# Patient Record
Sex: Female | Born: 1940 | Race: White | Hispanic: No | Marital: Married | Smoking: Never smoker
Health system: Southern US, Community
[De-identification: ages and names within clinical notes are randomized; demographics above are authoritative.]

## PROBLEM LIST (undated history)

## (undated) DIAGNOSIS — I4901 Ventricular fibrillation: Secondary | ICD-10-CM

## (undated) DIAGNOSIS — I2510 Atherosclerotic heart disease of native coronary artery without angina pectoris: Secondary | ICD-10-CM

## (undated) DIAGNOSIS — J40 Bronchitis, not specified as acute or chronic: Secondary | ICD-10-CM

## (undated) DIAGNOSIS — R7303 Prediabetes: Secondary | ICD-10-CM

## (undated) DIAGNOSIS — M8580 Other specified disorders of bone density and structure, unspecified site: Secondary | ICD-10-CM

## (undated) DIAGNOSIS — E079 Disorder of thyroid, unspecified: Secondary | ICD-10-CM

## (undated) DIAGNOSIS — E039 Hypothyroidism, unspecified: Secondary | ICD-10-CM

## (undated) DIAGNOSIS — I214 Non-ST elevation (NSTEMI) myocardial infarction: Secondary | ICD-10-CM

## (undated) DIAGNOSIS — I10 Essential (primary) hypertension: Secondary | ICD-10-CM

## (undated) DIAGNOSIS — F419 Anxiety disorder, unspecified: Secondary | ICD-10-CM

## (undated) HISTORY — PX: CHOLECYSTECTOMY: SHX55

## (undated) HISTORY — DX: Disorder of thyroid, unspecified: E07.9

## (undated) HISTORY — PX: TONSILLECTOMY: SUR1361

## (undated) HISTORY — PX: CORONARY ANGIOPLASTY WITH STENT PLACEMENT: SHX49

## (undated) HISTORY — PX: APPENDECTOMY: SHX54

## (undated) HISTORY — DX: Other specified disorders of bone density and structure, unspecified site: M85.80

## (undated) HISTORY — DX: Essential (primary) hypertension: I10

---

## 2000-12-15 ENCOUNTER — Emergency Department (HOSPITAL_COMMUNITY): Admission: EM | Admit: 2000-12-15 | Discharge: 2000-12-15 | Payer: Self-pay | Admitting: Emergency Medicine

## 2000-12-20 ENCOUNTER — Encounter: Payer: Self-pay | Admitting: General Surgery

## 2000-12-20 ENCOUNTER — Observation Stay (HOSPITAL_COMMUNITY): Admission: RE | Admit: 2000-12-20 | Discharge: 2000-12-21 | Payer: Self-pay | Admitting: General Surgery

## 2000-12-20 ENCOUNTER — Encounter (INDEPENDENT_AMBULATORY_CARE_PROVIDER_SITE_OTHER): Payer: Self-pay | Admitting: Specialist

## 2001-12-21 ENCOUNTER — Encounter: Admission: RE | Admit: 2001-12-21 | Discharge: 2001-12-21 | Payer: Self-pay | Admitting: *Deleted

## 2001-12-21 ENCOUNTER — Encounter: Payer: Self-pay | Admitting: *Deleted

## 2002-05-05 ENCOUNTER — Other Ambulatory Visit: Admission: RE | Admit: 2002-05-05 | Discharge: 2002-05-05 | Payer: Self-pay | Admitting: Obstetrics and Gynecology

## 2002-07-17 ENCOUNTER — Encounter: Admission: RE | Admit: 2002-07-17 | Discharge: 2002-07-17 | Payer: Self-pay | Admitting: Internal Medicine

## 2002-07-17 ENCOUNTER — Encounter: Payer: Self-pay | Admitting: Internal Medicine

## 2003-09-18 ENCOUNTER — Emergency Department (HOSPITAL_COMMUNITY): Admission: EM | Admit: 2003-09-18 | Discharge: 2003-09-18 | Payer: Self-pay | Admitting: Emergency Medicine

## 2009-03-13 ENCOUNTER — Emergency Department (HOSPITAL_BASED_OUTPATIENT_CLINIC_OR_DEPARTMENT_OTHER): Admission: EM | Admit: 2009-03-13 | Discharge: 2009-03-13 | Payer: Self-pay | Admitting: Emergency Medicine

## 2009-03-13 ENCOUNTER — Ambulatory Visit: Payer: Self-pay | Admitting: Diagnostic Radiology

## 2009-09-19 ENCOUNTER — Ambulatory Visit: Payer: Self-pay | Admitting: Diagnostic Radiology

## 2009-09-19 ENCOUNTER — Emergency Department (HOSPITAL_BASED_OUTPATIENT_CLINIC_OR_DEPARTMENT_OTHER): Admission: EM | Admit: 2009-09-19 | Discharge: 2009-09-19 | Payer: Self-pay | Admitting: Emergency Medicine

## 2010-06-20 NOTE — Op Note (Signed)
[facility]  Patient:    Cathy Reilly, Cathy Reilly Visit Number: [id number] MRN: [id number]          Service Type: SUR Location: [id number] Attending Physician:  Carson Myrtle Dictated by:   Sheppard Plumber Earlene Plater, M.D. Proc. Date: 12/20/00 Admit Date:  12/20/2000                             Operative Report  PREOPERATIVE DIAGNOSIS:  Cholecystolithiasis.  POSTOPERATIVE DIAGNOSIS:  Cholecystolithiasis.  PROCEDURE:  Laparoscopic cholecystectomy and operative cholangiogram.  SURGEON:  Timothy E. Earlene Plater, M.D.  ANESTHESIA:  General.  INDICATIONS:  Cathy Reilly is a 59.  Has had some right upper quadrant pain, food related.  Had a severe attack last Wednesday in the emergency room that subsided and she is now ready to proceed with a laparoscopic cholecystectomy which has been carefully explained.  DESCRIPTION OF PROCEDURE:  The patient was seen in the holding area, evaluated by anesthesia.  Laboratory data today shows a slight elevation of the bilirubin.  The patient was taken to the operating room, placed supine.  General endotracheal anesthesia administered.  The abdomen was scrubbed, prepped and draped in the usual fashion.  Marcaine 25% with epinephrine is used throughout prior to each incision.  An infraumbilical incision was made horizontally. The fascia was identified, opened vertically.  The peritoneum was entered without complications.  The Hasson catheter was placed and tied in place with a #1 Vicryl.  The abdomen was insufflated.  Peritoneoscopy showed no changes. There were adhesions just inferior to the umbilicus.  A 10 mm trocar was placed in the mid epigastrium.  Two 5 mm trocars were placed in the right upper quadrant.  The gallbladder was slightly tense.  It was grasped at the dome, placed on tension.  The adhesions of the omentum were taken down bluntly.  The gallbladder was then viewed in its entirety.  The infundibulum of the gallbladder  was then grasped and careful dissection of the fatty tissue around the base of the gallbladder revealed a normal-appearing cystic duct entering the gallbladder.  Behind that was the anterior and posterior branch of the cystic artery.  The camera was placed in the mid epigastric position and the omental adhesions to the lower midline appeared rather innocent. After careful dissection of the base of the gallbladder, the junction of the gallbladder and cystic duct was clipped.  Small incision made in the cystic duct and then the _____ catheter was inserted into the remnant of the cystic duct and clamped in place.  A real time cholangiogram was made by injecting half-strength Hypaque.  The biliary system filled rapidly and completely.  I detected no foreign body, evidence of stone or abnormalities.  Likewise it flowed distally and into the duodenum without apparent abnormality.  With this, the tip and catheter were removed.  The stump of the cystic duct was triply clipped and then the duct was divided through.  Both the anterior and posterior branches of the cystic artery were individually dissected, triply clipped and divided.  The gallbladder was then dissected from the gallbladder bed without incident or complications.  There was no bile leak and no bleeding.  Careful inspection and irrigation were carried out.  The gallbladder was then removed through the infraumbilical incision.  It was passed off the field and submitted for pathology.  Irrigant was clear.  The bed of the gallbladder inspected.  No abnormalities noted.  The adhesions to the lower midline were bluntly taken down and freed from the anterior abdominal wall.  There was no bleeding.  With this, the procedure was complete.  All irrigant, CO2, instruments and trocars were removed.  The abdomen was cleansed and the larger incisions closed with interrupted 3-0 Monocryl and all incisions closed with Steri-Strips.  She tolerated it  well. Was awakened and taken to the recovery room in good condition. Dictated by:   Sheppard Plumber Earlene Plater, M.D. Attending Physician:  Carson Myrtle DD:  12/20/00 TD:  12/20/00 Job: [phone]

## 2014-01-11 ENCOUNTER — Ambulatory Visit (INDEPENDENT_AMBULATORY_CARE_PROVIDER_SITE_OTHER): Payer: Commercial Managed Care - HMO | Admitting: Internal Medicine

## 2014-01-11 ENCOUNTER — Encounter: Payer: Self-pay | Admitting: Internal Medicine

## 2014-01-11 VITALS — BP 154/66 | HR 69 | Ht 61.0 in | Wt 154.2 lb

## 2014-01-11 DIAGNOSIS — R0989 Other specified symptoms and signs involving the circulatory and respiratory systems: Secondary | ICD-10-CM

## 2014-01-11 NOTE — Patient Instructions (Signed)
Your physician recommends that you continue on your current medications as directed. Please refer to the Current Medication list given to you today. Your physician has requested that you have a carotid duplex. This test is an ultrasound of the carotid arteries in your neck. It looks at blood flow through these arteries that supply the brain with blood. Allow one hour for this exam. There are no restrictions or special instructions.

## 2014-01-11 NOTE — Progress Notes (Signed)
HPI Patinet is a 73 yo who was referred for evaluation of dyslipidemia THe patient was seen recently in clinic  Chol panel showed LDL171 Trig 158  HDL 41  Total chol 243 Patient has a history of HTN (treated),hypothyroidism She denies CP  No SOB  No palpitations  Active  Patient reports trying crestor, lipitro smvistatin and welchol in past  PRob with aches Cathy Reilly  Not sure what rxn was  It was expensive Allergies  Allergen Reactions  . Codeine   . Crestor [Rosuvastatin] Other (See Comments)    myaglia  . Lipitor [Atorvastatin] Other (See Comments)    myaglia  . Septra [Sulfamethoxazole-Trimethoprim]   . Simvastatin Other (See Comments)    myaglia  . Welchol [Colesevelam Hcl] Other (See Comments)    myaglia    Current Outpatient Prescriptions  Medication Sig Dispense Refill  . amLODipine (NORVASC) 5 MG tablet Take 5 mg by mouth daily.    Marland Kitchen aspirin 81 MG tablet Take 81 mg by mouth daily.    . calcium citrate-vitamin D (CITRACAL+D) 315-200 MG-UNIT per tablet Take 1 tablet by mouth 2 (two) times daily.    . cholecalciferol (VITAMIN D) 1000 UNITS tablet Take 1,000 Units by mouth daily.    . Coenzyme Q10 (CO Q-10) 100 MG CAPS Take by mouth.    . levothyroxine (SYNTHROID, LEVOTHROID) 75 MCG tablet Take 75 mcg by mouth daily before breakfast.    . lisinopril-hydrochlorothiazide (PRINZIDE,ZESTORETIC) 20-12.5 MG per tablet Take 1 tablet by mouth daily.    . Omega 3 1000 MG CAPS Take by mouth.     No current facility-administered medications for this visit.    Past Medical History  Diagnosis Date  . Hypertension   . Thyroid disease   . Osteopenia     hip    Past Surgical History  Procedure Laterality Date  . Cholecystectomy    . Appendectomy    . Tonsillectomy      Family History  Problem Relation Age of Onset  . Hypertension Mother   . Hypertension Father   . Hypertension Sister   . Cancer Sister   . Hypertension Brother   . Cancer Brother   . CVA Brother   .  Transient ischemic attack Sister     History   Social History  . Marital Status: Married    Spouse Name: N/A    Number of Children: N/A  . Years of Education: N/A   Occupational History  . Not on file.   Social History Main Topics  . Smoking status: Never Smoker   . Smokeless tobacco: Not on file  . Alcohol Use: Not on file  . Drug Use: Not on file  . Sexual Activity: Not on file   Other Topics Concern  . Not on file   Social History Narrative    Review of Systems:  All systems reviewed.  They are negative to the above problem except as previously stated.  Vital Signs: BP 154/66 mmHg  Pulse 69  Ht 5\' 1"  (1.549 m)  Wt 154 lb 3.2 oz (69.945 kg)  BMI 29.15 kg/m2  SpO2 98%  Physical Exam  HEENT:  Normocephalic, atraumatic. EOMI, PERRLA.  Neck: JVP is normal.  No bruits.  Lungs: clear to auscultation. No rales no wheezes.  Heart: Regular rate and rhythm. Normal S1, S2. No S3.   No significant murmurs. PMI not displaced.  Abdomen:  Supple, nontender. Normal bowel sounds. No masses. No hepatomegaly.  Extremities:   Good distal  pulses throughout. No lower extremity edema.  Musculoskeletal :moving all extremities.  Neuro:   alert and oriented x3.  CN II-XII grossly intact.  EKG  06/09/13:  SR  64 bpm  Assessment and Plan:  1.  Dyslipidemia  I have reviewed previous xrays  She had a CXR in past that showed calcification of the aortic knob  THis is sugg of atherosclerosis I would recomm carotid USN to confirm If indeed plaquing will need evaluate Rx  2.  HTN  BP today is higher than usual she says  It was good in medicine clnic  Follow  F/U to be determinedcc

## 2014-01-17 ENCOUNTER — Ambulatory Visit (HOSPITAL_COMMUNITY): Payer: Commercial Managed Care - HMO | Attending: Cardiology | Admitting: *Deleted

## 2014-01-17 DIAGNOSIS — E785 Hyperlipidemia, unspecified: Secondary | ICD-10-CM | POA: Insufficient documentation

## 2014-01-17 DIAGNOSIS — I6523 Occlusion and stenosis of bilateral carotid arteries: Secondary | ICD-10-CM | POA: Insufficient documentation

## 2014-01-17 DIAGNOSIS — R0989 Other specified symptoms and signs involving the circulatory and respiratory systems: Secondary | ICD-10-CM | POA: Diagnosis not present

## 2014-01-17 NOTE — Progress Notes (Signed)
Carotid duplex complete 

## 2014-03-01 DIAGNOSIS — J0100 Acute maxillary sinusitis, unspecified: Secondary | ICD-10-CM | POA: Diagnosis not present

## 2014-03-23 ENCOUNTER — Encounter (HOSPITAL_COMMUNITY): Payer: Self-pay | Admitting: Emergency Medicine

## 2014-03-23 ENCOUNTER — Emergency Department (HOSPITAL_COMMUNITY)
Admission: EM | Admit: 2014-03-23 | Discharge: 2014-03-23 | Disposition: A | Discharge status: Home / Self Care | Payer: Commercial Managed Care - HMO | Attending: Emergency Medicine | Admitting: Emergency Medicine

## 2014-03-23 ENCOUNTER — Emergency Department (HOSPITAL_COMMUNITY): Payer: Commercial Managed Care - HMO

## 2014-03-23 DIAGNOSIS — I10 Essential (primary) hypertension: Secondary | ICD-10-CM | POA: Diagnosis not present

## 2014-03-23 DIAGNOSIS — S52572A Other intraarticular fracture of lower end of left radius, initial encounter for closed fracture: Secondary | ICD-10-CM | POA: Diagnosis not present

## 2014-03-23 DIAGNOSIS — M8580 Other specified disorders of bone density and structure, unspecified site: Secondary | ICD-10-CM | POA: Diagnosis not present

## 2014-03-23 DIAGNOSIS — Y9389 Activity, other specified: Secondary | ICD-10-CM | POA: Insufficient documentation

## 2014-03-23 DIAGNOSIS — Y9289 Other specified places as the place of occurrence of the external cause: Secondary | ICD-10-CM | POA: Diagnosis not present

## 2014-03-23 DIAGNOSIS — S6992XA Unspecified injury of left wrist, hand and finger(s), initial encounter: Secondary | ICD-10-CM | POA: Diagnosis present

## 2014-03-23 DIAGNOSIS — E079 Disorder of thyroid, unspecified: Secondary | ICD-10-CM | POA: Diagnosis not present

## 2014-03-23 DIAGNOSIS — E039 Hypothyroidism, unspecified: Secondary | ICD-10-CM | POA: Diagnosis not present

## 2014-03-23 DIAGNOSIS — S52592A Other fractures of lower end of left radius, initial encounter for closed fracture: Secondary | ICD-10-CM | POA: Diagnosis not present

## 2014-03-23 DIAGNOSIS — Z792 Long term (current) use of antibiotics: Secondary | ICD-10-CM | POA: Diagnosis not present

## 2014-03-23 DIAGNOSIS — S52502A Unspecified fracture of the lower end of left radius, initial encounter for closed fracture: Secondary | ICD-10-CM

## 2014-03-23 DIAGNOSIS — Z79899 Other long term (current) drug therapy: Secondary | ICD-10-CM | POA: Insufficient documentation

## 2014-03-23 DIAGNOSIS — Z7982 Long term (current) use of aspirin: Secondary | ICD-10-CM | POA: Diagnosis not present

## 2014-03-23 DIAGNOSIS — Y998 Other external cause status: Secondary | ICD-10-CM | POA: Diagnosis not present

## 2014-03-23 DIAGNOSIS — W01198A Fall on same level from slipping, tripping and stumbling with subsequent striking against other object, initial encounter: Secondary | ICD-10-CM | POA: Diagnosis not present

## 2014-03-23 DIAGNOSIS — S52571A Other intraarticular fracture of lower end of right radius, initial encounter for closed fracture: Secondary | ICD-10-CM | POA: Diagnosis not present

## 2014-03-23 MED ORDER — IBUPROFEN 800 MG PO TABS
800.0000 mg | ORAL_TABLET | Freq: Once | ORAL | Status: AC
  Administered 2014-03-23: 800 mg via ORAL
  Filled 2014-03-23: qty 1

## 2014-03-23 MED ORDER — HYDROCODONE-ACETAMINOPHEN 5-325 MG PO TABS: 2.0000 | ORAL_TABLET | ORAL | Status: DC | PRN

## 2014-03-23 NOTE — ED Provider Notes (Signed)
CSN: [id number]     Arrival date & time 03/23/14  1601 History   First MD Initiated Contact with Patient 03/23/14 1737     Chief Complaint  Patient presents with  . Fall  . Arm Pain   HPI  Patient is a 74 year old female with past medical history of hypertension, thyroid disease, and osteopenia in her shortness who presents to the emergency room for evaluation of left wrist pain. Patient states that she was playing with her grandson when her dog knocked her over. She caught herself on an outstretched arm. Patient is right-hand dominant. She noticed some swelling after falling and some constant aching pain. Her pain is a 7 out of 10 with movement. She states that it does not hurt that bad when she does not move it. Patient denies tingling or numbness or weakness. Patient has not tried any relieving factors since the incident. This happened approximately an hour before coming in.   Past Medical History  Diagnosis Date  . Hypertension   . Thyroid disease   . Osteopenia     hip   Past Surgical History  Procedure Laterality Date  . Cholecystectomy    . Appendectomy    . Tonsillectomy     Family History  Problem Relation Age of Onset  . Hypertension Mother   . Hypertension Father   . Hypertension Sister   . Cancer Sister   . Hypertension Brother   . Cancer Brother   . CVA Brother   . Transient ischemic attack Sister    History  Substance Use Topics  . Smoking status: Never Smoker   . Smokeless tobacco: Not on file  . Alcohol Use: Not on file   OB History    No data available     Review of Systems  Musculoskeletal: Positive for joint swelling and arthralgias.  Skin: Negative for color change.  Neurological: Negative for numbness.  All other systems reviewed and are negative.     Allergies  Codeine; Crestor; Lipitor; Septra; Simvastatin; and Welchol  Home Medications   Prior to Admission medications   Medication Sig Start Date End Date Taking? Authorizing Provider   amLODipine (NORVASC) 5 MG tablet Take 5 mg by mouth daily.   Yes Historical Provider, MD  aspirin 81 MG tablet Take 81 mg by mouth daily.   Yes Historical Provider, MD  calcium citrate-vitamin D (CITRACAL+D) 315-200 MG-UNIT per tablet Take 1 tablet by mouth 2 (two) times daily.   Yes Historical Provider, MD  cholecalciferol (VITAMIN D) 1000 UNITS tablet Take 1,000 Units by mouth daily.   Yes Historical Provider, MD  Coenzyme Q10 (CO Q-10) 100 MG CAPS Take by mouth.   Yes Historical Provider, MD  levothyroxine (SYNTHROID, LEVOTHROID) 75 MCG tablet Take 75 mcg by mouth daily before breakfast.   Yes Historical Provider, MD  lisinopril-hydrochlorothiazide (PRINZIDE,ZESTORETIC) 20-12.5 MG per tablet Take 1 tablet by mouth daily.   Yes Historical Provider, MD  Omega 3 1000 MG CAPS Take by mouth.   Yes Historical Provider, MD  azithromycin (ZITHROMAX) 250 MG tablet Take 250 mg by mouth daily.    Historical Provider, MD  HYDROcodone-acetaminophen (NORCO/VICODIN) 5-325 MG per tablet Take 2 tablets by mouth every 4 (four) hours as needed for moderate pain or severe pain. 03/23/14   Beyonce Sawatzky A Forcucci, PA-C   BP 150/83 mmHg  Pulse 70  Temp(Src) 98.3 F (36.8 C) (Oral)  Resp 16  SpO2 98% Physical Exam  Constitutional: She is oriented to person, place, and  time. She appears well-developed and well-nourished. No distress.  HENT:  Head: Normocephalic and atraumatic.  Mouth/Throat: Oropharynx is clear and moist. No oropharyngeal exudate.  Eyes: Conjunctivae are normal. No scleral icterus.  Neck: Normal range of motion. Neck supple.  Cardiovascular: Normal rate, regular rhythm, normal heart sounds and intact distal pulses.  Exam reveals no gallop and no friction rub.   No murmur heard. Pulses:      Radial pulses are 2+ on the left side.  Pulmonary/Chest: Effort normal and breath sounds normal. No respiratory distress. She has no wheezes. She has no rales. She exhibits no tenderness.  Musculoskeletal:        Left wrist: She exhibits decreased range of motion, bony tenderness, crepitus and deformity. She exhibits no tenderness, no swelling, no effusion and no laceration.       Left hand: She exhibits normal range of motion and normal capillary refill. Normal sensation noted. Normal strength noted.  There is swelling over the left distal radius. Left distal radius is most tender to palpation. There is no palpable crepitus. Patient has mildly limited range of motion of the left wrist. She has full range of motion of the left hand.  Neurological: She is alert and oriented to person, place, and time. She has normal strength. No cranial nerve deficit or sensory deficit. Coordination normal.  Skin: Skin is warm and dry. She is not diaphoretic.  Psychiatric: She has a normal mood and affect. Her behavior is normal. Judgment and thought content normal.  Nursing note and vitals reviewed.   ED Course  Procedures (including critical care time) Labs Review Labs Reviewed - No data to display  Imaging Review Dg Wrist Complete Left  03/23/2014   CLINICAL DATA:  Fall  EXAM: LEFT WRIST - COMPLETE 3+ VIEW  COMPARISON:  None.  FINDINGS: There is a slightly depressed, comminuted intra-articular fracture involving the distal radius. No dislocation.  IMPRESSION: 1. Comminuted intra-articular fracture involves the distal radius.   Electronically Signed   By: Kerby Moors M.D.   On: 03/23/2014 17:10     EKG Interpretation None      MDM   Final diagnoses:  Distal radial fracture, left, closed, initial encounter   Patient 74 year old female who presents emergency room for evaluation of left wrist pain after fall today. Physical exam reveals neurovascularly intact left wrist with a deformity. Plain film x-ray reveals a comminuted intra-articular fracture of the distal radius. I spoke with Dr. Blanche East from hand surgery who will see the patient in the office on Monday. He is recommended a sugar tong splint. I will  give the patient Vicodin as needed for pain control. If she does not want to take the ibuprofen or Tylenol except well. Patient to return for symptoms of compartment syndrome which we discussed. Patient states understanding and agreement at this time. Patient seen by and discussed with Dr. Cathleen Fears who agrees the above workup and plan.    Cherylann Parr, PA-C 03/23/14 Amada Acres, MD 03/24/14 [phone]

## 2014-03-23 NOTE — ED Provider Notes (Signed)
Complains of left wrist pain after she was knocked over by her dog 3 PM today. She states she struck her head on the ground however denies headache denies neck pain denies loss of consciousness no other complaint. On exam alert no distress Glasgow Coma Score 15 HEENT exam normocephalic atraumatic ears normal neck supple no tenderness left upper extremity tender at wrist. No snuffbox tenderness. Radial pulse 2+. Good capillary refill to all fingers. X-ray viewed by me  Orlie Dakin, MD 03/23/14 [phone]

## 2014-03-23 NOTE — Discharge Instructions (Signed)
Cast or Splint Care °Casts and splints support injured limbs and keep bones from moving while they heal. It is important to care for your cast or splint at home.   °HOME CARE INSTRUCTIONS °· Keep the cast or splint uncovered during the drying period. It can take 24 to 48 hours to dry if it is made of plaster. A fiberglass cast will dry in less than 1 hour. °· Do not rest the cast on anything harder than a pillow for the first 24 hours. °· Do not put weight on your injured limb or apply pressure to the cast until your health care provider gives you permission. °· Keep the cast or splint dry. Wet casts or splints can lose their shape and may not support the limb as well. A wet cast that has lost its shape can also create harmful pressure on your skin when it dries. Also, wet skin can become infected. °· Cover the cast or splint with a plastic bag when bathing or when out in the rain or snow. If the cast is on the trunk of the body, take sponge baths until the cast is removed. °· If your cast does become wet, dry it with a towel or a blow dryer on the cool setting only. °· Keep your cast or splint clean. Soiled casts may be wiped with a moistened cloth. °· Do not place any hard or soft foreign objects under your cast or splint, such as cotton, toilet paper, lotion, or powder. °· Do not try to scratch the skin under the cast with any object. The object could get stuck inside the cast. Also, scratching could lead to an infection. If itching is a problem, use a blow dryer on a cool setting to relieve discomfort. °· Do not trim or cut your cast or remove padding from inside of it. °· Exercise all joints next to the injury that are not immobilized by the cast or splint. For example, if you have a long leg cast, exercise the hip joint and toes. If you have an arm cast or splint, exercise the shoulder, elbow, thumb, and fingers. °· Elevate your injured arm or leg on 1 or 2 pillows for the first 1 to 3 days to decrease  swelling and pain. It is best if you can comfortably elevate your cast so it is higher than your heart. °SEEK MEDICAL CARE IF:  °· Your cast or splint cracks. °· Your cast or splint is too tight or too loose. °· You have unbearable itching inside the cast. °· Your cast becomes wet or develops a soft spot or area. °· You have a bad smell coming from inside your cast. °· You get an object stuck under your cast. °· Your skin around the cast becomes red or raw. °· You have new pain or worsening pain after the cast has been applied. °SEEK IMMEDIATE MEDICAL CARE IF:  °· You have fluid leaking through the cast. °· You are unable to move your fingers or toes. °· You have discolored (blue or white), cool, painful, or very swollen fingers or toes beyond the cast. °· You have tingling or numbness around the injured area. °· You have severe pain or pressure under the cast. °· You have any difficulty with your breathing or have shortness of breath. °· You have chest pain. °Document Released: 01/17/2000 Document Revised: 11/09/2012 Document Reviewed: 07/28/2012 °Exit[employer]® Patient Information ©2015 [employer]. This information is not intended to replace advice given to you by your health care   provider. Make sure you discuss any questions you have with your health care provider. ° °Wrist Fracture °A wrist fracture is a break or crack in one of the bones of your wrist. Your wrist is made up of eight small bones at the palm of your hand (carpal bones) and two long bones that make up your forearm (radius and ulna).  °CAUSES  °· A direct blow to the wrist. °· Falling on an outstretched hand. °· Trauma, such as a car accident or a fall. °RISK FACTORS °Risk factors for wrist fracture include:  °· Participating in contact and high-risk sports, such as skiing, biking, and ice skating. °· Taking steroid medicines. °· Smoking. °· Being female. °· Being Caucasian. °· Drinking more than three alcoholic beverages per day. °· Having low or  lowered bone density (osteoporosis or osteopenia). °· Age. Older adults have decreased bone density. °· Women who have had menopause. °· History of previous fractures. °SIGNS AND SYMPTOMS °Symptoms of wrist fractures include tenderness, bruising, and inflammation. Additionally, the wrist may hang in an odd position or appear deformed.  °DIAGNOSIS °Diagnosis may include: °· Physical exam. °· X-ray. °TREATMENT °Treatment depends on many factors, including the nature and location of the fracture, your age, and your activity level. Treatment for wrist fracture can be nonsurgical or surgical.  °Nonsurgical Treatment °A plaster cast or splint may be applied to your wrist if the bone is in a good position. If the fracture is not in good position, it may be necessary for your health care provider to realign it before applying a splint or cast. Usually, a cast or splint will be worn for several weeks.  °Surgical Treatment °Sometimes the position of the bone is so far out of place that surgery is required to apply a device to hold it together as it heals. Depending on the fracture, there are a number of options for holding the bone in place while it heals, such as a cast and metal pins.   °HOME CARE INSTRUCTIONS °· Keep your injured wrist elevated and move your fingers as much as possible. °· Do not put pressure on any part of your cast or splint. It may break.   °· Use a plastic bag to protect your cast or splint from water while bathing or showering. Do not lower your cast or splint into water. °· Take medicines only as directed by your health care provider. °· Keep your cast or splint clean and dry. If it becomes wet, damaged, or suddenly feels too tight, contact your health care provider right away. °· Do not use any tobacco products including cigarettes, chewing tobacco, or electronic cigarettes. Tobacco can delay bone healing. If you need help quitting, ask your health care provider. °· Keep all follow-up visits as  directed by your health care provider. This is important. °· Ask your health care provider if you should take supplements of calcium and vitamins C and D to promote bone healing. °SEEK MEDICAL CARE IF:  °· Your cast or splint is damaged, breaks, or gets wet. °· You have a fever. °· You have chills. °· You have continued severe pain or more swelling than you did before the cast was put on. °SEEK IMMEDIATE MEDICAL CARE IF:  °· Your hand or fingernails on the injured arm turn blue or gray, or feel cold or numb. °· You have decreased feeling in the fingers of your injured arm. °MAKE SURE YOU: °· Understand these instructions. °· Will watch your condition. °· Will get help   right away if you are not doing well or get worse. °Document Released: 10/29/2004 Document Revised: 06/05/2013 Document Reviewed: 02/06/2011 °ExitCare® Patient Information ©2015 [employer]. This information is not intended to replace advice given to you by your health care provider. Make sure you discuss any questions you have with your health care provider. ° °

## 2014-03-23 NOTE — ED Notes (Signed)
Pt reports she fell an hour ago, when her dog tripped her. Landed on grass. Complains of L wrist pain, broke same wrist when she was 74 y/o. Pt hit head on ground, no LOC. Not on blood thinners.

## 2014-03-24 NOTE — Progress Notes (Signed)
CSW met with pt at bedside. Husband was present. Pt states that she did not fall. Pt says that her dog ran behind her and knocked her off her feet. Pt confirms that she presents to [facility] due to L wrist pain.  Pt informed CSW that she lives in home in [place] with her husband. Pt states that she is not interested in possibly going to a facility. Pt states that she feels a little better than when she first came in to the ED.  Husband/Jimmy Ohalloran [phone]  Willette Brace [phone] ED CSW 03/24/2014 12:19 AM

## 2014-03-29 DIAGNOSIS — S52571A Other intraarticular fracture of lower end of right radius, initial encounter for closed fracture: Secondary | ICD-10-CM | POA: Diagnosis not present

## 2014-04-05 DIAGNOSIS — S52571A Other intraarticular fracture of lower end of right radius, initial encounter for closed fracture: Secondary | ICD-10-CM | POA: Diagnosis not present

## 2014-04-14 DIAGNOSIS — Z0100 Encounter for examination of eyes and vision without abnormal findings: Secondary | ICD-10-CM | POA: Diagnosis not present

## 2014-05-03 DIAGNOSIS — S52571A Other intraarticular fracture of lower end of right radius, initial encounter for closed fracture: Secondary | ICD-10-CM | POA: Diagnosis not present

## 2014-05-25 DIAGNOSIS — E039 Hypothyroidism, unspecified: Secondary | ICD-10-CM | POA: Diagnosis not present

## 2014-05-28 DIAGNOSIS — S52571D Other intraarticular fracture of lower end of right radius, subsequent encounter for closed fracture with routine healing: Secondary | ICD-10-CM | POA: Diagnosis not present

## 2014-05-28 DIAGNOSIS — S52571A Other intraarticular fracture of lower end of right radius, initial encounter for closed fracture: Secondary | ICD-10-CM | POA: Diagnosis not present

## 2014-08-22 DIAGNOSIS — M8580 Other specified disorders of bone density and structure, unspecified site: Secondary | ICD-10-CM | POA: Diagnosis not present

## 2014-08-22 DIAGNOSIS — Z0000 Encounter for general adult medical examination without abnormal findings: Secondary | ICD-10-CM | POA: Diagnosis not present

## 2014-08-22 DIAGNOSIS — E039 Hypothyroidism, unspecified: Secondary | ICD-10-CM | POA: Diagnosis not present

## 2014-08-22 DIAGNOSIS — Z1211 Encounter for screening for malignant neoplasm of colon: Secondary | ICD-10-CM | POA: Diagnosis not present

## 2014-08-22 DIAGNOSIS — I10 Essential (primary) hypertension: Secondary | ICD-10-CM | POA: Diagnosis not present

## 2014-08-22 DIAGNOSIS — E782 Mixed hyperlipidemia: Secondary | ICD-10-CM | POA: Diagnosis not present

## 2014-08-22 DIAGNOSIS — Z1389 Encounter for screening for other disorder: Secondary | ICD-10-CM | POA: Diagnosis not present

## 2014-12-04 DIAGNOSIS — J40 Bronchitis, not specified as acute or chronic: Secondary | ICD-10-CM

## 2014-12-04 DIAGNOSIS — I214 Non-ST elevation (NSTEMI) myocardial infarction: Secondary | ICD-10-CM

## 2014-12-04 HISTORY — DX: Bronchitis, not specified as acute or chronic: J40

## 2014-12-04 HISTORY — DX: Non-ST elevation (NSTEMI) myocardial infarction: I21.4

## 2014-12-20 ENCOUNTER — Encounter (HOSPITAL_COMMUNITY): Payer: Self-pay | Admitting: Emergency Medicine

## 2014-12-20 ENCOUNTER — Emergency Department (HOSPITAL_COMMUNITY): Payer: Commercial Managed Care - HMO

## 2014-12-20 ENCOUNTER — Encounter (HOSPITAL_COMMUNITY)
Admission: EM | Disposition: A | Discharge status: Home / Self Care | Payer: Commercial Managed Care - HMO | Source: Home / Self Care | Attending: Cardiology

## 2014-12-20 ENCOUNTER — Inpatient Hospital Stay (HOSPITAL_COMMUNITY): Payer: Commercial Managed Care - HMO

## 2014-12-20 ENCOUNTER — Inpatient Hospital Stay (HOSPITAL_COMMUNITY)
Admission: EM | Admit: 2014-12-20 | Discharge: 2014-12-24 | DRG: 246 | Disposition: A | Discharge status: Home / Self Care | Payer: Commercial Managed Care - HMO | Attending: Cardiology | Admitting: Cardiology

## 2014-12-20 DIAGNOSIS — I472 Ventricular tachycardia, unspecified: Secondary | ICD-10-CM | POA: Insufficient documentation

## 2014-12-20 DIAGNOSIS — I252 Old myocardial infarction: Secondary | ICD-10-CM | POA: Diagnosis not present

## 2014-12-20 DIAGNOSIS — I213 ST elevation (STEMI) myocardial infarction of unspecified site: Secondary | ICD-10-CM

## 2014-12-20 DIAGNOSIS — I214 Non-ST elevation (NSTEMI) myocardial infarction: Principal | ICD-10-CM | POA: Diagnosis present

## 2014-12-20 DIAGNOSIS — E039 Hypothyroidism, unspecified: Secondary | ICD-10-CM | POA: Diagnosis present

## 2014-12-20 DIAGNOSIS — E785 Hyperlipidemia, unspecified: Secondary | ICD-10-CM | POA: Diagnosis not present

## 2014-12-20 DIAGNOSIS — R0602 Shortness of breath: Secondary | ICD-10-CM | POA: Diagnosis not present

## 2014-12-20 DIAGNOSIS — Z955 Presence of coronary angioplasty implant and graft: Secondary | ICD-10-CM | POA: Diagnosis not present

## 2014-12-20 DIAGNOSIS — R7303 Prediabetes: Secondary | ICD-10-CM | POA: Diagnosis present

## 2014-12-20 DIAGNOSIS — I2542 Coronary artery dissection: Secondary | ICD-10-CM | POA: Diagnosis not present

## 2014-12-20 DIAGNOSIS — Z7982 Long term (current) use of aspirin: Secondary | ICD-10-CM | POA: Diagnosis not present

## 2014-12-20 DIAGNOSIS — E876 Hypokalemia: Secondary | ICD-10-CM | POA: Diagnosis not present

## 2014-12-20 DIAGNOSIS — I10 Essential (primary) hypertension: Secondary | ICD-10-CM | POA: Diagnosis not present

## 2014-12-20 DIAGNOSIS — I4901 Ventricular fibrillation: Secondary | ICD-10-CM | POA: Diagnosis not present

## 2014-12-20 DIAGNOSIS — I2510 Atherosclerotic heart disease of native coronary artery without angina pectoris: Secondary | ICD-10-CM | POA: Diagnosis present

## 2014-12-20 DIAGNOSIS — R079 Chest pain, unspecified: Secondary | ICD-10-CM | POA: Diagnosis present

## 2014-12-20 HISTORY — PX: CARDIAC CATHETERIZATION: SHX172

## 2014-12-20 LAB — CBC WITH DIFFERENTIAL/PLATELET
Basophils Absolute: 0 10*3/uL (ref 0.0–0.1)
Basophils Relative: 0 %
EOS ABS: 0.3 10*3/uL (ref 0.0–0.7)
Eosinophils Relative: 2 %
HEMATOCRIT: 42.9 % (ref 36.0–46.0)
HEMOGLOBIN: 14.5 g/dL (ref 12.0–15.0)
LYMPHS ABS: 2.4 10*3/uL (ref 0.7–4.0)
Lymphocytes Relative: 18 %
MCH: 30 pg (ref 26.0–34.0)
MCHC: 33.8 g/dL (ref 30.0–36.0)
MCV: 88.8 fL (ref 78.0–100.0)
Monocytes Absolute: 0.9 10*3/uL (ref 0.1–1.0)
Monocytes Relative: 7 %
NEUTROS ABS: 9.2 10*3/uL — AB (ref 1.7–7.7)
NEUTROS PCT: 73 %
Platelets: 369 10*3/uL (ref 150–400)
RBC: 4.83 MIL/uL (ref 3.87–5.11)
RDW: 12.8 % (ref 11.5–15.5)
WBC: 12.8 10*3/uL — AB (ref 4.0–10.5)

## 2014-12-20 LAB — BASIC METABOLIC PANEL
Anion gap: 8 (ref 5–15)
BUN: 12 mg/dL (ref 6–20)
CHLORIDE: 100 mmol/L — AB (ref 101–111)
CO2: 28 mmol/L (ref 22–32)
Calcium: 9.9 mg/dL (ref 8.9–10.3)
Creatinine, Ser: 0.79 mg/dL (ref 0.44–1.00)
GFR calc non Af Amer: >60 mL/min (ref >60)
Glucose, Bld: 138 mg/dL — ABNORMAL HIGH (ref 65–99)
POTASSIUM: 3.9 mmol/L (ref 3.5–5.1)
SODIUM: 136 mmol/L (ref 135–145)

## 2014-12-20 LAB — PROTIME-INR
INR: 1.1 (ref 0.00–1.49)
PROTHROMBIN TIME: 14.4 s (ref 11.6–15.2)

## 2014-12-20 LAB — I-STAT TROPONIN, ED: TROPONIN I, POC: 3.4 ng/mL — AB (ref 0.00–0.08)

## 2014-12-20 LAB — BRAIN NATRIURETIC PEPTIDE: B NATRIURETIC PEPTIDE 5: 412.5 pg/mL — AB (ref 0.0–100.0)

## 2014-12-20 LAB — POCT ACTIVATED CLOTTING TIME
Activated Clotting Time: 595 seconds
Activated Clotting Time: 128 seconds

## 2014-12-20 LAB — MRSA PCR SCREENING
MRSA by PCR: NEGATIVE
MRSA by PCR: NEGATIVE

## 2014-12-20 LAB — TROPONIN I
TROPONIN I: 55.11 ng/mL — AB (ref <0.031)
Troponin I: 5.31 ng/mL (ref <0.031)
Troponin I: 56.64 ng/mL (ref <0.031)

## 2014-12-20 SURGERY — LEFT HEART CATH AND CORONARY ANGIOGRAPHY

## 2014-12-20 MED ORDER — HEPARIN SODIUM (PORCINE) 1000 UNIT/ML IJ SOLN
INTRAMUSCULAR | Status: AC
  Filled 2014-12-20: qty 1

## 2014-12-20 MED ORDER — BIVALIRUDIN 250 MG IV SOLR
INTRAVENOUS | Status: AC
  Filled 2014-12-20: qty 250

## 2014-12-20 MED ORDER — HEPARIN (PORCINE) IN NACL 100-0.45 UNIT/ML-% IJ SOLN: 750.0000 [IU]/h | INTRAMUSCULAR | Status: DC

## 2014-12-20 MED ORDER — FENTANYL CITRATE (PF) 100 MCG/2ML IJ SOLN
INTRAMUSCULAR | Status: DC | PRN
  Administered 2014-12-20: 50 ug via INTRAVENOUS
  Administered 2014-12-20: 25 ug via INTRAVENOUS

## 2014-12-20 MED ORDER — FENTANYL CITRATE (PF) 100 MCG/2ML IJ SOLN
INTRAMUSCULAR | Status: AC
  Filled 2014-12-20: qty 2

## 2014-12-20 MED ORDER — NITROGLYCERIN IN D5W 200-5 MCG/ML-% IV SOLN
INTRAVENOUS | Status: AC
  Administered 2014-12-20: 50000 ug
  Filled 2014-12-20: qty 250

## 2014-12-20 MED ORDER — ACETAMINOPHEN 325 MG PO TABS: 650.0000 mg | ORAL_TABLET | ORAL | Status: DC | PRN

## 2014-12-20 MED ORDER — SODIUM CHLORIDE 0.9 % IV SOLN: 250.0000 mL | INTRAVENOUS | Status: DC | PRN

## 2014-12-20 MED ORDER — ASPIRIN 81 MG PO CHEW: 324.0000 mg | CHEWABLE_TABLET | ORAL | Status: DC

## 2014-12-20 MED ORDER — ONDANSETRON HCL 4 MG/2ML IJ SOLN: 4.0000 mg | Freq: Four times a day (QID) | INTRAMUSCULAR | Status: DC | PRN

## 2014-12-20 MED ORDER — SODIUM CHLORIDE 0.9 % IV SOLN
1.7500 mg/kg/h | INTRAVENOUS | Status: AC
  Administered 2014-12-20: 1.75 mg/kg/h via INTRAVENOUS
  Filled 2014-12-20: qty 250

## 2014-12-20 MED ORDER — SODIUM CHLORIDE 0.9 % IJ SOLN: 3.0000 mL | Freq: Two times a day (BID) | INTRAMUSCULAR | Status: DC

## 2014-12-20 MED ORDER — NITROGLYCERIN IN D5W 200-5 MCG/ML-% IV SOLN
0.0000 ug/min | INTRAVENOUS | Status: DC
  Administered 2014-12-20: 10 ug/min via INTRAVENOUS

## 2014-12-20 MED ORDER — ASPIRIN 81 MG PO CHEW
81.0000 mg | CHEWABLE_TABLET | ORAL | Status: AC
  Administered 2014-12-20: 81 mg via ORAL
  Filled 2014-12-20: qty 1

## 2014-12-20 MED ORDER — ASPIRIN EC 81 MG PO TBEC
81.0000 mg | DELAYED_RELEASE_TABLET | Freq: Every day | ORAL | Status: DC
  Administered 2014-12-21 – 2014-12-24 (×4): 81 mg via ORAL
  Filled 2014-12-20 (×4): qty 1

## 2014-12-20 MED ORDER — TICAGRELOR 90 MG PO TABS
90.0000 mg | ORAL_TABLET | Freq: Two times a day (BID) | ORAL | Status: DC
  Administered 2014-12-20 – 2014-12-24 (×8): 90 mg via ORAL
  Filled 2014-12-20 (×8): qty 1

## 2014-12-20 MED ORDER — TICAGRELOR 90 MG PO TABS
ORAL_TABLET | ORAL | Status: DC | PRN
  Administered 2014-12-20: 180 mg via ORAL

## 2014-12-20 MED ORDER — WHITE PETROLATUM GEL
Status: AC
  Administered 2014-12-20: 0.2
  Filled 2014-12-20: qty 1

## 2014-12-20 MED ORDER — NITROGLYCERIN 0.4 MG SL SUBL
SUBLINGUAL_TABLET | SUBLINGUAL | Status: AC
  Filled 2014-12-20: qty 1

## 2014-12-20 MED ORDER — LIDOCAINE HCL (PF) 1 % IJ SOLN
INTRAMUSCULAR | Status: DC | PRN
  Administered 2014-12-20: 11:00:00 via INTRA_ARTERIAL

## 2014-12-20 MED ORDER — SODIUM CHLORIDE 0.9 % WEIGHT BASED INFUSION
1.0000 mL/kg/h | INTRAVENOUS | Status: DC
  Administered 2014-12-20: 2.146 mL/kg/h via INTRAVENOUS

## 2014-12-20 MED ORDER — MIDAZOLAM HCL 2 MG/2ML IJ SOLN
INTRAMUSCULAR | Status: AC
  Filled 2014-12-20: qty 2

## 2014-12-20 MED ORDER — AMLODIPINE BESYLATE 5 MG PO TABS
5.0000 mg | ORAL_TABLET | Freq: Every day | ORAL | Status: DC
  Administered 2014-12-20 – 2014-12-24 (×5): 5 mg via ORAL
  Filled 2014-12-20 (×5): qty 1

## 2014-12-20 MED ORDER — MIDAZOLAM HCL 2 MG/2ML IJ SOLN
INTRAMUSCULAR | Status: DC | PRN
  Administered 2014-12-20: 2 mg via INTRAVENOUS
  Administered 2014-12-20: 1 mg via INTRAVENOUS

## 2014-12-20 MED ORDER — HEPARIN (PORCINE) IN NACL 100-0.45 UNIT/ML-% IJ SOLN
750.0000 [IU]/h | INTRAMUSCULAR | Status: DC
  Administered 2014-12-20: 750 [IU]/h via INTRAVENOUS
  Filled 2014-12-20: qty 250

## 2014-12-20 MED ORDER — HEPARIN (PORCINE) IN NACL 100-0.45 UNIT/ML-% IJ SOLN
750.0000 [IU]/h | INTRAMUSCULAR | Status: DC
  Administered 2014-12-21: 750 [IU]/h via INTRAVENOUS
  Filled 2014-12-20: qty 250

## 2014-12-20 MED ORDER — SODIUM CHLORIDE 0.9 % IJ SOLN
3.0000 mL | Freq: Two times a day (BID) | INTRAMUSCULAR | Status: DC
  Administered 2014-12-20 – 2014-12-24 (×7): 3 mL via INTRAVENOUS

## 2014-12-20 MED ORDER — NITROGLYCERIN 0.4 MG SL SUBL
0.4000 mg | SUBLINGUAL_TABLET | SUBLINGUAL | Status: DC | PRN
  Administered 2014-12-20: 0.4 mg via SUBLINGUAL

## 2014-12-20 MED ORDER — NITROGLYCERIN 1 MG/10 ML FOR IR/CATH LAB
INTRA_ARTERIAL | Status: AC
  Filled 2014-12-20: qty 10

## 2014-12-20 MED ORDER — ATROPINE SULFATE 0.1 MG/ML IJ SOLN
INTRAMUSCULAR | Status: AC
  Filled 2014-12-20: qty 10

## 2014-12-20 MED ORDER — HEPARIN BOLUS VIA INFUSION
3500.0000 [IU] | Freq: Once | INTRAVENOUS | Status: AC
  Administered 2014-12-20: 3500 [IU] via INTRAVENOUS
  Filled 2014-12-20: qty 3500

## 2014-12-20 MED ORDER — ASPIRIN EC 81 MG PO TBEC: 81.0000 mg | DELAYED_RELEASE_TABLET | Freq: Every day | ORAL | Status: DC

## 2014-12-20 MED ORDER — PRAVASTATIN SODIUM 40 MG PO TABS
80.0000 mg | ORAL_TABLET | Freq: Every day | ORAL | Status: DC
  Administered 2014-12-20 – 2014-12-24 (×5): 80 mg via ORAL
  Filled 2014-12-20 (×5): qty 2

## 2014-12-20 MED ORDER — PRAVASTATIN SODIUM 40 MG PO TABS: 40.0000 mg | ORAL_TABLET | Freq: Every day | ORAL | Status: DC

## 2014-12-20 MED ORDER — ATORVASTATIN CALCIUM 80 MG PO TABS: 80.0000 mg | ORAL_TABLET | Freq: Every day | ORAL | Status: DC

## 2014-12-20 MED ORDER — SODIUM CHLORIDE 0.9 % IJ SOLN: 3.0000 mL | INTRAMUSCULAR | Status: DC | PRN

## 2014-12-20 MED ORDER — ASPIRIN 300 MG RE SUPP: 300.0000 mg | RECTAL | Status: DC

## 2014-12-20 MED ORDER — ACETAMINOPHEN 325 MG PO TABS
650.0000 mg | ORAL_TABLET | ORAL | Status: DC | PRN
  Administered 2014-12-20: 650 mg via ORAL
  Filled 2014-12-20: qty 2

## 2014-12-20 MED ORDER — HEPARIN (PORCINE) IN NACL 2-0.9 UNIT/ML-% IJ SOLN
INTRAMUSCULAR | Status: AC
  Filled 2014-12-20: qty 1000

## 2014-12-20 MED ORDER — BIVALIRUDIN BOLUS VIA INFUSION - CUPID
INTRAVENOUS | Status: DC | PRN
  Administered 2014-12-20: 52.425 mg via INTRAVENOUS

## 2014-12-20 MED ORDER — SODIUM CHLORIDE 0.9 % IV SOLN
INTRAVENOUS | Status: DC
  Administered 2014-12-20 (×2): via INTRAVENOUS

## 2014-12-20 MED ORDER — ASPIRIN EC 81 MG PO TBEC
81.0000 mg | DELAYED_RELEASE_TABLET | Freq: Every day | ORAL | Status: DC
  Filled 2014-12-20: qty 1

## 2014-12-20 MED ORDER — NITROGLYCERIN IN D5W 200-5 MCG/ML-% IV SOLN
INTRAVENOUS | Status: DC | PRN
  Administered 2014-12-20: 10 ug/min via INTRAVENOUS

## 2014-12-20 MED ORDER — LIDOCAINE HCL (PF) 1 % IJ SOLN
INTRAMUSCULAR | Status: AC
  Filled 2014-12-20: qty 30

## 2014-12-20 MED ORDER — LISINOPRIL 5 MG PO TABS
5.0000 mg | ORAL_TABLET | Freq: Every day | ORAL | Status: DC
  Administered 2014-12-20 – 2014-12-24 (×5): 5 mg via ORAL
  Filled 2014-12-20 (×5): qty 1

## 2014-12-20 MED ORDER — SODIUM CHLORIDE 0.9 % WEIGHT BASED INFUSION
3.0000 mL/kg/h | INTRAVENOUS | Status: DC
  Administered 2014-12-20: 3 mL/kg/h via INTRAVENOUS

## 2014-12-20 MED ORDER — NITROGLYCERIN 1 MG/10 ML FOR IR/CATH LAB
INTRA_ARTERIAL | Status: DC | PRN
  Administered 2014-12-20: 13:00:00

## 2014-12-20 MED ORDER — LEVOTHYROXINE SODIUM 88 MCG PO TABS
88.0000 ug | ORAL_TABLET | Freq: Every day | ORAL | Status: DC
  Administered 2014-12-20 – 2014-12-24 (×5): 88 ug via ORAL
  Filled 2014-12-20 (×5): qty 1

## 2014-12-20 MED ORDER — FENTANYL CITRATE (PF) 100 MCG/2ML IJ SOLN
25.0000 ug | INTRAMUSCULAR | Status: DC | PRN
  Administered 2014-12-20: 25 ug via INTRAVENOUS
  Filled 2014-12-20: qty 2

## 2014-12-20 MED ORDER — VERAPAMIL HCL 2.5 MG/ML IV SOLN
INTRAVENOUS | Status: AC
  Filled 2014-12-20: qty 2

## 2014-12-20 MED ORDER — SODIUM CHLORIDE 0.9 % IJ SOLN
3.0000 mL | Freq: Two times a day (BID) | INTRAMUSCULAR | Status: DC
  Administered 2014-12-20 – 2014-12-23 (×5): 3 mL via INTRAVENOUS

## 2014-12-20 MED ORDER — SODIUM CHLORIDE 0.9 % IV SOLN
250.0000 mg | INTRAVENOUS | Status: DC | PRN
  Administered 2014-12-20 (×2): 1.75 mg/kg/h via INTRAVENOUS

## 2014-12-20 MED ORDER — NITROGLYCERIN 1 MG/10 ML FOR IR/CATH LAB
INTRA_ARTERIAL | Status: DC | PRN
  Administered 2014-12-20 (×2): 200 ug
  Administered 2014-12-20: 150 ug

## 2014-12-20 MED ORDER — NITROGLYCERIN 0.4 MG SL SUBL
0.4000 mg | SUBLINGUAL_TABLET | SUBLINGUAL | Status: AC | PRN
  Administered 2014-12-20 (×3): 0.4 mg via SUBLINGUAL
  Filled 2014-12-20: qty 1

## 2014-12-20 MED ORDER — ASPIRIN 325 MG PO TABS
325.0000 mg | ORAL_TABLET | Freq: Once | ORAL | Status: AC
  Administered 2014-12-20: 325 mg via ORAL
  Filled 2014-12-20: qty 1

## 2014-12-20 MED ORDER — METOPROLOL TARTRATE 25 MG PO TABS
25.0000 mg | ORAL_TABLET | Freq: Three times a day (TID) | ORAL | Status: DC
  Administered 2014-12-20 – 2014-12-24 (×14): 25 mg via ORAL
  Filled 2014-12-20 (×14): qty 1

## 2014-12-20 MED ORDER — LIDOCAINE HCL (PF) 1 % IJ SOLN
INTRAMUSCULAR | Status: DC | PRN
  Administered 2014-12-20: 3 mL
  Administered 2014-12-20: 20 mL

## 2014-12-20 SURGICAL SUPPLY — 22 items
BALLN EUPHORA RX 2.0X15 (BALLOONS) ×2
BALLN ~~LOC~~ EUPHORA RX 3.25X27 (BALLOONS) ×2
BALLOON EUPHORA RX 2.0X15 (BALLOONS) ×1 IMPLANT
BALLOON ~~LOC~~ EUPHORA RX 3.25X27 (BALLOONS) ×1 IMPLANT
CATH INFINITI 5FR MULTPACK ANG (CATHETERS) ×2 IMPLANT
CATH OPTITORQUE TIG 4.0 5F (CATHETERS) ×2 IMPLANT
CATH VISTA GUIDE 6FR XBLAD3.5 (CATHETERS) ×2 IMPLANT
DEVICE RAD COMP TR BAND LRG (VASCULAR PRODUCTS) ×2 IMPLANT
ELECT DEFIB PAD ADLT CADENCE (PAD) ×2 IMPLANT
GLIDESHEATH SLEND A-KIT 6F 22G (SHEATH) ×2 IMPLANT
KIT ENCORE 26 ADVANTAGE (KITS) ×2 IMPLANT
KIT HEART LEFT (KITS) ×2 IMPLANT
PACK CARDIAC CATHETERIZATION (CUSTOM PROCEDURE TRAY) ×2 IMPLANT
SHEATH PINNACLE 5F 10CM (SHEATH) ×2 IMPLANT
STENT XIENCE ALPINE RX 3.0X33 (Permanent Stent) ×2 IMPLANT
SYR MEDRAD MARK V 150ML (SYRINGE) ×2 IMPLANT
TRANSDUCER W/STOPCOCK (MISCELLANEOUS) ×2 IMPLANT
TUBING CIL FLEX 10 FLL-RA (TUBING) ×2 IMPLANT
WIRE COUGAR XT STRL 190CM (WIRE) ×2 IMPLANT
WIRE EMERALD 3MM-J .035X150CM (WIRE) ×2 IMPLANT
WIRE HI TORQ VERSACORE-J 145CM (WIRE) ×2 IMPLANT
WIRE SAFE-T 1.5MM-J .035X260CM (WIRE) ×2 IMPLANT

## 2014-12-20 NOTE — ED Provider Notes (Signed)
CSN: [id number]     Arrival date & time 12/20/14  0413 History   First MD Initiated Contact with Patient 12/20/14 [phone]     Chief Complaint  Patient presents with  . Chest Pain     (Consider location/radiation/quality/duration/timing/severity/associated sxs/prior Treatment) HPI  Cathy Reilly is a 74 y.o. female with past medical history of hypertension presenting today with chest pain. Patient states she has had worsening chest pain over the last week. Her pain radiates to her upper back, bilateral arms and bilateral jaw. She has shortness of breath as well. Patient denies vomiting or diaphoresis. She denies any history of ACS in the past. She has taken Maalox and GI medicine without any relief of her symptoms. Today she states her pain was significantly worse when laying down to go to sleep. She denies any fevers or recent infections. Patient has no further complaints.  10 Systems reviewed and are negative for acute change except as noted in the HPI.    Past Medical History  Diagnosis Date  . Hypertension   . Thyroid disease   . Osteopenia     hip   Past Surgical History  Procedure Laterality Date  . Cholecystectomy    . Appendectomy    . Tonsillectomy     Family History  Problem Relation Age of Onset  . Hypertension Mother   . Hypertension Father   . Hypertension Sister   . Cancer Sister   . Hypertension Brother   . Cancer Brother   . CVA Brother   . Transient ischemic attack Sister    Social History  Substance Use Topics  . Smoking status: Never Smoker   . Smokeless tobacco: None  . Alcohol Use: No   OB History    No data available     Review of Systems    Allergies  Codeine; Crestor; Lipitor; Septra; Simvastatin; and Welchol  Home Medications   Prior to Admission medications   Medication Sig Start Date End Date Taking? Authorizing Provider  amLODipine (NORVASC) 5 MG tablet Take 5 mg by mouth daily.    Historical Provider, MD  aspirin 81 MG tablet  Take 81 mg by mouth daily.    Historical Provider, MD  azithromycin (ZITHROMAX) 250 MG tablet Take 250 mg by mouth daily.    Historical Provider, MD  calcium citrate-vitamin D (CITRACAL+D) 315-200 MG-UNIT per tablet Take 1 tablet by mouth 2 (two) times daily.    Historical Provider, MD  cholecalciferol (VITAMIN D) 1000 UNITS tablet Take 1,000 Units by mouth daily.    Historical Provider, MD  Coenzyme Q10 (CO Q-10) 100 MG CAPS Take by mouth.    Historical Provider, MD  HYDROcodone-acetaminophen (NORCO/VICODIN) 5-325 MG per tablet Take 2 tablets by mouth every 4 (four) hours as needed for moderate pain or severe pain. 03/23/14   Courtney Forcucci, PA-C  levothyroxine (SYNTHROID, LEVOTHROID) 75 MCG tablet Take 75 mcg by mouth daily before breakfast.    Historical Provider, MD  lisinopril-hydrochlorothiazide (PRINZIDE,ZESTORETIC) 20-12.5 MG per tablet Take 1 tablet by mouth daily.    Historical Provider, MD  Omega 3 1000 MG CAPS Take by mouth.    Historical Provider, MD   BP 161/83 mmHg  Pulse 93  Temp(Src) 97.8 F (36.6 C) (Oral)  Resp 16  SpO2 93% Physical Exam  Constitutional: She is oriented to person, place, and time. She appears well-developed and well-nourished. No distress.  HENT:  Head: Normocephalic and atraumatic.  Nose: Nose normal.  Mouth/Throat: Oropharynx is clear and  moist. No oropharyngeal exudate.  Eyes: Conjunctivae and EOM are normal. Pupils are equal, round, and reactive to light. No scleral icterus.  Neck: Normal range of motion. Neck supple. No JVD present. No tracheal deviation present. No thyromegaly present.  Cardiovascular: Normal rate, regular rhythm and normal heart sounds.  Exam reveals no gallop and no friction rub.   No murmur heard. Pulmonary/Chest: Effort normal and breath sounds normal. No respiratory distress. She has no wheezes. She exhibits no tenderness.  Abdominal: Soft. Bowel sounds are normal. She exhibits no distension and no mass. There is no  tenderness. There is no rebound and no guarding.  Musculoskeletal: Normal range of motion. She exhibits no edema or tenderness.  Lymphadenopathy:    She has no cervical adenopathy.  Neurological: She is alert and oriented to person, place, and time. No cranial nerve deficit. She exhibits normal muscle tone.  Skin: Skin is warm and dry. No rash noted. No erythema. No pallor.  Nursing note and vitals reviewed.   ED Course  Procedures (including critical care time) Labs Review Labs Reviewed  CBC WITH DIFFERENTIAL/PLATELET - Abnormal; Notable for the following:    WBC 12.8 (*)    Neutro Abs 9.2 (*)    All other components within normal limits  BASIC METABOLIC PANEL - Abnormal; Notable for the following:    Chloride 100 (*)    Glucose, Bld 138 (*)    All other components within normal limits  I-STAT TROPOININ, ED - Abnormal; Notable for the following:    Troponin i, poc 3.40 (*)    All other components within normal limits  MRSA PCR SCREENING  HEPARIN LEVEL (UNFRACTIONATED)    Imaging Review Dg Chest 2 View  12/20/2014  CLINICAL DATA:  Chest pain and shortness of breath this morning. EXAM: CHEST  2 VIEW COMPARISON:  03/13/2009 FINDINGS: The cardiomediastinal contours are normal. There is atherosclerosis of the thoracic aorta. Pulmonary vasculature is normal. No consolidation, pleural effusion, or pneumothorax. No acute osseous abnormalities are seen. IMPRESSION: No acute pulmonary process. Electronically Signed   By: Jeb Levering M.D.   On: 12/20/2014 04:55   I have personally reviewed and evaluated these images and lab results as part of my medical decision-making.   EKG Interpretation   Date/Time:  Thursday December 20 2014 04:20:17 EST Ventricular Rate:  91 PR Interval:  120 QRS Duration: 91 QT Interval:  383 QTC Calculation: 471 R Axis:   94 Text Interpretation:  Sinus rhythm Right axis deviation new STD in  inferior leads Confirmed by Glynn Octave [phone]) on  12/20/2014  4:34:40 AM      MDM   Final diagnoses:  NSTEMI (non-ST elevated myocardial infarction) [facility])    Patient presents to the ED for chest pain.  History is concerning for ACS.  Troponin is 3.4 and EKG shows new STD in inferior leads.  Will page cardiology for admission.     CRITICAL CARE Performed by: Everlene Balls   Total critical care time: 40 minutes - NSTEMI  Critical care time was exclusive of separately billable procedures and treating other patients.  Critical care was necessary to treat or prevent imminent or life-threatening deterioration.  Critical care was time spent personally by me on the following activities: development of treatment plan with patient and/or surrogate as well as nursing, discussions with consultants, evaluation of patient's response to treatment, examination of patient, obtaining history from patient or surrogate, ordering and performing treatments and interventions, ordering and review of laboratory studies, ordering and  review of radiographic studies, pulse oximetry and re-evaluation of patient's condition.    Everlene Balls, MD 12/20/14 [phone]

## 2014-12-20 NOTE — Care Management Note (Signed)
Case Management Note  Patient Details  Name: [name] MRN: [id number] Date of Birth: 03-16-1940  Subjective/Objective:         Adm w mi           Action/Plan:lives w fam   Expected Discharge Date:                  Expected Discharge Plan:  Home/Self Care  In-House Referral:     Discharge planning Services  CM Consult  Post Acute Care Choice:    Choice offered to:     DME Arranged:    DME Agency:     HH Arranged:    [place] Agency:     Status of Service:  In process, will continue to follow  Medicare Important Message Given:    Date Medicare IM Given:    Medicare IM give by:    Date Additional Medicare IM Given:    Additional Medicare Important Message give by:     If discussed at [place] of Stay Meetings, dates discussed:    Additional Comments:gave pt 30day free brilinta card.  Lacretia Leigh, RN 12/20/2014, 3:41 PM

## 2014-12-20 NOTE — Care Management Note (Signed)
Case Management Note Marvetta Gibbons RN, BSN Unit 2W-Case Manager [phone] Covering 3W  Patient Details  Name: Cathy Reilly MRN: [id number] Date of Birth: 10-Sep-1940  Subjective/Objective:   Pt admitted with NSTEMI- plan for cardiac cath                 Action/Plan: PTA pt lived at home with spouse- anticipate return home- NCM to follow  Expected Discharge Date:                  Expected Discharge Plan:  Home/Self Care  In-House Referral:     Discharge planning Services  CM Consult  Post Acute Care Choice:    Choice offered to:     DME Arranged:    DME Agency:     HH Arranged:    [place] Agency:     Status of Service:  In process, will continue to follow  Medicare Important Message Given:    Date Medicare IM Given:    Medicare IM give by:    Date Additional Medicare IM Given:    Additional Medicare Important Message give by:     If discussed at [place] of Stay Meetings, dates discussed:    Additional Comments:  Dawayne Patricia, RN 12/20/2014, 10:12 AM

## 2014-12-20 NOTE — H&P (Signed)
Physician History and Physical    Cathy Reilly MRN: [id number] DOB/AGE: March 22, 1940 74 y.o. Admit date: 12/20/2014  Primary Care Physician: Carol Ada Primary Cardiologist: New  HPI: 74 yo with history of HTN and hypothyroidism was admitted with NSTEMI.  She has no prior cardiac history.  She did not have any significant exercise intolerance in the past. On Sunday, she had an episode of severe dyspnea while sitting on the couch.  This passed quickly.  On Monday, she had chest tightness in the central chest radiating down arms and to jaw.  This lasted most of the day.  It waxed and waned and eventually resolved in the evening.  She did ok on Tuesday until she was getting ready for bed.  The same chest pain returned more severely.  She tried Tums and Maalox with no relief. She tried to wait out the pain but it did not resolve.  So, finally at about 4 am today she came to the ER.  She was noted to have inferolateral ST depression by ECG and troponin was elevated at 3.4.  Chest pain resolved with NTG in the ER but has now returned less severely (5/10).    Risk factors are HTN and hyperlipidemia.  No FH of CAD.  Nonsmoker.  She has tried several statins but had myalgias with them.   Review of systems complete and found to be negative unless listed above   PMH: 1. HTN 2. Hypothyroidism  Family History  Problem Relation Age of Onset  . Hypertension Mother   . Hypertension Father   . Hypertension Sister   . Cancer Sister   . Hypertension Brother   . Cancer Brother   . CVA Brother   . Transient ischemic attack Sister     Social History   Social History  . Marital Status: Married    Spouse Name: N/A  . Number of Children: N/A  . Years of Education: N/A   Occupational History  . Not on file.   Social History Main Topics  . Smoking status: Never Smoker   . Smokeless tobacco: Not on file  . Alcohol Use: No  . Drug Use: No  . Sexual Activity: Not on file   Other Topics  Concern  . Not on file   Social History Narrative     Prescriptions prior to admission  Medication Sig Dispense Refill Last Dose  . amLODipine (NORVASC) 5 MG tablet Take 5 mg by mouth daily.   12/19/2014 at Unknown time  . aspirin 81 MG tablet Take 81 mg by mouth daily.   12/19/2014 at Unknown time  . Coenzyme Q10 (CO Q-10) 100 MG CAPS Take by mouth.   12/19/2014 at Unknown time  . levothyroxine (SYNTHROID, LEVOTHROID) 88 MCG tablet Take 88 mcg by mouth daily before breakfast.   12/19/2014 at Unknown time  . lisinopril-hydrochlorothiazide (PRINZIDE,ZESTORETIC) 20-12.5 MG per tablet Take 1 tablet by mouth daily.   12/19/2014 at Unknown time  . HYDROcodone-acetaminophen (NORCO/VICODIN) 5-325 MG per tablet Take 2 tablets by mouth every 4 (four) hours as needed for moderate pain or severe pain. (Patient not taking: Reported on 12/20/2014) 10 tablet 0 Completed Course at Unknown time    Physical Exam: Blood pressure 149/70, pulse 79, temperature 98.5 F (36.9 C), temperature source Oral, resp. rate 16, height 5\' 1"  (1.549 m), weight 154 lb 3.2 oz (69.945 kg), SpO2 98 %.  General: NAD Neck: JVP 8 cm, no thyromegaly or thyroid nodule.  Lungs: Clear to  auscultation bilaterally with normal respiratory effort. CV: Nondisplaced PMI.  Heart regular S1/S2, no S3/S4, 2/6 HSM LLSB/apex.  No peripheral edema.  No carotid bruit.  Normal pedal pulses.  Abdomen: Soft, nontender, no hepatosplenomegaly, no distention.  Skin: Intact without lesions or rashes.  Neurologic: Alert and oriented x 3.  Psych: Normal affect. Extremities: No clubbing or cyanosis.  HEENT: Normal.   Labs:   Lab Results  Component Value Date   WBC 12.8* 12/20/2014   HGB 14.5 12/20/2014   HCT 42.9 12/20/2014   MCV 88.8 12/20/2014   PLT 369 12/20/2014    Recent Labs Lab 12/20/14 0428  NA 136  K 3.9  CL 100*  CO2 28  BUN 12  CREATININE 0.79  CALCIUM 9.9  GLUCOSE 138*    Radiology: - CXR: Clear lung fields  EKG:  NSR, inferior and anterolateral ST depression  ASSESSMENT AND PLAN: 74 yo with history of HTN and hypothyroidism was admitted with NSTEMI.  1. NSTEMI: Troponin 3.4 with inferolateral ST depression.  She has had stuttering chest pain for 2 days now.  Chest pain returned about 5 minutes ago.  - Cycle troponin to peak.  - She will need coronary angiography this morning, will arrange.  - NTG now then start NTG gtt.  - metoprolol 25 mg every 8 hrs - heparin gtt - ASA - Echo 2. Hyperlipidemia: Myalgias with Crestor, Lipitor, and simvastatin.  I will try her on pravastatin this admission.   Signed: Loralie Champagne 12/20/2014, 6:51 AM

## 2014-12-20 NOTE — Progress Notes (Signed)
ANTICOAGULATION CONSULT NOTE - Initial Consult  Pharmacy Consult for Heparin Indication: chest pain/ACS  Allergies  Allergen Reactions  . Codeine   . Crestor [Rosuvastatin] Other (See Comments)    myaglia  . Lipitor [Atorvastatin] Other (See Comments)    myaglia  . Septra [Sulfamethoxazole-Trimethoprim]   . Simvastatin Other (See Comments)    myaglia  . Welchol [Colesevelam Hcl] Other (See Comments)    myaglia    Patient Measurements: Height: 5\' 1"  (154.9 cm) Weight: 152 lb (68.947 kg) IBW/kg (Calculated) : 47.8 Heparin Dosing Weight: 62 kg  Vital Signs: Temp: 97.8 F (36.6 C) (11/17 0422) Temp Source: Oral (11/17 0422) BP: 140/65 mmHg (11/17 0500) Pulse Rate: 84 (11/17 0500)  Labs:  Recent Labs  12/20/14 0428  HGB 14.5  HCT 42.9  PLT 369    CrCl cannot be calculated (Patient has no serum creatinine result on file.).   Medical History: Past Medical History  Diagnosis Date  . Hypertension   . Thyroid disease   . Osteopenia     hip    Medications:  Awaiting home med rec  Assessment: 73 y.o. F presents with CP. To begin heparin for r/o ACS. CBC stable.  Goal of Therapy:  Heparin level 0.3-0.7 units/ml Monitor platelets by anticoagulation protocol: Yes   Plan:  Heparin IV bolus 3500 units Heparin gtt at 750 units/hr Will f/u heparin level in 8 hours Daily heparin level and CBC  Sherlon Handing, PharmD, BCPS Clinical pharmacist, pager [phone] 12/20/2014,5:01 AM

## 2014-12-20 NOTE — ED Notes (Signed)
Pt. reports worsening central chest pain with mild SOB onset Tuesday , denies nausea or diaphoresis , pain radiates to chin , upper back and bilateral arms .

## 2014-12-20 NOTE — ED Notes (Signed)
Patient transported to X-ray 

## 2014-12-20 NOTE — Progress Notes (Signed)
ANTICOAGULATION CONSULT NOTE - Follow Up Consult  Pharmacy Consult for heparin Indication: chest pain/ACS  Allergies  Allergen Reactions  . Codeine   . Crestor [Rosuvastatin] Other (See Comments)    myaglia  . Lipitor [Atorvastatin] Other (See Comments)    myaglia  . Septra [Sulfamethoxazole-Trimethoprim]   . Simvastatin Other (See Comments)    myaglia  . Welchol [Colesevelam Hcl] Other (See Comments)    myaglia    Patient Measurements: Height: 5\' 1"  (154.9 cm) Weight: 154 lb 3.2 oz (69.945 kg) IBW/kg (Calculated) : 47.8 Heparin Dosing Weight: 62kg  Vital Signs: Temp: 98.5 F (36.9 C) (11/17 0554) Temp Source: Oral (11/17 0554) BP: 134/79 mmHg (11/17 1345) Pulse Rate: 95 (11/17 1345)  Labs:  Recent Labs  12/20/14 0428 12/20/14 0855  HGB 14.5  --   HCT 42.9  --   PLT 369  --   LABPROT  --  14.4  INR  --  1.10  CREATININE 0.79  --   TROPONINI  --  5.31*    Estimated Creatinine Clearance: 56.8 mL/min (by C-G formula based on Cr of 0.79).   Medications:  Scheduled:  . amLODipine  5 mg Oral Daily  . [START ON 12/21/2014] aspirin EC  81 mg Oral Daily  . [START ON 12/21/2014] aspirin EC  81 mg Oral Daily  . levothyroxine  88 mcg Oral QAC breakfast  . lisinopril  5 mg Oral Daily  . metoprolol tartrate  25 mg Oral 3 times per day  . pravastatin  80 mg Oral q1800  . sodium chloride  3 mL Intravenous Q12H  . sodium chloride  3 mL Intravenous Q12H  . ticagrelor  90 mg Oral BID  . white petrolatum       Infusions:  . sodium chloride    . bivalirudin (ANGIOMAX) infusion 5 mg/mL (Cath Lab,ACS,PCI indication)    . heparin 750 Units/hr (12/20/14 0522)  . nitroGLYCERIN      Assessment: 74 yo female s/p cath with DES to LAD to restart heparin post cath (8 hrs post sheath removal). Femoral sheath in place and patient on angiomax for 4 hours post cath. Plans for sheath removal at about 6:30pm.  -Last heparin rate was 750 units (anti-Xa levels prior to cath)  Goal  of Therapy:  Heparin level 0.3-0.7 units/ml Monitor platelets by anticoagulation protocol: Yes   Plan:  -Restart heparin at 750 units/hr at 2:30 am on 11/18 -Heparin level in 6 hrs and daily with CBC daily  Hildred Laser, Pharm D 12/20/2014 2:26 PM

## 2014-12-20 NOTE — Progress Notes (Signed)
CRITICAL VALUE ALERT  Critical value received: Troponin 5.31  Date of notification:  12/20/2014  Time of notification:  09:55 am  Critical value read back:Yes.    Nurse who received alert:  Ruben Reason    MD notified (1st page):  Rosaria Ferries, NP   Time of first page:  10:02 am    MD notified (2nd page):  Time of second page:  Responding MD:  Rosaria Ferries    Time MD responded:  10:05 am

## 2014-12-20 NOTE — Interval H&P Note (Signed)
Cath Lab Visit (complete for each Cath Lab visit)  Clinical Evaluation Leading to the Procedure:   ACS: Yes.    Non-ACS:    Anginal Classification: CCS IV  Anti-ischemic medical therapy: Maximal Therapy (2 or more classes of medications)  Non-Invasive Test Results: No non-invasive testing performed  Prior CABG: No previous CABG      History and Physical Interval Note:  12/20/2014 10:54 AM  [name]  has presented today for surgery, with the diagnosis of cp  The various methods of treatment have been discussed with the patient and family. After consideration of risks, benefits and other options for treatment, the patient has consented to  Procedure(s): Left Heart Cath and Coronary Angiography (N/Reilly) as Reilly surgical intervention .  The patient's history has been reviewed, patient examined, no change in status, stable for surgery.  I have reviewed the patient's chart and labs.  Questions were answered to the patient's satisfaction.     Cathy Reilly

## 2014-12-20 NOTE — Progress Notes (Signed)
Utilization review completed. Bernadett Milian, RN, BSN. 

## 2014-12-20 NOTE — Progress Notes (Signed)
  Echocardiogram 2D Echocardiogram has been performed.  Cathy Reilly 12/20/2014, 2:26 PM

## 2014-12-21 ENCOUNTER — Encounter (HOSPITAL_COMMUNITY): Payer: Self-pay

## 2014-12-21 LAB — BASIC METABOLIC PANEL
ANION GAP: 7 (ref 5–15)
BUN: 10 mg/dL (ref 6–20)
CO2: 25 mmol/L (ref 22–32)
Calcium: 8.4 mg/dL — ABNORMAL LOW (ref 8.9–10.3)
Chloride: 106 mmol/L (ref 101–111)
Creatinine, Ser: 0.72 mg/dL (ref 0.44–1.00)
GFR calc Af Amer: >60 mL/min (ref >60)
GFR calc non Af Amer: >60 mL/min (ref >60)
GLUCOSE: 123 mg/dL — AB (ref 65–99)
POTASSIUM: 3.5 mmol/L (ref 3.5–5.1)
Sodium: 138 mmol/L (ref 135–145)

## 2014-12-21 LAB — CBC
HEMATOCRIT: 33.6 % — AB (ref 36.0–46.0)
Hemoglobin: 11.5 g/dL — ABNORMAL LOW (ref 12.0–15.0)
MCH: 30.3 pg (ref 26.0–34.0)
MCHC: 34.2 g/dL (ref 30.0–36.0)
MCV: 88.7 fL (ref 78.0–100.0)
Platelets: 239 10*3/uL (ref 150–400)
RBC: 3.79 MIL/uL — AB (ref 3.87–5.11)
RDW: 13 % (ref 11.5–15.5)
WBC: 11.6 10*3/uL — AB (ref 4.0–10.5)

## 2014-12-21 LAB — LIPID PANEL
CHOL/HDL RATIO: 5.1 ratio
Cholesterol: 175 mg/dL (ref 0–200)
HDL: 34 mg/dL — AB (ref >40)
LDL Cholesterol: 117 mg/dL — ABNORMAL HIGH (ref 0–99)
Triglycerides: 120 mg/dL (ref <150)
VLDL: 24 mg/dL (ref 0–40)

## 2014-12-21 LAB — HEMOGLOBIN A1C
Hgb A1c MFr Bld: 6 % — ABNORMAL HIGH (ref 4.8–5.6)
Mean Plasma Glucose: 126 mg/dL

## 2014-12-21 MED ORDER — ALPRAZOLAM 0.25 MG PO TABS
0.2500 mg | ORAL_TABLET | Freq: Three times a day (TID) | ORAL | Status: DC | PRN
  Administered 2014-12-22: 0.25 mg via ORAL
  Filled 2014-12-21: qty 1

## 2014-12-21 MED FILL — Heparin Sodium (Porcine) Inj 1000 Unit/ML: INTRAMUSCULAR | Qty: 10 | Status: AC

## 2014-12-21 NOTE — Progress Notes (Signed)
Report called to receiving RN 3w18. Patient with no complaints at the current time. Will transfer via [place].

## 2014-12-21 NOTE — Progress Notes (Signed)
CARDIAC REHAB PHASE I   PRE:  Rate/Rhythm: 84 SR  BP:  Supine:   Sitting: 153/72  Standing:    SaO2: 98%RA  MODE:  Ambulation: 500 ft   POST:  Rate/Rhythm: 99 SR  BP:  Supine:   Sitting: 150/70  Standing:    SaO2: 99%RA 1050-1140 Pt walked 500 ft with hand held asst without CP. Tolerated well. To recliner after walk. Education started with pt and family and understanding voiced. Reviewed NTG use, MI restrictions, heart healthy diet including watching carbs with A1C of 6.0. Pt has brilinta card and I stressed importance of brilinta with stent. Discussed CRP 2 and will refer to [place].  Will complete ed tomorrow.    Graylon Good, RN BSN  12/21/2014 11:36 AM

## 2014-12-21 NOTE — Progress Notes (Signed)
Subjective:  Denies chest pain or dyspnea.  Objective:  Vital Signs in the last 24 hours: Temp:  [98 F (36.7 C)-99.2 F (37.3 C)] 99.2 F (37.3 C) (11/18 0700) Pulse Rate:  [63-171] 68 (11/18 0700) Resp:  [8-30] 16 (11/18 0700) BP: (91-150)/(49-99) 128/65 mmHg (11/18 0700) SpO2:  [0 %-100 %] 92 % (11/18 0700) Arterial Line BP: (132-135)/(63-64) 135/63 mmHg (11/17 2200) Weight:  [158 lb 8.2 oz (71.9 kg)] 158 lb 8.2 oz (71.9 kg) (11/18 0234)  Intake/Output from previous day: 11/17 0701 - 11/18 0700 In: 2317.6 [P.O.:220; I.V.:2097.6] Out: 1000 [Urine:1000]  Physical Exam: Pt is alert and oriented, NAD HEENT: normal Neck: supple Lungs: CTA bilaterally CV: RRR without murmur or gallop Abd: soft, NT, Positive BS, no hepatomegaly Ext: no C/C/E, distal pulses intact and equal Skin: warm/dry no rash   Lab Results:  Recent Labs  12/20/14 0428 12/21/14 0437  WBC 12.8* 11.6*  HGB 14.5 11.5*  PLT 369 239    Recent Labs  12/20/14 0428 12/21/14 0437  NA 136 138  K 3.9 3.5  CL 100* 106  CO2 28 25  GLUCOSE 138* 123*  BUN 12 10  CREATININE 0.79 0.72    Recent Labs  12/20/14 1546 12/20/14 1828  TROPONINI 56.64* 55.11*    Cardiac Studies: Cath 11/17:   Ramus lesion, 90% stenosed.  Prox LAD lesion, 70% stenosed.  Prox LAD to Mid LAD lesion, 99% stenosed. Post intervention, there is a 0% residual stenosis.  There is mild left ventricular systolic dysfunction.  Mild acute left ventricular dysfunction with an ejection fraction of 45-50% with mild to moderate mid anterolateral hypocontractility.  Two-vessel coronary obstructive disease with 70% proximal followed by subtotal 99% LAD stenosis with TIMI 1 flow into the LAD; a ramus intermediate vessel with distal ulcerated lesion/spontaneous dissection in a distal inferior branch; normal small circumflex; normal large dominant RCA.  Development of ventricular tachycardia/ventricular fibrillation requiring  defibrillation with resultant total occlusion of the proximal LAD at the site of previous 99% stenosis and TIMI 0 flow.  Successful acute percutaneous coronary intervention of the LAD with PTCA, and ultimate insertion of a 3.033 mm Xience Alpine DES stent extending from the ostium to the proximal mid segment of the LAD to cover all lesions with the stenoses being reduced to 0% and resumption of brisk TIMI-3 flow.  Echo 11/17: LV EF 45-50%, mild LVH, moderate hypokinesis of mid-apicalanteroseptal myocardium, mild mild regurgitation, PA peak pressure 41 mmHg.  Tele: NSR  Assessment/Plan:  74 year old woman with history of hypertension and hypothyrodism presented with chest pain found to have NSTEMI.  NSTEMI, CAD: Troponin 3.4 on admission increased to 56.64, now downtrending. EKG on admission with ST depression in inferolateral leads. She proceeded to cath. Mild systolic dysfunction with mild to moderate mid anterolateral hypocontractility. She had ventricular tach/fib requiring defibrillation. Underwent DES to proximal to mid LAD. 90% stenosis of ramus. Maintained on Angiomax for 4 hours past procedure. Repeat EKG this morning with Q waves in inferior leads. -Discontinue hep gtt -Possible return to cath Monday -ASA 81mg  daily, Brillinta 90mg  BID -lisinopril 5mg  daily -metoprolol 25mg  TID -pravastatin 80mg  daily (previous intolerance of lipitor and crestor - myalgia) -d/c nitro gtt  Hypertension: continue home amlodipine 5mg  daily.  Hypothyroidism: continue home Synthroid 16mcg daily  Prediabetes: Hgb A1c 6%  Jacques Earthly, M.D. 12/21/2014, 7:49 AM   I have personally seen and examined this patient with Jacques Earthly, MD. I agree with the assessment and plan as outlined  above. She was admitted with a NSTEMI. Dr. Claiborne Billings performed her cath 12/20/14. She was found to have high grade LAD stenosis that shut down before PCI and led to V. Fib arrest. She was resuscitated and a DES was placed  in the LAD. Dr. Claiborne Billings also noted strange appearance of flow down the moderate caliber intermediate branch. He has requested repeat cath Monday to assess the intermediate branch. She is stable today on ASA, Brilinta, metoprolol, statin, Ace-inh. Will stop heparin drip today. Small hematoma right groin. Stop NTG drip. Transfer to tele unit. Plan relook cath Monday per Dr. Claiborne Billings.   MCALHANY,CHRISTOPHER 12/21/2014 10:12 AM

## 2014-12-21 NOTE — Progress Notes (Signed)
TR band removed at 0000. Site level 0 (old blood noted around site) after removal. No bruising. Pulse +2. Pressure dressing applied. Pt instructed to limit use of arm and to call out of bleeding noted. Will continue to monitor.

## 2014-12-21 NOTE — Progress Notes (Signed)
Sheath pulled at 2215 by RN Trilby Drummer. RN Jinny Blossom also present in room. 25 mcg fentanyl given prior to removal. Atropine at bedside for emergency. Pressure applied for 20 minutes. No ectopy, BP stable. Site level 1 with no post bleeding but small hematoma at site. Pressure dressing applied. Pt instructed to cover and apply pressure if needing to cough/sneeze/etc and to call out if bleeding is felt/noticed. RN will continue to monitor.

## 2014-12-22 LAB — BASIC METABOLIC PANEL
Anion gap: 8 (ref 5–15)
BUN: 10 mg/dL (ref 6–20)
CO2: 24 mmol/L (ref 22–32)
CREATININE: 0.77 mg/dL (ref 0.44–1.00)
Calcium: 8.6 mg/dL — ABNORMAL LOW (ref 8.9–10.3)
Chloride: 106 mmol/L (ref 101–111)
GFR calc Af Amer: >60 mL/min (ref >60)
GLUCOSE: 106 mg/dL — AB (ref 65–99)
Potassium: 3.4 mmol/L — ABNORMAL LOW (ref 3.5–5.1)
SODIUM: 138 mmol/L (ref 135–145)

## 2014-12-22 LAB — CBC
HCT: 34 % — ABNORMAL LOW (ref 36.0–46.0)
Hemoglobin: 11.2 g/dL — ABNORMAL LOW (ref 12.0–15.0)
MCH: 29.6 pg (ref 26.0–34.0)
MCHC: 32.9 g/dL (ref 30.0–36.0)
MCV: 89.7 fL (ref 78.0–100.0)
PLATELETS: 223 10*3/uL (ref 150–400)
RBC: 3.79 MIL/uL — ABNORMAL LOW (ref 3.87–5.11)
RDW: 13 % (ref 11.5–15.5)
WBC: 10.8 10*3/uL — AB (ref 4.0–10.5)

## 2014-12-22 MED ORDER — POTASSIUM CHLORIDE 20 MEQ/15ML (10%) PO SOLN
40.0000 meq | Freq: Once | ORAL | Status: AC
  Administered 2014-12-22: 40 meq via ORAL
  Filled 2014-12-22: qty 30

## 2014-12-22 NOTE — Progress Notes (Signed)
Subjective:  Chest pain or shortness of breath  Objective:  Vital Signs in the last 24 hours: BP 117/53 mmHg  Pulse 79  Temp(Src) 98.3 F (36.8 C) (Oral)  Resp 18  Ht 5\' 1"  (1.549 m)  Wt 69.7 kg (153 lb 10.6 oz)  BMI 29.05 kg/m2  SpO2 98%  Physical Exam: Pleasant female in no acute distress Lungs:  Clear Cardiac:  Regular rhythm, normal S1 and S2, no S3 Extremities:  No edema present  Intake/Output from previous day: 11/18 0701 - 11/19 0700 In: 512.5 [P.O.:480; I.V.:32.5] Out: 525 [Urine:525]  Weight Filed Weights   12/20/14 0554 12/21/14 0234 12/22/14 0325  Weight: 69.945 kg (154 lb 3.2 oz) 71.9 kg (158 lb 8.2 oz) 69.7 kg (153 lb 10.6 oz)    Lab Results: Basic Metabolic Panel:  Recent Labs  12/21/14 0437 12/22/14 0326  NA 138 138  K 3.5 3.4*  CL 106 106  CO2 25 24  GLUCOSE 123* 106*  BUN 10 10  CREATININE 0.72 0.77   CBC:  Recent Labs  12/20/14 0428 12/21/14 0437 12/22/14 0326  WBC 12.8* 11.6* 10.8*  NEUTROABS 9.2*  --   --   HGB 14.5 11.5* 11.2*  HCT 42.9 33.6* 34.0*  MCV 88.8 88.7 89.7  PLT 369 239 223   Cardiac Enzymes: Troponin (Point of Care Test)  Recent Labs  12/20/14 0423  TROPIPOC 3.40*   Cardiac Panel (last 3 results)  Recent Labs  12/20/14 0855 12/20/14 1546 12/20/14 1828  TROPONINI 5.31* 56.64* 55.11*    Telemetry: Sinus rhythm.  One episode of nonsustained V. tach noted  Assessment/Plan:  1.  Evolving anterior infarction by EKG and enzymes 2.  Two-vessel coronary artery disease 3.  Ventricular tachycardia nonsustained noted 4.  Hypokalemia needs repletion  Recommendations:  By EKG and enzymes appears to have had an evolving anterior infarction.  Mild ST elevation noted on EKG on admission.  Recath planned Monday to look at ramus branch.     Kerry Hough  MD [facility]  12/22/2014, 10:26 AM

## 2014-12-22 NOTE — Care Management Important Message (Signed)
Important Message  Patient Details  Name: MEIKO DEFELICE MRN: [id number] Date of Birth: [birth year]   Medicare Important Message Given:  Yes    Norina Buzzard, RN 12/22/2014, 10:39 AM

## 2014-12-22 NOTE — Progress Notes (Signed)
CARDIAC REHAB PHASE I   PRE:  Rate/Rhythm: 74 SR  BP:  Supine:   Sitting:   Standing: 152/61   SaO2: 100% RA  MODE:  Ambulation: 350 ft   POST:  Rate/Rhythm: 78  BP:  Supine: 143/60 Sitting:   Standing:    SaO2: 98% RA  [id number] Patient ambulated 350 ft with assist x1, c/o of mild SOB during walking 2/4 on the dyspnea scale, to bed after walk. SOB resolved with rest, pt denies CP. Pt states this is the most SOB she's had with activity, notified patient's nurse of SOB. Reviewed exercise guidelines with patient, including endpoints, CP, NTG use and calling 911. Pt's verbalizes understanding of instructions given.  Sol Passer, MS, ACSM CCEP

## 2014-12-23 LAB — BASIC METABOLIC PANEL
Anion gap: 6 (ref 5–15)
BUN: 11 mg/dL (ref 6–20)
CHLORIDE: 107 mmol/L (ref 101–111)
CO2: 24 mmol/L (ref 22–32)
Calcium: 8.8 mg/dL — ABNORMAL LOW (ref 8.9–10.3)
Creatinine, Ser: 0.78 mg/dL (ref 0.44–1.00)
GFR calc Af Amer: >60 mL/min (ref >60)
GFR calc non Af Amer: >60 mL/min (ref >60)
GLUCOSE: 100 mg/dL — AB (ref 65–99)
POTASSIUM: 3.6 mmol/L (ref 3.5–5.1)
Sodium: 137 mmol/L (ref 135–145)

## 2014-12-23 MED ORDER — SODIUM CHLORIDE 0.9 % WEIGHT BASED INFUSION: 1.0000 mL/kg/h | INTRAVENOUS | Status: DC

## 2014-12-23 MED ORDER — SODIUM CHLORIDE 0.9 % WEIGHT BASED INFUSION
3.0000 mL/kg/h | INTRAVENOUS | Status: DC
Start: 2014-12-24 — End: 2014-12-24
  Administered 2014-12-24: 3 mL/kg/h via INTRAVENOUS

## 2014-12-23 MED ORDER — SODIUM CHLORIDE 0.9 % IJ SOLN: 3.0000 mL | Freq: Two times a day (BID) | INTRAMUSCULAR | Status: DC

## 2014-12-23 MED ORDER — SODIUM CHLORIDE 0.9 % IV SOLN: 250.0000 mL | INTRAVENOUS | Status: DC | PRN

## 2014-12-23 MED ORDER — ASPIRIN 81 MG PO CHEW
81.0000 mg | CHEWABLE_TABLET | ORAL | Status: AC
  Administered 2014-12-24: 81 mg via ORAL
  Filled 2014-12-23: qty 1

## 2014-12-23 MED ORDER — POTASSIUM CHLORIDE 20 MEQ/15ML (10%) PO SOLN
40.0000 meq | Freq: Once | ORAL | Status: AC
  Administered 2014-12-23: 40 meq via ORAL
  Filled 2014-12-23: qty 30

## 2014-12-23 MED ORDER — SODIUM CHLORIDE 0.9 % IJ SOLN: 3.0000 mL | INTRAMUSCULAR | Status: DC | PRN

## 2014-12-23 NOTE — Progress Notes (Signed)
Subjective:  She had a mild amount of dyspnea when she was ambulating yesterday but none today.  No recurrent chest pain.  Objective:  Vital Signs in the last 24 hours: BP 135/57 mmHg  Pulse 72  Temp(Src) 98.7 F (37.1 C) (Oral)  Resp 18  Ht 5\' 1"  (1.549 m)  Wt 69.4 kg (153 lb)  BMI 28.92 kg/m2  SpO2 98%  Physical Exam: Pleasant female in no acute distress Lungs:  Clear Cardiac:  Regular rhythm, normal S1 and S2, no S3 Extremities:  No edema present  Intake/Output from previous day: 11/19 0701 - 11/20 0700 In: 1320 [P.O.:1320] Out: 700 [Urine:700]  Weight Filed Weights   12/21/14 0234 12/22/14 0325 12/23/14 0530  Weight: 71.9 kg (158 lb 8.2 oz) 69.7 kg (153 lb 10.6 oz) 69.4 kg (153 lb)    Lab Results: Basic Metabolic Panel:  Recent Labs  12/22/14 0326 12/23/14 0152  NA 138 137  K 3.4* 3.6  CL 106 107  CO2 24 24  GLUCOSE 106* 100*  BUN 10 11  CREATININE 0.77 0.78   CBC:  Recent Labs  12/21/14 0437 12/22/14 0326  WBC 11.6* 10.8*  HGB 11.5* 11.2*  HCT 33.6* 34.0*  MCV 88.7 89.7  PLT 239 223    Cardiac Panel (last 3 results)  Recent Labs  12/20/14 1546 12/20/14 1828  TROPONINI 56.64* 55.11*    Telemetry: Sinus rhythm.    Assessment/Plan:  1.  Evolving anterior infarction by EKG and enzymes 2.  Two-vessel coronary artery disease 3.  Ventricular tachycardia nonsustained noted 4.  Hypokalemia needs repletion  Recommendations:  EKG and enzymes suggest an anterior infarction.  Potassium given yesterday but needs additional today.  No further V. tach noted today.  Cardiac catheterization planned again for tomorrow to relook at intermediate branch.      Kerry Hough  MD [facility]  12/23/2014, 10:02 AM

## 2014-12-23 NOTE — Progress Notes (Addendum)
Pt would like to speak to MD who will be doing her cath tomorrow before signing consent.  Has questions about what MD will be doing during procedure.  States MD spoke to her family only and she would like to know what exactly they are going to do.  Pt DID SIGN consent for cath on 12/23/2014.

## 2014-12-24 ENCOUNTER — Encounter (HOSPITAL_COMMUNITY): Payer: Self-pay | Admitting: Interventional Cardiology

## 2014-12-24 ENCOUNTER — Encounter (HOSPITAL_COMMUNITY)
Admission: EM | Disposition: A | Discharge status: Home / Self Care | Payer: Self-pay | Source: Home / Self Care | Attending: Cardiology

## 2014-12-24 DIAGNOSIS — E785 Hyperlipidemia, unspecified: Secondary | ICD-10-CM

## 2014-12-24 DIAGNOSIS — Z955 Presence of coronary angioplasty implant and graft: Secondary | ICD-10-CM | POA: Insufficient documentation

## 2014-12-24 HISTORY — PX: CARDIAC CATHETERIZATION: SHX172

## 2014-12-24 LAB — BASIC METABOLIC PANEL
Anion gap: 7 (ref 5–15)
BUN: 10 mg/dL (ref 6–20)
CALCIUM: 8.8 mg/dL — AB (ref 8.9–10.3)
CHLORIDE: 106 mmol/L (ref 101–111)
CO2: 24 mmol/L (ref 22–32)
CREATININE: 0.77 mg/dL (ref 0.44–1.00)
GFR calc non Af Amer: >60 mL/min (ref >60)
Glucose, Bld: 104 mg/dL — ABNORMAL HIGH (ref 65–99)
Potassium: 3.8 mmol/L (ref 3.5–5.1)
SODIUM: 137 mmol/L (ref 135–145)

## 2014-12-24 SURGERY — LEFT HEART CATH AND CORONARY ANGIOGRAPHY
Anesthesia: LOCAL

## 2014-12-24 MED ORDER — LIDOCAINE HCL (PF) 1 % IJ SOLN
INTRAMUSCULAR | Status: AC
  Filled 2014-12-24: qty 30

## 2014-12-24 MED ORDER — TICAGRELOR 90 MG PO TABS: 90.0000 mg | ORAL_TABLET | Freq: Two times a day (BID) | ORAL | Status: DC

## 2014-12-24 MED ORDER — SODIUM CHLORIDE 0.9 % IV SOLN: 250.0000 mL | INTRAVENOUS | Status: DC | PRN

## 2014-12-24 MED ORDER — SODIUM CHLORIDE 0.9 % WEIGHT BASED INFUSION: 1.0000 mL/kg/h | INTRAVENOUS | Status: AC

## 2014-12-24 MED ORDER — ONDANSETRON HCL 4 MG/2ML IJ SOLN: 4.0000 mg | Freq: Four times a day (QID) | INTRAMUSCULAR | Status: DC | PRN

## 2014-12-24 MED ORDER — HEPARIN (PORCINE) IN NACL 2-0.9 UNIT/ML-% IJ SOLN
INTRAMUSCULAR | Status: AC
  Filled 2014-12-24: qty 1500

## 2014-12-24 MED ORDER — METOPROLOL TARTRATE 25 MG PO TABS: 25.0000 mg | ORAL_TABLET | Freq: Three times a day (TID) | ORAL | Status: DC

## 2014-12-24 MED ORDER — LISINOPRIL 5 MG PO TABS: 5.0000 mg | ORAL_TABLET | Freq: Every day | ORAL | Status: DC

## 2014-12-24 MED ORDER — MIDAZOLAM HCL 2 MG/2ML IJ SOLN
INTRAMUSCULAR | Status: AC
  Filled 2014-12-24: qty 2

## 2014-12-24 MED ORDER — ACETAMINOPHEN 325 MG PO TABS: 650.0000 mg | ORAL_TABLET | ORAL | Status: DC | PRN

## 2014-12-24 MED ORDER — MIDAZOLAM HCL 2 MG/2ML IJ SOLN
INTRAMUSCULAR | Status: DC | PRN
  Administered 2014-12-24: 1 mg via INTRAVENOUS
  Administered 2014-12-24: 2 mg via INTRAVENOUS

## 2014-12-24 MED ORDER — NITROGLYCERIN 0.4 MG SL SUBL: 0.4000 mg | SUBLINGUAL_TABLET | SUBLINGUAL | Status: DC | PRN

## 2014-12-24 MED ORDER — IOHEXOL 350 MG/ML SOLN
INTRAVENOUS | Status: DC | PRN
  Administered 2014-12-24: 100 mL via INTRAVENOUS

## 2014-12-24 MED ORDER — LIDOCAINE HCL (PF) 1 % IJ SOLN
INTRAMUSCULAR | Status: DC | PRN
  Administered 2014-12-24: 12 mL

## 2014-12-24 MED ORDER — FENTANYL CITRATE (PF) 100 MCG/2ML IJ SOLN
INTRAMUSCULAR | Status: AC
  Filled 2014-12-24: qty 2

## 2014-12-24 MED ORDER — FENTANYL CITRATE (PF) 100 MCG/2ML IJ SOLN
INTRAMUSCULAR | Status: DC | PRN
  Administered 2014-12-24 (×2): 25 ug via INTRAVENOUS

## 2014-12-24 MED ORDER — SODIUM CHLORIDE 0.9 % IJ SOLN: 3.0000 mL | INTRAMUSCULAR | Status: DC | PRN

## 2014-12-24 MED ORDER — SODIUM CHLORIDE 0.9 % IJ SOLN: 3.0000 mL | Freq: Two times a day (BID) | INTRAMUSCULAR | Status: DC

## 2014-12-24 MED ORDER — PRAVASTATIN SODIUM 80 MG PO TABS: 80.0000 mg | ORAL_TABLET | Freq: Every day | ORAL | Status: DC

## 2014-12-24 MED ORDER — LIDOCAINE HCL (PF) 1 % IJ SOLN
INTRAMUSCULAR | Status: DC | PRN
  Administered 2014-12-24: 13:00:00

## 2014-12-24 SURGICAL SUPPLY — 8 items
CATH INFINITI 5FR JL4 (CATHETERS) ×2 IMPLANT
CATH INFINITI JR4 5F (CATHETERS) ×2 IMPLANT
ELECT DEFIB PAD ADLT CADENCE (PAD) ×2 IMPLANT
KIT HEART LEFT (KITS) ×2 IMPLANT
PACK CARDIAC CATHETERIZATION (CUSTOM PROCEDURE TRAY) ×2 IMPLANT
SHEATH PINNACLE 5F 10CM (SHEATH) ×2 IMPLANT
TRANSDUCER W/STOPCOCK (MISCELLANEOUS) ×2 IMPLANT
WIRE EMERALD 3MM-J .035X150CM (WIRE) ×2 IMPLANT

## 2014-12-24 NOTE — Progress Notes (Signed)
CARDIAC REHAB PHASE I   PRE:  Rate/Rhythm: 68 SR    BP: sitting 130/60    SaO2:   MODE:  Ambulation: 550 ft   POST:  Rate/Rhythm: 81SR    BP: sitting 140/66     SaO2:   Tolerated very well. No dyspnea or CP. Reviewed ed, good comprehension. Eager to do CRPII. [phone]   Josephina Shih Shelly CES, ACSM 12/24/2014 8:51 AM

## 2014-12-24 NOTE — Interval H&P Note (Signed)
Cath Lab Visit (complete for each Cath Lab visit)  Clinical Evaluation Leading to the Procedure:   ACS: Yes.    Non-ACS:    Anginal Classification: CCS IV  Anti-ischemic medical therapy: Maximal Therapy (2 or more classes of medications)  Non-Invasive Test Results: No non-invasive testing performed  Prior CABG: No previous CABG      History and Physical Interval Note:  12/24/2014 1:04 PM  Cathy Reilly  has presented today for surgery, with the diagnosis of NSTEMI  The various methods of treatment have been discussed with the patient and family. After consideration of risks, benefits and other options for treatment, the patient has consented to  Procedure(s): Left Heart Cath and Coronary Angiography (N/A) as a surgical intervention .  The patient's history has been reviewed, patient examined, no change in status, stable for surgery.  I have reviewed the patient's chart and labs.  Questions were answered to the patient's satisfaction.     Jehad Bisono S.

## 2014-12-24 NOTE — Progress Notes (Signed)
Patient Profile: 74 year old woman with history of hypertension and hypothyrodism presented with chest pain found to have NSTEMI. Dr. Claiborne Billings performed her cath 12/20/14. She was found to have high grade LAD stenosis that shut down before PCI and led to V. Fib arrest. She was resuscitated and a DES was placed in the LAD. Dr. Claiborne Billings also noted strange appearance of flow down the moderate caliber intermediate branch.  Plan is for re-look cath today to reassess the intermediate branch.  Subjective: Doing well. Denies any recurrent CP or dyspnea. Family by bedside.   Objective: Vital signs in last 24 hours: Temp:  [98.3 F (36.8 C)-99.7 F (37.6 C)] 98.3 F (36.8 C) (11/21 0529) Pulse Rate:  [71-85] 71 (11/21 1014) Resp:  [18-20] 18 (11/21 0529) BP: (132-148)/(51-64) 132/63 mmHg (11/21 1014) SpO2:  [98 %-100 %] 98 % (11/21 0529) Weight:  [152 lb 3.2 oz (69.037 kg)] 152 lb 3.2 oz (69.037 kg) (11/21 0529) Last BM Date: 12/23/14  Intake/Output from previous day: 11/20 0701 - 11/21 0700 In: 1083 [P.O.:1080; I.V.:3] Out: 2250 [Urine:2250] Intake/Output this shift: Total I/O In: 208.2 [I.V.:208.2] Out: -   Medications Current Facility-Administered Medications  Medication Dose Route Frequency Provider Last Rate Last Dose  . 0.9 %  sodium chloride infusion  250 mL Intravenous PRN Larey Dresser, MD      . 0.9 %  sodium chloride infusion  250 mL Intravenous PRN Troy Sine, MD      . 0.9 %  sodium chloride infusion   Intravenous Continuous Troy Sine, MD   Stopped at 12/21/14 [phone]  . 0.9 %  sodium chloride infusion  250 mL Intravenous PRN Jacolyn Reedy, MD      . 0.9% sodium chloride infusion  1 mL/kg/hr Intravenous Continuous Jacolyn Reedy, MD 69.4 mL/hr at 12/24/14 0710 1 mL/kg/hr at 12/24/14 0710  . acetaminophen (TYLENOL) tablet 650 mg  650 mg Oral Q4H PRN Larey Dresser, MD      . acetaminophen (TYLENOL) tablet 650 mg  650 mg Oral Q4H PRN Troy Sine, MD   650 mg at  12/20/14 1357  . ALPRAZolam Duanne Moron) tablet 0.25 mg  0.25 mg Oral TID PRN Eileen Stanford, PA-C   0.25 mg at 12/22/14 2224  . amLODipine (NORVASC) tablet 5 mg  5 mg Oral Daily Larey Dresser, MD   5 mg at 12/24/14 1015  . aspirin EC tablet 81 mg  81 mg Oral Daily Troy Sine, MD   81 mg at 12/24/14 1015  . fentaNYL (SUBLIMAZE) injection 25 mcg  25 mcg Intravenous Q2H PRN Jules Husbands, MD   25 mcg at 12/20/14 2158  . levothyroxine (SYNTHROID, LEVOTHROID) tablet 88 mcg  88 mcg Oral QAC breakfast Larey Dresser, MD   88 mcg at 12/24/14 [phone]  . lisinopril (PRINIVIL,ZESTRIL) tablet 5 mg  5 mg Oral Daily Larey Dresser, MD   5 mg at 12/24/14 1015  . metoprolol tartrate (LOPRESSOR) tablet 25 mg  25 mg Oral 3 times per day Larey Dresser, MD   25 mg at 12/24/14 [phone]  . nitroGLYCERIN (NITROSTAT) SL tablet 0.4 mg  0.4 mg Sublingual Q5 Min x 3 PRN Larey Dresser, MD   0.4 mg at 12/20/14 0654  . ondansetron (ZOFRAN) injection 4 mg  4 mg Intravenous Q6H PRN Larey Dresser, MD      . ondansetron [facility]) injection 4 mg  4 mg Intravenous Q6H PRN Troy Sine,  MD      . pravastatin (PRAVACHOL) tablet 80 mg  80 mg Oral q1800 Troy Sine, MD   80 mg at 12/23/14 1836  . sodium chloride 0.9 % injection 3 mL  3 mL Intravenous Q12H Larey Dresser, MD   3 mL at 12/23/14 0918  . sodium chloride 0.9 % injection 3 mL  3 mL Intravenous PRN Larey Dresser, MD      . sodium chloride 0.9 % injection 3 mL  3 mL Intravenous Q12H Troy Sine, MD   3 mL at 12/24/14 1000  . sodium chloride 0.9 % injection 3 mL  3 mL Intravenous PRN Troy Sine, MD      . sodium chloride 0.9 % injection 3 mL  3 mL Intravenous Q12H Jacolyn Reedy, MD   0 mL at 12/23/14 2200  . sodium chloride 0.9 % injection 3 mL  3 mL Intravenous PRN Jacolyn Reedy, MD      . ticagrelor [facility]) tablet 90 mg  90 mg Oral BID Troy Sine, MD   90 mg at 12/24/14 1016    PE: General appearance: alert, cooperative and no distress Neck:  no carotid bruit and no JVD Lungs: clear to auscultation bilaterally Heart: regular rate and rhythm, S1, S2 normal, no murmur, click, rub or gallop Extremities: no LEE Pulses: 2+ and symmetric Skin: warm and dry Neurologic: Grossly normal  Lab Results:   Recent Labs  12/22/14 0326  WBC 10.8*  HGB 11.2*  HCT 34.0*  PLT 223   BMET  Recent Labs  12/22/14 0326 12/23/14 0152 12/24/14 0323  NA 138 137 137  K 3.4* 3.6 3.8  CL 106 107 106  CO2 24 24 24   GLUCOSE 106* 100* 104*  BUN 10 11 10   CREATININE 0.77 0.78 0.77  CALCIUM 8.6* 8.8* 8.8*   Cardiac Panel (last 3 results) No results for input(s): CKTOTAL, CKMB, TROPONINI, RELINDX in the last 72 hours.  Studies/Results: Re-look LHC - results pending   Assessment/Plan    Active Problems:   NSTEMI (non-ST elevated myocardial infarction) (HCC)   VT (ventricular tachycardia) (HCC)   VF (ventricular fibrillation) ([place])    1. NSTEMI: Dr. Claiborne Billings performed initial cath 12/20/14. She was found to have high grade LAD stenosis that shut down before PCI and led to V. Fib arrest. She was resuscitated and a DES was placed in the LAD. Dr. Claiborne Billings also noted strange appearance of flow down the moderate caliber intermediate branch. Plan is for re-look LHC today to reassess the intermediate branch. She denies any recurrent CP or dyspnea. HR and BP both stable. Continue DAPT with ASA + Brilinta, metoprolol, statin and ACE-I.    2. V. Fib Arrest: in the setting of LAD occlusion. S/p resuscitation and emergent LAD PCI. Stable.   3. HLD: LDL 117. Goal in the setting of CAD/ recent MI is <70 mg/dL. She is intolerant to Lipitor and Crestor. Continue Pravastatin.    LOS: 4 days    Shene Maxfield M. Rosita Fire, PA-C 12/24/2014 12:05 PM

## 2014-12-24 NOTE — Progress Notes (Signed)
Site area: rt groin fa sheath Site Prior to Removal:  Level   0 Pressure Applied For:  20 minutes Manual:   yes Patient Status During Pull:  stable Post Pull Site:  Level  0 Post Pull Instructions Given:  yes Post Pull Pulses Present: yes Dressing Applied:  tegaderm Bedrest begins @   1400  Comments:  0

## 2014-12-24 NOTE — H&P (View-Only) (Signed)
Subjective:  She had a mild amount of dyspnea when she was ambulating yesterday but none today.  No recurrent chest pain.  Objective:  Vital Signs in the last 24 hours: BP 135/57 mmHg  Pulse 72  Temp(Src) 98.7 F (37.1 C) (Oral)  Resp 18  Ht 5\' 1"  (1.549 m)  Wt 69.4 kg (153 lb)  BMI 28.92 kg/m2  SpO2 98%  Physical Exam: Pleasant female in no acute distress Lungs:  Clear Cardiac:  Regular rhythm, normal S1 and S2, no S3 Extremities:  No edema present  Intake/Output from previous day: 11/19 0701 - 11/20 0700 In: 1320 [P.O.:1320] Out: 700 [Urine:700]  Weight Filed Weights   12/21/14 0234 12/22/14 0325 12/23/14 0530  Weight: 71.9 kg (158 lb 8.2 oz) 69.7 kg (153 lb 10.6 oz) 69.4 kg (153 lb)    Lab Results: Basic Metabolic Panel:  Recent Labs  12/22/14 0326 12/23/14 0152  NA 138 137  K 3.4* 3.6  CL 106 107  CO2 24 24  GLUCOSE 106* 100*  BUN 10 11  CREATININE 0.77 0.78   CBC:  Recent Labs  12/21/14 0437 12/22/14 0326  WBC 11.6* 10.8*  HGB 11.5* 11.2*  HCT 33.6* 34.0*  MCV 88.7 89.7  PLT 239 223    Cardiac Panel (last 3 results)  Recent Labs  12/20/14 1546 12/20/14 1828  TROPONINI 56.64* 55.11*    Telemetry: Sinus rhythm.    Assessment/Plan:  1.  Evolving anterior infarction by EKG and enzymes 2.  Two-vessel coronary artery disease 3.  Ventricular tachycardia nonsustained noted 4.  Hypokalemia needs repletion  Recommendations:  EKG and enzymes suggest an anterior infarction.  Potassium given yesterday but needs additional today.  No further V. tach noted today.  Cardiac catheterization planned again for tomorrow to relook at intermediate branch.      Kerry Hough  MD [facility]  12/23/2014, 10:02 AM

## 2014-12-24 NOTE — Discharge Instructions (Signed)
Angiogram, Care After °Refer to this sheet in the next few weeks. These instructions provide you with information about caring for yourself after your procedure. Your health care provider may also give you more specific instructions. Your treatment has been planned according to current medical practices, but problems sometimes occur. Call your health care provider if you have any problems or questions after your procedure. °WHAT TO EXPECT AFTER THE PROCEDURE °After your procedure, it is typical to have the following: °· Bruising at the catheter insertion site that usually fades within 1-2 weeks. °· Blood collecting in the tissue (hematoma) that may be painful to the touch. It should usually decrease in size and tenderness within 1-2 weeks. °HOME CARE INSTRUCTIONS °· Take medicines only as directed by your health care provider. °· You may shower 24-48 hours after the procedure or as directed by your health care provider. Remove the bandage (dressing) and gently wash the site with plain soap and water. Pat the area dry with a clean towel. Do not rub the site, because this may cause bleeding. °· Do not take baths, swim, or use a hot tub until your health care provider approves. °· Check your insertion site every day for redness, swelling, or drainage. °· Do not apply powder or lotion to the site. °· Do not lift over 10 lb (4.5 kg) for 5 days after your procedure or as directed by your health care provider. °· Ask your health care provider when it is okay to: °¨ Return to work or school. °¨ Resume usual physical activities or sports. °¨ Resume sexual activity. °· Do not drive home if you are discharged the same day as the procedure. Have someone else drive you. °· You may drive 24 hours after the procedure unless otherwise instructed by your health care provider. °· Do not operate machinery or power tools for 24 hours after the procedure or as directed by your health care provider. °· If your procedure was done as an  outpatient procedure, which means that you went home the same day as your procedure, a responsible adult should be with you for the first 24 hours after you arrive home. °· Keep all follow-up visits as directed by your health care provider. This is important. °SEEK MEDICAL CARE IF: °· You have a fever. °· You have chills. °· You have increased bleeding from the catheter insertion site. Hold pressure on the site. °SEEK IMMEDIATE MEDICAL CARE IF: °· You have unusual pain at the catheter insertion site. °· You have redness, warmth, or swelling at the catheter insertion site. °· You have drainage (other than a small amount of blood on the dressing) from the catheter insertion site. °· The catheter insertion site is bleeding, and the bleeding does not stop after 30 minutes of holding steady pressure on the site. °· The area near or just beyond the catheter insertion site becomes pale, cool, tingly, or numb. °  °This information is not intended to replace advice given to you by your health care provider. Make sure you discuss any questions you have with your health care provider. °  °Document Released: 08/07/2004 Document Revised: 02/09/2014 Document Reviewed: 06/22/2012 °Elsevier Interactive Patient Education ©2016 [employer] ° °

## 2014-12-24 NOTE — Discharge Summary (Signed)
Physician Discharge Summary  Patient ID: Cathy Reilly MRN: [id number] DOB/AGE: [birth year] 74 y.o.  Primary Cardiologist: Dr. Claiborne Billings  Admit date: 12/20/2014 Discharge date: 12/24/2014  Admission Diagnoses: NSTEMI  Discharge Diagnoses:  Active Problems:   NSTEMI (non-ST elevated myocardial infarction) (HCC)   VT (ventricular tachycardia) (HCC)   VF (ventricular fibrillation) ([place])   Stented coronary artery   Discharged Condition: stable  Hospital Course: 74 y/o female with no prior cardiac history, but h/o HTN and hypothyroidism, who was admitted to [facility] on 12/20/14 for evaluation of chest pain and dyspnea. On arrival to the ED, she was noted to have inferolateral ST depression by ECG and troponin was elevated at 3.4, ruling her in for NSTEMI. Dr. Claiborne Billings performed her cath 12/20/14. She was found to have high grade LAD stenosis that shut down before PCI and led to V. Fib arrest. She was resuscitated with defibrillation and a DES was placed in the LAD. Dr. Claiborne Billings also noted strange appearance of flow down the moderate caliber intermediate branch. 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 On 12/24/14, she underwent a re-look cath to reassess the intermediate branch. The procedure was performed by Dr. Irish Lack. This revealed proximal LAD to mid LAD lesion that was 20% stenosed. This was the lesion that was previously treated with a DES. There appeared to be some plaque prolapse in the stented segment, but there was no obstructive disease. There was also resolution of the dissection in the ramus branch. The RCA was patent and LVEDP was normal. Continued medical therapy with agressive secondary prevention was recommended. She was placed on DAPT with ASA and Brilinta, BB therapy with metoprolol, ACE-I therapy with lisinopril and statin therapy with Pravastatin (intoleratant to Crestor and Lipitor, LDL 117). She had no recurrent chest pain an no dyspnea.  2D echo showed mild LV dysfunction with an EF of 50% with moderate hypokinesis of the mid-apicalateraoseptal myocardium.  She was monitored closely after her re-look cath on 12/24/14 and remained stable. She had no post cath complications. Dr. Oval Linsey felt that she was stable from a cardiac standpoint. She will f/u with Dr. Claiborne Billings or an APP in 1-2 weeks.  Consults: None   Significant Diagnostic Studies:  LHC 12/20/14 Coronary Findings    Dominance: Right   Left Anterior Descending   . Prox LAD lesion, 70% stenosed.   Marland Kitchen PCI: There is no pre-interventional antegrade distal flow (TIMI 0). Pre-stent angioplasty was performed. A drug-eluting stent was placed. The strut is apposed. Post-stent angioplasty was performed. The post-interventional distal flow is normal (TIMI 3). The intervention was successful. No complications occurred at this lesion.  . Supplies used: BALLOON EUPHORA [id number] 2.0X15; STENT XIENCE ALPINE RX 3.0X33; [place] [id number]  . There is no residual stenosis post intervention.     . Prox LAD to Mid LAD lesion, 99% stenosed. Eccentric ulcerative.   Marland Kitchen PCI: There is no pre-interventional antegrade distal flow (TIMI 0). Pre-stent angioplasty was performed. A drug-eluting stent was placed. The strut is apposed. Post-stent angioplasty was performed. The post-interventional distal flow is normal (TIMI 3). The intervention was successful. No complications occurred at this lesion.  . Supplies used: BALLOON EUPHORA [id number] 2.0X15; STENT XIENCE ALPINE RX 3.0X33; [place] [id number]  . There is no residual stenosis post intervention.     . First Diagonal Branch   The vessel is small in size.     Ramus Intermedius   . Ramus lesion, 90%  stenosed. Ulcerative. 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 The lesion was previously treated with a drug-eluting stent. There appears to be some  plaque prolapse in the stented segment but there is no obstructive disease.  Patent RCA.  Normal LVEDP.  Resolution of dissection in the ramus branch.   2D echo 12/20/14 Study Conclusions  - Left ventricle: The cavity size was normal. Wall thickness was increased in a pattern of mild LVH. Systolic function was mildly reduced. The estimated ejection fraction was in the range of 45% to 50%. There is moderate hypokinesis of the mid-apicalanteroseptal myocardium. Doppler parameters are consistent with high ventricular filling pressure. - Mitral valve: Calcified annulus. There was mild regurgitation. - Pulmonary arteries: PA peak pressure: 41 mm Hg (S).   Treatments: See Hospital Course  Discharge Exam: Blood pressure 126/51, pulse 81, temperature 98.4 F (36.9 C), temperature source Oral, resp. rate 15, height 5\' 1"  (1.549 m), weight 152 lb 3.2 oz (69.037 kg), SpO2 98 %.   Disposition: 01-Home or Self Care      Discharge Instructions    AMB Referral to Cardiac Rehabilitation - Phase II    Complete by:  As directed   Diagnosis:  Myocardial Infarction     Amb Referral to Cardiac Rehabilitation    Complete by:  As directed   Diagnosis:   Myocardial Infarction PCI       Diet - low sodium heart healthy    Complete by:  As directed      Increase activity slowly    Complete by:  As directed             Medication List    STOP taking these medications        lisinopril-hydrochlorothiazide 20-12.5 MG tablet  Commonly known as:  PRINZIDE,ZESTORETIC      TAKE these medications        amLODipine 5 MG tablet  Commonly known as:  NORVASC  Take 5 mg by mouth daily.     aspirin 81 MG tablet  Take 81 mg by mouth daily.     Co Q-10 100 MG Caps  Take by mouth.     HYDROcodone-acetaminophen 5-325 MG tablet  Commonly known as:  NORCO/VICODIN  Take 2 tablets by mouth every 4 (four) hours as needed for moderate pain or severe pain.     levothyroxine 88 MCG  tablet  Commonly known as:  SYNTHROID, LEVOTHROID  Take 88 mcg by mouth daily before breakfast.     lisinopril 5 MG tablet  Commonly known as:  PRINIVIL,ZESTRIL  Take 1 tablet (5 mg total) by mouth daily.     metoprolol tartrate 25 MG tablet  Commonly known as:  LOPRESSOR  Take 1 tablet (25 mg total) by mouth every 8 (eight) hours.     nitroGLYCERIN 0.4 MG SL tablet  Commonly known as:  NITROSTAT  Place 1 tablet (0.4 mg total) under the tongue every 5 (five) minutes x 3 doses as needed for chest pain.     pravastatin 80 MG tablet  Commonly known as:  PRAVACHOL  Take 1 tablet (80 mg total) by mouth daily at 6 PM.     ticagrelor 90 MG Tabs tablet  Commonly known as:  BRILINTA  Take 1 tablet (90 mg total) by mouth 2 (two) times daily.     ticagrelor 90 MG Tabs tablet  Commonly known  as:  BRILINTA  Take 1 tablet (90 mg total) by mouth 2 (two) times daily.       Follow-up Information    Follow up with Troy Sine, MD.   Specialty:  Cardiology   Why:  our office will call you with a hospital f/u with Dr. Claiborne Billings in 1-2 weeks   Contact information:   [address] [place] [address] [phone]       [place], [place]: >30 MINUTES  Signed: Lyda Jester 12/24/2014, 5:08 PM

## 2014-12-26 ENCOUNTER — Telehealth: Payer: Self-pay | Admitting: Cardiovascular Disease

## 2014-12-26 NOTE — Telephone Encounter (Signed)
Cathy Reilly is calling because she just had a cath last Thursday and she woke this morning w/ a dry cough and dizziness . Please call  Thanks

## 2014-12-26 NOTE — Telephone Encounter (Signed)
Spoke with pt, she feels like the dizziness is related to her sinuses. She reports feeling this way before when it was related to her sinuses. Her bp is 165/80, checked while on the phone. She has been on the lisinopril for awhile, prior to hospitalization.  She wants to know what to take OTC for her sinuses. She can use claritin, zyrtec, musonex and saline nasal spray. She will call back if cont to have problems.

## 2014-12-30 ENCOUNTER — Emergency Department (HOSPITAL_COMMUNITY)
Admission: EM | Admit: 2014-12-30 | Discharge: 2014-12-30 | Disposition: A | Discharge status: Home / Self Care | Payer: Commercial Managed Care - HMO | Attending: Emergency Medicine | Admitting: Emergency Medicine

## 2014-12-30 ENCOUNTER — Emergency Department (HOSPITAL_COMMUNITY): Payer: Commercial Managed Care - HMO

## 2014-12-30 ENCOUNTER — Encounter (HOSPITAL_COMMUNITY): Payer: Self-pay | Admitting: *Deleted

## 2014-12-30 DIAGNOSIS — I10 Essential (primary) hypertension: Secondary | ICD-10-CM | POA: Diagnosis not present

## 2014-12-30 DIAGNOSIS — Z7982 Long term (current) use of aspirin: Secondary | ICD-10-CM | POA: Insufficient documentation

## 2014-12-30 DIAGNOSIS — E079 Disorder of thyroid, unspecified: Secondary | ICD-10-CM | POA: Diagnosis not present

## 2014-12-30 DIAGNOSIS — R0602 Shortness of breath: Secondary | ICD-10-CM | POA: Diagnosis present

## 2014-12-30 DIAGNOSIS — J209 Acute bronchitis, unspecified: Secondary | ICD-10-CM | POA: Insufficient documentation

## 2014-12-30 DIAGNOSIS — Z79899 Other long term (current) drug therapy: Secondary | ICD-10-CM | POA: Insufficient documentation

## 2014-12-30 DIAGNOSIS — R05 Cough: Secondary | ICD-10-CM | POA: Diagnosis not present

## 2014-12-30 LAB — I-STAT TROPONIN, ED: TROPONIN I, POC: 0.05 ng/mL (ref 0.00–0.08)

## 2014-12-30 MED ORDER — GUAIFENESIN-CODEINE 100-10 MG/5ML PO SOLN: 5.0000 mL | Freq: Three times a day (TID) | ORAL | Status: DC | PRN

## 2014-12-30 MED ORDER — ALBUTEROL SULFATE HFA 108 (90 BASE) MCG/ACT IN AERS: 1.0000 | INHALATION_SPRAY | Freq: Four times a day (QID) | RESPIRATORY_TRACT | Status: DC | PRN

## 2014-12-30 MED ORDER — AZITHROMYCIN 250 MG PO TABS: 250.0000 mg | ORAL_TABLET | Freq: Every day | ORAL | Status: DC

## 2014-12-30 MED ORDER — PREDNISONE 20 MG PO TABS: 40.0000 mg | ORAL_TABLET | Freq: Every day | ORAL | Status: DC

## 2014-12-30 NOTE — ED Notes (Signed)
Pt c/o x 2 episodes of SOB since yesterday, the last episode woke her up from her sleep, pt states, "I have been taking Mucinex sinus." pt had MI last 12/20/14, pt denies CP, current SOB, n/v/d, pt states, "it feels like something is taking my breath." A&O x4

## 2014-12-30 NOTE — ED Provider Notes (Signed)
CSN: [id number]     Arrival date & time 12/30/14  1201 History   First MD Initiated Contact with Patient 12/30/14 1302     Chief Complaint  Patient presents with  . Shortness of Breath     (Consider location/radiation/quality/duration/timing/severity/associated sxs/prior Treatment) HPI   Patient is a 74 year old female with history of hypertension, NSTEMI 10 days ago with drug-eluting stent DAPT, presents emergency Department with chief complaint shortness of breath, she describes nasal congestion and productive cough since her discharge from the hospital with two concerning episodes of waking up at night feeling short of breath.  She states that during the day when she is up and about she does not feel short of breath.  She has been walking around the block without any exertional dyspnea. She further denies chest tightness, wheeze, chest pain, fever, chills, sweats, orthopnea, palpitations, lower extremity edema, near syncope, weakness or fatigue.  She contacted her cardiologist with her cold and cough symptoms was advised to use nasal spray and Mucinex which she has been using without much relief.  She does not have any smoking or asthma history.    Past Medical History  Diagnosis Date  . Hypertension   . Thyroid disease   . Osteopenia     hip   Past Surgical History  Procedure Laterality Date  . Cholecystectomy    . Appendectomy    . Tonsillectomy    . Cardiac catheterization N/A 12/20/2014    Procedure: Left Heart Cath and Coronary Angiography;  Surgeon: Troy Sine, MD;  Location: [facility];  Service: Cardiovascular;  Laterality: N/A;  . Cardiac catheterization N/A 12/20/2014    Procedure: Coronary Stent Intervention;  Surgeon: Troy Sine, MD;  Location: [facility];  Service: Cardiovascular;  Laterality: N/A;  . Cardiac catheterization N/A 12/24/2014    Procedure: Left Heart Cath and Coronary Angiography;  Surgeon: Jettie Booze, MD;  Location: [facility];  Service: Cardiovascular;  Laterality: N/A;  . Coronary angioplasty with stent placement     Family History  Problem Relation Age of Onset  . Hypertension Mother   . Hypertension Father   . Hypertension Sister   . Cancer Sister   . Hypertension Brother   . Cancer Brother   . CVA Brother   . Transient ischemic attack Sister    Social History  Substance Use Topics  . Smoking status: Never Smoker   . Smokeless tobacco: None  . Alcohol Use: No   OB History    No data available     Review of Systems  Constitutional: Negative for fever, chills, diaphoresis, activity change, appetite change and fatigue.  HENT: Positive for congestion. Negative for ear pain, facial swelling, postnasal drip, rhinorrhea and sore throat.   Eyes: Negative.   Respiratory: Positive for cough and shortness of breath. Negative for apnea, choking, chest tightness, wheezing and stridor.   Cardiovascular: Negative.  Negative for chest pain, palpitations and leg swelling.  Gastrointestinal: Negative.   Genitourinary: Negative.   Musculoskeletal: Negative.   Skin: Negative.  Negative for color change, pallor and rash.       Incisions healed  Neurological: Negative.  Negative for dizziness, syncope, weakness, light-headedness and headaches.  Psychiatric/Behavioral: Negative.    10 Systems reviewed and are negative for acute change except as noted in the HPI.    Allergies  Codeine; Crestor; Lipitor; Septra; Simvastatin; and Welchol  Home Medications   Prior to Admission medications   Medication Sig  Start Date End Date Taking? Authorizing Provider  amLODipine (NORVASC) 5 MG tablet Take 5 mg by mouth daily.    Historical Provider, MD  aspirin 81 MG tablet Take 81 mg by mouth daily.    Historical Provider, MD  Coenzyme Q10 (CO Q-10) 100 MG CAPS Take by mouth.    Historical Provider, MD  HYDROcodone-acetaminophen (NORCO/VICODIN) 5-325 MG per tablet Take 2 tablets by mouth every 4 (four) hours  as needed for moderate pain or severe pain. Patient not taking: Reported on 12/20/2014 03/23/14   Starlyn Skeans, PA-C  levothyroxine (SYNTHROID, LEVOTHROID) 88 MCG tablet Take 88 mcg by mouth daily before breakfast.    Historical Provider, MD  lisinopril (PRINIVIL,ZESTRIL) 5 MG tablet Take 1 tablet (5 mg total) by mouth daily. 12/24/14   Brittainy Erie Noe, PA-C  metoprolol tartrate (LOPRESSOR) 25 MG tablet Take 1 tablet (25 mg total) by mouth every 8 (eight) hours. 12/24/14   Brittainy Erie Noe, PA-C  nitroGLYCERIN (NITROSTAT) 0.4 MG SL tablet Place 1 tablet (0.4 mg total) under the tongue every 5 (five) minutes x 3 doses as needed for chest pain. 12/24/14   Brittainy Erie Noe, PA-C  pravastatin (PRAVACHOL) 80 MG tablet Take 1 tablet (80 mg total) by mouth daily at 6 PM. 12/24/14   Brittainy M Rosita Fire, PA-C  ticagrelor (BRILINTA) 90 MG TABS tablet Take 1 tablet (90 mg total) by mouth 2 (two) times daily. 12/24/14   Brittainy Erie Noe, PA-C  ticagrelor (BRILINTA) 90 MG TABS tablet Take 1 tablet (90 mg total) by mouth 2 (two) times daily. 12/24/14   Brittainy M Simmons, PA-C   BP 163/68 mmHg  Pulse 58  Temp(Src) 98.6 F (37 C) (Oral)  Resp 16  Ht 5\' 1"  (1.549 m)  Wt 68.085 kg  BMI 28.38 kg/m2  SpO2 99% Physical Exam  Constitutional: She is oriented to person, place, and time. She appears well-developed and well-nourished. No distress.  HENT:  Head: Normocephalic and atraumatic.  Right Ear: External ear normal.  Left Ear: External ear normal.  Nose: Nose normal.  Mouth/Throat: Oropharynx is clear and moist. No oropharyngeal exudate.  Eyes: Conjunctivae and EOM are normal. Pupils are equal, round, and reactive to light. Right eye exhibits no discharge. Left eye exhibits no discharge. No scleral icterus.  Neck: Normal range of motion. Neck supple. No JVD present. No tracheal deviation present. No thyromegaly present.  Cardiovascular: Normal rate, regular rhythm, normal heart sounds  and intact distal pulses.  Exam reveals no gallop and no friction rub.   No murmur heard. Symmetrical pulses palpated radial 2+ dorsal pedis 2+, no pretibial or pedal edema, normal capillary refill in all extremities  Pulmonary/Chest: Effort normal and breath sounds normal. No respiratory distress. She has no wheezes. She has no rales. She exhibits no tenderness.  Intermittent cough, patient able to speak in full and complete sentences without any tachypnea, no accessory muscle use, no cyanosis or clubbing  Abdominal: Soft. Bowel sounds are normal. She exhibits no distension and no mass. There is no tenderness. There is no rebound and no guarding.  Musculoskeletal: Normal range of motion. She exhibits no edema or tenderness.  Lymphadenopathy:    She has no cervical adenopathy.  Neurological: She is alert and oriented to person, place, and time. She has normal reflexes. No cranial nerve deficit. She exhibits normal muscle tone. Coordination normal.  Skin: Skin is warm and dry. No rash noted. She is not diaphoretic. No erythema. No pallor.  Psychiatric: She has a  normal mood and affect. Her behavior is normal. Judgment and thought content normal.  Nursing note and vitals reviewed.   ED Course  Procedures (including critical care time) Labs Review Labs Reviewed - No data to display  Imaging Review No results found. I have personally reviewed and evaluated these images and lab results as part of my medical decision-making.   EKG Interpretation   Date/Time:  Sunday December 30 2014 12:20:46 EST Ventricular Rate:  57 PR Interval:  140 QRS Duration: 92 QT Interval:  468 QTC Calculation: 455 R Axis:   97 Text Interpretation:  Sinus bradycardia Rightward axis Cannot rule out  Anterior infarct , age undetermined Abnormal ECG Sinus bradycardia  Rightward axis T wave abnormality No significant change since last tracing  Abnormal ekg Confirmed by Carmin Muskrat  MD [phone]) on 12/30/2014   12:26:57 PM      MDM   Final diagnoses:  None    Patient with shortness of breath and productive cough complaints with nasal congestion 10 days ago she had a NSTEMI, and has recovered nicely without any symptoms or complications.  Cough and SOB are most consistent with bronchitis, without systemic sx, such as fever, chills, sweats.  No concern for acute cardiac pathology.  The case was discussed with Dr. Vanita Panda, who ordered troponin.  Otherwise a chest x-ray and EKG was obtained, which showed no acute pathology or changes. Troponin was 0.05  Pt was discharged home with steroids, albuterol prn for SOB, zpak, and cough syrup per her requests to try cough syrup with codeine.  Filed Vitals:   12/30/14 1445 12/30/14 1500 12/30/14 1515 12/30/14 1530  BP: 136/58 159/65 147/57 147/69  Pulse: 59 64 61 61  Temp:      TempSrc:      Resp: 15 24 18 17   Height:      Weight:      SpO2: 100% 99% 97% 99%         Delsa Grana, PA-C 12/30/14 [place], MD 01/05/15 [phone]

## 2014-12-30 NOTE — Discharge Instructions (Signed)

## 2015-01-01 ENCOUNTER — Telehealth: Payer: Self-pay | Admitting: Cardiovascular Disease

## 2015-01-01 NOTE — Telephone Encounter (Signed)
Mrs. Mccrea is calling because she is feeling jittery and feels like her heart is racing , is taking Zpac and Steroids for Bronchitis.Marland Kitchen Please call    Thanks

## 2015-01-01 NOTE — Telephone Encounter (Signed)
Pt reports HR 67 and BP of 164/80  Advised to continue zpak and prednisone as prescribed by ED physician for her bronchitis, check HR and BP. If any new concerns call. Gave reassurance that jittery feeling should diminish once steroid course is finished.

## 2015-01-03 ENCOUNTER — Observation Stay (HOSPITAL_COMMUNITY)
Admission: EM | Admit: 2015-01-03 | Discharge: 2015-01-04 | Disposition: A | Discharge status: Home / Self Care | Payer: Commercial Managed Care - HMO | Attending: Cardiology | Admitting: Cardiology

## 2015-01-03 ENCOUNTER — Telehealth: Payer: Self-pay | Admitting: Cardiovascular Disease

## 2015-01-03 ENCOUNTER — Emergency Department (HOSPITAL_COMMUNITY): Payer: Commercial Managed Care - HMO

## 2015-01-03 ENCOUNTER — Encounter (HOSPITAL_COMMUNITY): Payer: Self-pay

## 2015-01-03 DIAGNOSIS — M8580 Other specified disorders of bone density and structure, unspecified site: Secondary | ICD-10-CM | POA: Diagnosis not present

## 2015-01-03 DIAGNOSIS — Z7982 Long term (current) use of aspirin: Secondary | ICD-10-CM | POA: Insufficient documentation

## 2015-01-03 DIAGNOSIS — I2510 Atherosclerotic heart disease of native coronary artery without angina pectoris: Secondary | ICD-10-CM

## 2015-01-03 DIAGNOSIS — I10 Essential (primary) hypertension: Secondary | ICD-10-CM | POA: Diagnosis not present

## 2015-01-03 DIAGNOSIS — I252 Old myocardial infarction: Secondary | ICD-10-CM | POA: Diagnosis not present

## 2015-01-03 DIAGNOSIS — I25118 Atherosclerotic heart disease of native coronary artery with other forms of angina pectoris: Secondary | ICD-10-CM | POA: Diagnosis present

## 2015-01-03 DIAGNOSIS — E785 Hyperlipidemia, unspecified: Secondary | ICD-10-CM | POA: Diagnosis not present

## 2015-01-03 DIAGNOSIS — E039 Hypothyroidism, unspecified: Secondary | ICD-10-CM | POA: Diagnosis not present

## 2015-01-03 DIAGNOSIS — R0789 Other chest pain: Secondary | ICD-10-CM | POA: Diagnosis not present

## 2015-01-03 DIAGNOSIS — F419 Anxiety disorder, unspecified: Secondary | ICD-10-CM | POA: Diagnosis not present

## 2015-01-03 DIAGNOSIS — Z955 Presence of coronary angioplasty implant and graft: Secondary | ICD-10-CM

## 2015-01-03 DIAGNOSIS — Z79899 Other long term (current) drug therapy: Secondary | ICD-10-CM | POA: Diagnosis not present

## 2015-01-03 DIAGNOSIS — E079 Disorder of thyroid, unspecified: Secondary | ICD-10-CM | POA: Insufficient documentation

## 2015-01-03 DIAGNOSIS — Z9861 Coronary angioplasty status: Secondary | ICD-10-CM | POA: Insufficient documentation

## 2015-01-03 DIAGNOSIS — R7303 Prediabetes: Secondary | ICD-10-CM

## 2015-01-03 DIAGNOSIS — I5031 Acute diastolic (congestive) heart failure: Secondary | ICD-10-CM | POA: Diagnosis present

## 2015-01-03 DIAGNOSIS — I214 Non-ST elevation (NSTEMI) myocardial infarction: Secondary | ICD-10-CM | POA: Diagnosis present

## 2015-01-03 DIAGNOSIS — R079 Chest pain, unspecified: Secondary | ICD-10-CM | POA: Diagnosis not present

## 2015-01-03 DIAGNOSIS — I4901 Ventricular fibrillation: Secondary | ICD-10-CM | POA: Diagnosis not present

## 2015-01-03 HISTORY — DX: Prediabetes: R73.03

## 2015-01-03 HISTORY — DX: Hypothyroidism, unspecified: E03.9

## 2015-01-03 HISTORY — DX: Anxiety disorder, unspecified: F41.9

## 2015-01-03 HISTORY — DX: Bronchitis, not specified as acute or chronic: J40

## 2015-01-03 HISTORY — DX: Ventricular fibrillation: I49.01

## 2015-01-03 HISTORY — DX: Atherosclerotic heart disease of native coronary artery without angina pectoris: I25.10

## 2015-01-03 HISTORY — DX: Non-ST elevation (NSTEMI) myocardial infarction: I21.4

## 2015-01-03 LAB — CREATININE, SERUM
CREATININE: 0.9 mg/dL (ref 0.44–1.00)
GFR calc Af Amer: >60 mL/min (ref >60)

## 2015-01-03 LAB — BASIC METABOLIC PANEL
Anion gap: 7 (ref 5–15)
BUN: 12 mg/dL (ref 6–20)
CO2: 26 mmol/L (ref 22–32)
Calcium: 9 mg/dL (ref 8.9–10.3)
Chloride: 107 mmol/L (ref 101–111)
Creatinine, Ser: 0.73 mg/dL (ref 0.44–1.00)
GFR calc Af Amer: >60 mL/min (ref >60)
GFR calc non Af Amer: >60 mL/min (ref >60)
Glucose, Bld: 105 mg/dL — ABNORMAL HIGH (ref 65–99)
Potassium: 3.8 mmol/L (ref 3.5–5.1)
Sodium: 140 mmol/L (ref 135–145)

## 2015-01-03 LAB — CBC WITH DIFFERENTIAL/PLATELET
Basophils Absolute: 0 10*3/uL (ref 0.0–0.1)
Basophils Relative: 0 %
Eosinophils Absolute: 0.1 10*3/uL (ref 0.0–0.7)
Eosinophils Relative: 1 %
HCT: 37.3 % (ref 36.0–46.0)
Hemoglobin: 12.6 g/dL (ref 12.0–15.0)
Lymphocytes Relative: 18 %
Lymphs Abs: 1.8 10*3/uL (ref 0.7–4.0)
MCH: 30 pg (ref 26.0–34.0)
MCHC: 33.8 g/dL (ref 30.0–36.0)
MCV: 88.8 fL (ref 78.0–100.0)
Monocytes Absolute: 0.5 10*3/uL (ref 0.1–1.0)
Monocytes Relative: 5 %
Neutro Abs: 7.5 10*3/uL (ref 1.7–7.7)
Neutrophils Relative %: 76 %
Platelets: 434 10*3/uL — ABNORMAL HIGH (ref 150–400)
RBC: 4.2 MIL/uL (ref 3.87–5.11)
RDW: 12.8 % (ref 11.5–15.5)
WBC: 9.9 10*3/uL (ref 4.0–10.5)

## 2015-01-03 LAB — CBC
HEMATOCRIT: 38.7 % (ref 36.0–46.0)
Hemoglobin: 13 g/dL (ref 12.0–15.0)
MCH: 29.6 pg (ref 26.0–34.0)
MCHC: 33.6 g/dL (ref 30.0–36.0)
MCV: 88.2 fL (ref 78.0–100.0)
PLATELETS: 462 10*3/uL — AB (ref 150–400)
RBC: 4.39 MIL/uL (ref 3.87–5.11)
RDW: 12.5 % (ref 11.5–15.5)
WBC: 9.9 10*3/uL (ref 4.0–10.5)

## 2015-01-03 LAB — BRAIN NATRIURETIC PEPTIDE: B Natriuretic Peptide: 552.5 pg/mL — ABNORMAL HIGH (ref 0.0–100.0)

## 2015-01-03 LAB — TROPONIN I: Troponin I: <0.03 ng/mL (ref <0.031)

## 2015-01-03 LAB — I-STAT TROPONIN, ED: TROPONIN I, POC: 0.01 ng/mL (ref 0.00–0.08)

## 2015-01-03 MED ORDER — LISINOPRIL 10 MG PO TABS
10.0000 mg | ORAL_TABLET | Freq: Every day | ORAL | Status: DC
  Administered 2015-01-04: 10 mg via ORAL
  Filled 2015-01-03: qty 1

## 2015-01-03 MED ORDER — ALBUTEROL SULFATE (2.5 MG/3ML) 0.083% IN NEBU: 2.5000 mg | INHALATION_SOLUTION | Freq: Four times a day (QID) | RESPIRATORY_TRACT | Status: DC | PRN

## 2015-01-03 MED ORDER — METOPROLOL TARTRATE 25 MG PO TABS
37.5000 mg | ORAL_TABLET | Freq: Two times a day (BID) | ORAL | Status: DC
  Administered 2015-01-03 – 2015-01-04 (×2): 37.5 mg via ORAL
  Filled 2015-01-03 (×2): qty 1

## 2015-01-03 MED ORDER — TICAGRELOR 90 MG PO TABS
90.0000 mg | ORAL_TABLET | Freq: Two times a day (BID) | ORAL | Status: DC
  Administered 2015-01-03 – 2015-01-04 (×2): 90 mg via ORAL
  Filled 2015-01-03 (×2): qty 1

## 2015-01-03 MED ORDER — HEPARIN SODIUM (PORCINE) 5000 UNIT/ML IJ SOLN
5000.0000 [IU] | Freq: Three times a day (TID) | INTRAMUSCULAR | Status: DC
  Administered 2015-01-03 – 2015-01-04 (×2): 5000 [IU] via SUBCUTANEOUS
  Filled 2015-01-03 (×2): qty 1

## 2015-01-03 MED ORDER — ASPIRIN 81 MG PO CHEW
81.0000 mg | CHEWABLE_TABLET | Freq: Every day | ORAL | Status: DC
  Administered 2015-01-04: 81 mg via ORAL
  Filled 2015-01-03: qty 1

## 2015-01-03 MED ORDER — ONDANSETRON HCL 4 MG/2ML IJ SOLN: 4.0000 mg | Freq: Four times a day (QID) | INTRAMUSCULAR | Status: DC | PRN

## 2015-01-03 MED ORDER — ACETAMINOPHEN 325 MG PO TABS: 650.0000 mg | ORAL_TABLET | ORAL | Status: DC | PRN

## 2015-01-03 MED ORDER — METOPROLOL TARTRATE 25 MG PO TABS
25.0000 mg | ORAL_TABLET | Freq: Once | ORAL | Status: AC
  Administered 2015-01-03: 25 mg via ORAL
  Filled 2015-01-03: qty 1

## 2015-01-03 MED ORDER — PRAVASTATIN SODIUM 40 MG PO TABS
80.0000 mg | ORAL_TABLET | Freq: Every day | ORAL | Status: DC
  Administered 2015-01-03: 80 mg via ORAL
  Filled 2015-01-03: qty 2

## 2015-01-03 MED ORDER — ALPRAZOLAM 0.25 MG PO TABS: 0.2500 mg | ORAL_TABLET | Freq: Once | ORAL | Status: DC

## 2015-01-03 MED ORDER — LEVOTHYROXINE SODIUM 88 MCG PO TABS
88.0000 ug | ORAL_TABLET | Freq: Every day | ORAL | Status: DC
  Administered 2015-01-04: 88 ug via ORAL
  Filled 2015-01-03: qty 1

## 2015-01-03 MED ORDER — AMLODIPINE BESYLATE 5 MG PO TABS
5.0000 mg | ORAL_TABLET | Freq: Every day | ORAL | Status: DC
  Administered 2015-01-04: 5 mg via ORAL
  Filled 2015-01-03: qty 1

## 2015-01-03 MED ORDER — NITROGLYCERIN 0.4 MG SL SUBL: 0.4000 mg | SUBLINGUAL_TABLET | SUBLINGUAL | Status: DC | PRN

## 2015-01-03 MED ORDER — CO Q-10 100 MG PO CAPS: 1.0000 | ORAL_CAPSULE | Freq: Every day | ORAL | Status: DC

## 2015-01-03 NOTE — ED Notes (Signed)
Pt ambulated to restroom. 

## 2015-01-03 NOTE — ED Provider Notes (Signed)
CSN: [id number]     Arrival date & time 01/03/15  1005 History   First MD Initiated Contact with Patient 01/03/15 1012     Chief Complaint  Patient presents with  . Chest Pain    HPI   74 year old female with a history of hypertension, recent and STEMI with drug-eluting stent presents today with chest tightness. Patient reports that last night she slowly developed centralized chest tightness, that persisted throughout the night until EMS arrival and administration of nitroglycerin and aspirin. On 12/24/2014 patient was admitted for an STEMI underwent catheterization. Patient went into V. fib arrest was resuscitated with defibrillation, drug-eluting stent was placed in the LAD. Patient was also noted to have a dissection during the time the cath. Patient was seen in the emergency room on 02/28/2014 for shortness of breath and diagnosed with bronchitis. She was placed on steroids, albuterol, azithromycin, cough medication. She reports the symptoms haven't improved. At the time of evaluation patient reports that the chest "tightness" has was completely resolved, she feels fine otherwise. She denies any fever, chills, nausea, vomiting, cough, shortness of breath, nausea or vomiting, lower extremity swelling or edema.  Past Medical History  Diagnosis Date  . Hypertension   . Thyroid disease   . Osteopenia     hip  . VF (ventricular fibrillation) during cath on 12/20/2014 from occluded mid LAD, s/p defib   . CAD (coronary artery disease), native coronary artery s/p DES to LAD 12/20/2014 01/03/2015  . Prediabetes Hgb A1C 6.0 01/03/2015  . NSTEMI (non-ST elevated myocardial infarction) ([place]) 12/2014  . Bronchitis 12/2014  . Hypothyroidism   . Anxiety    Past Surgical History  Procedure Laterality Date  . Cholecystectomy    . Appendectomy    . Tonsillectomy    . Cardiac catheterization N/A 12/20/2014    Procedure: Left Heart Cath and Coronary Angiography;  Surgeon: Troy Sine, MD;  Location:  [facility];  Service: Cardiovascular;  Laterality: N/A;  . Cardiac catheterization N/A 12/20/2014    Procedure: Coronary Stent Intervention;  Surgeon: Troy Sine, MD;  Location: [facility];  Service: Cardiovascular;  Laterality: N/A;  . Cardiac catheterization N/A 12/24/2014    Procedure: Left Heart Cath and Coronary Angiography;  Surgeon: Jettie Booze, MD;  Location: [facility];  Service: Cardiovascular;  Laterality: N/A;  . Coronary angioplasty with stent placement     Family History  Problem Relation Age of Onset  . Hypertension Mother   . Hypertension Father   . Hypertension Sister   . Cancer Sister   . Hypertension Brother   . Cancer Brother   . CVA Brother   . Transient ischemic attack Sister    Social History  Substance Use Topics  . Smoking status: Never Smoker   . Smokeless tobacco: Never Used  . Alcohol Use: No   OB History    No data available     Review of Systems  All other systems reviewed and are negative.   Allergies  Codeine; Crestor; Lipitor; Septra; Simvastatin; Welchol; and Prednisone  Home Medications   Prior to Admission medications   Medication Sig Start Date End Date Taking? Authorizing Provider  albuterol (PROVENTIL HFA;VENTOLIN HFA) 108 (90 BASE) MCG/ACT inhaler Inhale 1-2 puffs into the lungs every 6 (six) hours as needed for wheezing or shortness of breath. 12/30/14  Yes Delsa Grana, PA-C  amLODipine (NORVASC) 5 MG tablet Take 5 mg by mouth daily.   Yes Historical Provider, MD  aspirin 81 MG tablet Take 81 mg by mouth daily.   Yes Historical Provider, MD  Coenzyme Q10 (CO Q-10) 100 MG CAPS Take 1 capsule by mouth daily.    Yes Historical Provider, MD  levothyroxine (SYNTHROID, LEVOTHROID) 88 MCG tablet Take 88 mcg by mouth daily before breakfast.   Yes Historical Provider, MD  lisinopril (PRINIVIL,ZESTRIL) 5 MG tablet Take 1 tablet (5 mg total) by mouth daily. 12/24/14  Yes Brittainy Erie Noe, PA-C   metoprolol tartrate (LOPRESSOR) 25 MG tablet Take 1 tablet (25 mg total) by mouth every 8 (eight) hours. 12/24/14  Yes Brittainy Erie Noe, PA-C  pravastatin (PRAVACHOL) 80 MG tablet Take 1 tablet (80 mg total) by mouth daily at 6 PM. 12/24/14  Yes Brittainy Erie Noe, PA-C  ticagrelor (BRILINTA) 90 MG TABS tablet Take 1 tablet (90 mg total) by mouth 2 (two) times daily. 12/24/14  Yes Brittainy Erie Noe, PA-C  azithromycin (ZITHROMAX) 250 MG tablet Take 1 tablet (250 mg total) by mouth daily. Take first 2 tablets together, then 1 every day until finished. Patient not taking: Reported on 01/03/2015 12/30/14   Delsa Grana, PA-C  guaiFENesin-codeine 100-10 MG/5ML syrup Take 5 mLs by mouth 3 (three) times daily as needed for cough. 12/30/14   Delsa Grana, PA-C  HYDROcodone-acetaminophen (NORCO/VICODIN) 5-325 MG per tablet Take 2 tablets by mouth every 4 (four) hours as needed for moderate pain or severe pain. Patient not taking: Reported on 12/20/2014 03/23/14   Loma Sousa Forcucci, PA-C  nitroGLYCERIN (NITROSTAT) 0.4 MG SL tablet Place 1 tablet (0.4 mg total) under the tongue every 5 (five) minutes x 3 doses as needed for chest pain. 12/24/14   Brittainy Erie Noe, PA-C  predniSONE (DELTASONE) 20 MG tablet Take 2 tablets (40 mg total) by mouth daily. Patient not taking: Reported on 01/03/2015 12/30/14   Delsa Grana, PA-C  ticagrelor (BRILINTA) 90 MG TABS tablet Take 1 tablet (90 mg total) by mouth 2 (two) times daily. Patient not taking: Reported on 01/03/2015 12/24/14   Brittainy M Simmons, PA-C   BP 165/61 mmHg  Pulse 63  Temp(Src) 98.2 F (36.8 C) (Oral)  Resp 18  Ht 5\' 1"  (1.549 m)  Wt 66.225 kg  BMI 27.60 kg/m2  SpO2 98%   Physical Exam  Constitutional: She is oriented to person, place, and time. She appears well-developed and well-nourished.  HENT:  Head: Normocephalic and atraumatic.  Eyes: Conjunctivae are normal. Pupils are equal, round, and reactive to light. Right eye exhibits no  discharge. Left eye exhibits no discharge. No scleral icterus.  Neck: Normal range of motion. No JVD present. No tracheal deviation present.  Cardiovascular: Regular rhythm, normal heart sounds and intact distal pulses.  Exam reveals no gallop and no friction rub.   No murmur heard. Pulmonary/Chest: Effort normal and breath sounds normal. No stridor. No respiratory distress. She has no wheezes. She has no rales. She exhibits no tenderness.  Abdominal: Soft. Bowel sounds are normal. She exhibits no distension and no mass. There is no tenderness. There is no rebound and no guarding.  Musculoskeletal: Normal range of motion. She exhibits no edema or tenderness.  No lower extremity swelling or edema  Neurological: She is alert and oriented to person, place, and time. Coordination normal.  Skin: Skin is warm and dry. No rash noted. No erythema. No pallor.  Psychiatric: She has a normal mood and affect. Her behavior is normal. Judgment and thought content normal.  Nursing note and vitals reviewed.   ED Course  Procedures (including critical care time) Labs Review Labs Reviewed  CBC WITH DIFFERENTIAL/PLATELET - Abnormal; Notable for the following:    Platelets 434 (*)    All other components within normal limits  BASIC METABOLIC PANEL - Abnormal; Notable for the following:    Glucose, Bld 105 (*)    All other components within normal limits  BRAIN NATRIURETIC PEPTIDE - Abnormal; Notable for the following:    B Natriuretic Peptide 552.5 (*)    All other components within normal limits  CBC - Abnormal; Notable for the following:    Platelets 462 (*)    All other components within normal limits  BASIC METABOLIC PANEL - Abnormal; Notable for the following:    Glucose, Bld 100 (*)    All other components within normal limits  CREATININE, SERUM  TROPONIN I  TROPONIN I  TROPONIN I  I-STAT TROPOININ, ED    Imaging Review Dg Chest 2 View  01/03/2015  CLINICAL DATA:  Chest pain.  Diagnosed  with bronchitis 4 days ago. EXAM: CHEST  2 VIEW COMPARISON:  12/30/2014 FINDINGS: The cardiac silhouette, mediastinal and hilar contours are stable. Stable tortuosity and calcification of the thoracic aorta. Chronic bronchitic type lung changes and apical scarring. No focal airspace consolidation or pleural effusion. IMPRESSION: Chronic bronchitic changes but no infiltrates or effusions. Electronically Signed   By: Marijo Sanes M.D.   On: 01/03/2015 11:27   I have personally reviewed and evaluated these images and lab results as part of my medical decision-making.   EKG Interpretation   Date/Time:  Thursday January 03 2015 10:06:33 EST Ventricular Rate:  56 PR Interval:  121 QRS Duration: 93 QT Interval:  440 QTC Calculation: 425 R Axis:   86 Text Interpretation:  Sinus rhythm Borderline right axis deviation Abnrm  T, consider ischemia, anterolateral lds No significant change since last  tracing Abnormal ECG Confirmed by KNOTT MD, DANIEL [id number]) on 01/03/2015  10:12:54 AM      MDM   Final diagnoses:  Chest pain, unspecified chest pain type    Labs: Troponin, CBC, BMP- no significant findings  Imaging: ED EKG  Consults: Cardiology  Therapeutics:  Discharge Meds:   Assessment/Plan: 74 year old female with a history of recent STEMI with drug-eluting stent presents with chest pain, initial troponin negative, cardiology consult for further evaluation and management. Patient made stable here in the ED, symptoms had all but resolved. Cardiology to admit her observation         Okey Regal, PA-C 01/04/15 [id number]  Leo Grosser, MD 01/06/15 [phone]

## 2015-01-03 NOTE — Telephone Encounter (Signed)
Orders acknowledged and review of chart indicates ED to hospital admission.

## 2015-01-03 NOTE — ED Notes (Signed)
Floor has not assigned pt to RN at this time, will call back.

## 2015-01-03 NOTE — Telephone Encounter (Signed)
If pt did not go to ER, then add on to be seen tomorrow, if possible

## 2015-01-03 NOTE — Telephone Encounter (Signed)
Pt c/o chest pressure ~1 day. Describes as dull but has no SOB w/ it. She noticed last night and this AM.  Pt had recent NSTEMI, cath w PCI 11/17  Pt has had acute bronchitis, seen in ER 11/27. No cough for last 2-3 days.  Prior to this, for a few days she had a "jittery" feeling, now mostly resolved.  Had been taking steroids & antibiotics for acute bronchitis.   Advised to use nitro for CP, gave indications of use.  Recommended if no improvement, unsure, or if worse symptoms, -> ER for evaluation.  Sees [facility] on 12/8 for hosp f/u. Routed to Dr. Claiborne Billings for additional considerations.

## 2015-01-03 NOTE — H&P (Signed)
Patient ID: KYELEE BURLING MRN: [id number], DOB/AGE: May 30, 1940   Admit date: 01/03/2015   Primary Physician: Reginia Naas, MD Primary Cardiologist: Dr. Claiborne Billings  Pt. Profile:  Cathy Reilly is a pleasant 74 yo Caucasian female with PMH of prediabetes, HTN, HLD, hypothyroidism, CAD s/p recent anterior NSTEMI s/p DES to mid LAD presented with chest pain  Problem List  Past Medical History  Diagnosis Date  . Hypertension   . Thyroid disease   . Osteopenia     hip  . VF (ventricular fibrillation) during cath on 12/20/2014 from occluded mid LAD, s/p defib   . CAD (coronary artery disease), native coronary artery s/p DES to LAD 12/20/2014 01/03/2015  . Prediabetes Hgb A1C 6.0 01/03/2015    Past Surgical History  Procedure Laterality Date  . Cholecystectomy    . Appendectomy    . Tonsillectomy    . Cardiac catheterization N/A 12/20/2014    Procedure: Left Heart Cath and Coronary Angiography;  Surgeon: Troy Sine, MD;  Location: [facility];  Service: Cardiovascular;  Laterality: N/A;  . Cardiac catheterization N/A 12/20/2014    Procedure: Coronary Stent Intervention;  Surgeon: Troy Sine, MD;  Location: [facility];  Service: Cardiovascular;  Laterality: N/A;  . Cardiac catheterization N/A 12/24/2014    Procedure: Left Heart Cath and Coronary Angiography;  Surgeon: Jettie Booze, MD;  Location: [facility];  Service: Cardiovascular;  Laterality: N/A;  . Coronary angioplasty with stent placement       Allergies  Allergies  Allergen Reactions  . Codeine Other (See Comments)    Unknown reaction  . Crestor [Rosuvastatin] Other (See Comments)    myaglia  . Lipitor [Atorvastatin] Other (See Comments)    myaglia  . Septra [Sulfamethoxazole-Trimethoprim] Other (See Comments)    Unknown reaction  . Simvastatin Other (See Comments)    myaglia  . Welchol [Colesevelam Hcl] Other (See Comments)    myaglia  . Prednisone Palpitations     HPI  Cathy Reilly is a pleasant 74 yo Caucasian female with PMH of prediabetes, HTN, HLD, hypothyroidism, CAD s/p recent anterior NSTEMI s/p DES to mid LAD. Patient was originally seen by Dr. Harrington Challenger back in December 2015 for hyperlipidemia. She has known intolerance to multiple statin, carotid ultrasound was obtained which did not show significant plaque. I do not see any follow-up after that. She presented in November with anterior NSTEMI. EKG originally did not show ST elevation, however later during cardiac catheterization, it was noted she had 99% mid LAD disease which became completely occluded during the procedure. She also had possible 90% ramus dissection. Due to the complete occlusion of LAD during the procedure, she had V. tach/V. fib arrest requiring defibrillation. The LAD lesion was treated with 3.0 x 33 mm Xience Alpine DES. Postop EKG showed ST elevation in the anterior lead. Echocardiogram obtained on the same day showed EF 45-50%, moderate hypokinesis of mid apical anteroseptal myocardium, mild MR, PA peak pressure 41. She had a relook cardiac catheterization on 12/24/2014 which showed resolution of dissection in the ramus branch, 20% proximal to mid LAD lesion previously treated with DES, there appears to be some plaque prolapse in the stented segment but there is no obstructive disease. Patent RCA. Patient was placed on aspirin, Brilinta, statin, metoprolol, ACE inhibitor and amlodipine.  Since discharge, she has been doing well. She was seen in the ED recently for bronchitis and shortness of breath. She was treated with steroid, albuterol and  azithromycin. Her productive cough has since been improving. Yesterday afternoon, she started having chest pressure symptom in the afternoon while resting. She denies any obvious chest pain. The chest pressure feels different from her previous angina. According to the patient, when she first came in with NSTEMI, she was having unremitting dull ache  from substernal all the way up to the jaw. She eventually went to sleep with this chest pressure. Upon waking up in the morning of 01/03/2015, she noted the chest pressure did not go away overnight. She called Dr. Evette Georges office who instructed her to take a sublingual nitroglycerin, and if her symptom persist, she should seek medical attention. She took one sublingual nitroglycerin which eased the symptom off, however did not completely went away. She then proceeded to call EMS who gave her more sublingual nitroglycerin and aspirin which completely alleviated the symptoms. She denies any exacerbating and relieving factors. The symptom has not recurred since. On arrival to the ED at [facility], she was chest pain-free. Her troponin was negative. EKG showed persistent T wave inversion in V1 through V3 which is the same since last MI. Cardiology has been consulted for chest pain.   Home Medications  Prior to Admission medications   Medication Sig Start Date End Date Taking? Authorizing Provider  albuterol (PROVENTIL HFA;VENTOLIN HFA) 108 (90 BASE) MCG/ACT inhaler Inhale 1-2 puffs into the lungs every 6 (six) hours as needed for wheezing or shortness of breath. 12/30/14  Yes Delsa Grana, PA-C  amLODipine (NORVASC) 5 MG tablet Take 5 mg by mouth daily.   Yes Historical Provider, MD  aspirin 81 MG tablet Take 81 mg by mouth daily.   Yes Historical Provider, MD  Coenzyme Q10 (CO Q-10) 100 MG CAPS Take 1 capsule by mouth daily.    Yes Historical Provider, MD  levothyroxine (SYNTHROID, LEVOTHROID) 88 MCG tablet Take 88 mcg by mouth daily before breakfast.   Yes Historical Provider, MD  lisinopril (PRINIVIL,ZESTRIL) 5 MG tablet Take 1 tablet (5 mg total) by mouth daily. 12/24/14  Yes Brittainy Erie Noe, PA-C  metoprolol tartrate (LOPRESSOR) 25 MG tablet Take 1 tablet (25 mg total) by mouth every 8 (eight) hours. 12/24/14  Yes Brittainy Erie Noe, PA-C  pravastatin (PRAVACHOL) 80 MG tablet Take 1 tablet  (80 mg total) by mouth daily at 6 PM. 12/24/14  Yes Brittainy Erie Noe, PA-C  ticagrelor (BRILINTA) 90 MG TABS tablet Take 1 tablet (90 mg total) by mouth 2 (two) times daily. 12/24/14  Yes Brittainy Erie Noe, PA-C  azithromycin (ZITHROMAX) 250 MG tablet Take 1 tablet (250 mg total) by mouth daily. Take first 2 tablets together, then 1 every day until finished. Patient not taking: Reported on 01/03/2015 12/30/14   Delsa Grana, PA-C  guaiFENesin-codeine 100-10 MG/5ML syrup Take 5 mLs by mouth 3 (three) times daily as needed for cough. 12/30/14   Delsa Grana, PA-C  HYDROcodone-acetaminophen (NORCO/VICODIN) 5-325 MG per tablet Take 2 tablets by mouth every 4 (four) hours as needed for moderate pain or severe pain. Patient not taking: Reported on 12/20/2014 03/23/14   Loma Sousa Forcucci, PA-C  nitroGLYCERIN (NITROSTAT) 0.4 MG SL tablet Place 1 tablet (0.4 mg total) under the tongue every 5 (five) minutes x 3 doses as needed for chest pain. 12/24/14   Brittainy Erie Noe, PA-C  predniSONE (DELTASONE) 20 MG tablet Take 2 tablets (40 mg total) by mouth daily. Patient not taking: Reported on 01/03/2015 12/30/14   Delsa Grana, PA-C  ticagrelor (BRILINTA) 90 MG TABS tablet  Take 1 tablet (90 mg total) by mouth 2 (two) times daily. Patient not taking: Reported on 01/03/2015 12/24/14   Consuelo Pandy, PA-C    Family History  Family History  Problem Relation Age of Onset  . Hypertension Mother   . Hypertension Father   . Hypertension Sister   . Cancer Sister   . Hypertension Brother   . Cancer Brother   . CVA Brother   . Transient ischemic attack Sister     Social History  Social History   Social History  . Marital Status: Married    Spouse Name: N/A  . Number of Children: N/A  . Years of Education: N/A   Occupational History  . Not on file.   Social History Main Topics  . Smoking status: Never Smoker   . Smokeless tobacco: Not on file  . Alcohol Use: No  . Drug Use: No  . Sexual  Activity: Not on file   Other Topics Concern  . Not on file   Social History Narrative     Review of Systems General:  No chills, fever, night sweats or weight changes.  Cardiovascular:  No dyspnea on exertion, edema, orthopnea, palpitations, paroxysmal nocturnal dyspnea. +chest pain Dermatological: No rash, lesions/masses Respiratory: No cough, dyspnea Urologic: No hematuria, dysuria Abdominal:   No nausea, vomiting, diarrhea, bright red blood per rectum, melena, or hematemesis Neurologic:  No visual changes, wkns, changes in mental status. +faitgue. All other systems reviewed and are otherwise negative except as noted above.  Physical Exam  Blood pressure 152/67, pulse 61, temperature 97.7 F (36.5 C), temperature source Oral, resp. rate 19, SpO2 98 %.  General: Pleasant, NAD Psych: Normal affect. Neuro: Alert and oriented X 3. Moves all extremities spontaneously. HEENT: Normal  Neck: Supple without bruits or JVD. Lungs:  Resp regular and unlabored, CTA. Heart: RRR no s3, s4, or murmurs. Abdomen: Soft, non-tender, non-distended, BS + x 4.  Extremities: No clubbing, cyanosis or edema. DP/PT/Radials 2+ and equal bilaterally.  Labs  Troponin [facility] Test)  Recent Labs  01/03/15 1114  TROPIPOC 0.01   No results for input(s): CKTOTAL, CKMB, TROPONINI in the last 72 hours. Lab Results  Component Value Date   WBC 9.9 01/03/2015   HGB 12.6 01/03/2015   HCT 37.3 01/03/2015   MCV 88.8 01/03/2015   PLT 434* 01/03/2015     Recent Labs Lab 01/03/15 1000  NA 140  K 3.8  CL 107  CO2 26  BUN 12  CREATININE 0.73  CALCIUM 9.0  GLUCOSE 105*   Lab Results  Component Value Date   CHOL 175 12/21/2014   HDL 34* 12/21/2014   LDLCALC 117* 12/21/2014   TRIG 120 12/21/2014   No results found for: DDIMER   Radiology/Studies  Dg Chest 2 View  01/03/2015  CLINICAL DATA:  Chest pain.  Diagnosed with bronchitis 4 days ago. EXAM: CHEST  2 VIEW COMPARISON:   12/30/2014 FINDINGS: The cardiac silhouette, mediastinal and hilar contours are stable. Stable tortuosity and calcification of the thoracic aorta. Chronic bronchitic type lung changes and apical scarring. No focal airspace consolidation or pleural effusion. IMPRESSION: Chronic bronchitic changes but no infiltrates or effusions. Electronically Signed   By: Marijo Sanes M.D.   On: 01/03/2015 11:27   Dg Chest 2 View  12/30/2014  CLINICAL DATA:  Cough, congestion EXAM: CHEST  2 VIEW COMPARISON:  12/20/2014 FINDINGS: Normal mediastinum and cardiac silhouette. Chronic central bronchitic markings. Normal pulmonary vasculature. No effusion, infiltrate, or  pneumothorax. IMPRESSION: Chronic bronchitic markings.  No acute findings. Electronically Signed   By: Suzy Bouchard M.D.   On: 12/30/2014 13:46   Dg Chest 2 View  12/20/2014  CLINICAL DATA:  Chest pain and shortness of breath this morning. EXAM: CHEST  2 VIEW COMPARISON:  03/13/2009 FINDINGS: The cardiomediastinal contours are normal. There is atherosclerosis of the thoracic aorta. Pulmonary vasculature is normal. No consolidation, pleural effusion, or pneumothorax. No acute osseous abnormalities are seen. IMPRESSION: No acute pulmonary process. Electronically Signed   By: Jeb Levering M.D.   On: 12/20/2014 04:55    ECG  Normal sinus rhythm with T-wave inversion in V1 through V3  Echocardiogram 01/10/2015  LV EF: 45% -  50%  ------------------------------------------------------------------- Indications:   410.91 MI-unspecified.  ------------------------------------------------------------------- History:  PMH: VTACH. VFIB.  ------------------------------------------------------------------- Study Conclusions  - Left ventricle: The cavity size was normal. Wall thickness was increased in a pattern of mild LVH. Systolic function was mildly reduced. The estimated ejection fraction was in the range of 45% to 50%. There is  moderate hypokinesis of the mid-apicalanteroseptal myocardium. Doppler parameters are consistent with high ventricular filling pressure. - Mitral valve: Calcified annulus. There was mild regurgitation. - Pulmonary arteries: PA peak pressure: 41 mm Hg (S).    ASSESSMENT AND PLAN Principal Problem:   Chest pain with moderate risk for cardiac etiology - more consistent with DHF. Active Problems:   History of NSTEMI (non-ST elevated myocardial infarction) ([place])   Presence of drug coated stent in LAD coronary artery   Atherosclerotic heart disease of native coronary artery with other forms of angina pectoris (HCC)   Acute diastolic heart failure (HCC)   VF (ventricular fibrillation) during cath on 12/20/2014 from occluded mid LAD, s/p defib   Essential hypertension   Hyperlipidemia with target LDL less than 70   Hypothyroidism   Prediabetes Hgb A1C 6.0  1. Prolonged chest pain with negative trop and no EKG changes  - recent NSTEMI/STEMI s/p DES to mid LAD. Has been compliant with DAPT  - suspicion for ACS is low. Symptom different from her recent angina.   - admit overnight for obs. Given atypical symptom and negative trop after prolonged episode of chest pain, would not pursue invasive workup unless positive trop  2. CAD   - cath 99% mid LAD disease which became completely occluded during the procedure. She also had possible 90% ramus dissection. Due to the complete occlusion of LAD during the procedure, she had V. tach/V. fib arrest requiring defibrillation. The LAD lesion was treated with 3.0 x 33 mm Xience Alpine DES. Postop EKG showed ST elevation in the anterior lead.   - Echocardiogram obtained on the same day showed EF 45-50%, moderate hypokinesis of mid apical anteroseptal myocardium, mild MR, PA peak pressure 41.   - She had a relook cardiac catheterization on 12/24/2014 which showed resolution of dissection in the ramus branch, 20% proximal to mid LAD lesion previously treated  with DES, there appears to be some plaque prolapse in the stented segment but there is no obstructive disease. Patent RCA.  3. Prediabetes: A1C 6.0 last admission.   4. HTN: increase lisinopril to 10mg . Continue amlodipine, metoprolol 37.5mg  BID.  5. HLD 6. hypothyroidism  Signed, Almyra Deforest, PA-C 01/03/2015, 1:35 PM   I have seen, examined and evaluated the patient this PM along with Mr. Eulas Post, Hershal Coria in the ER.  After reviewing all the available data and chart,  I agree with his findings, examination as well as impression  recommendations.  Very pleasant 74 year old woman with recent "non-STEMI" that evolved into an anterior STEMI with a severe LAD lesion that occluded on the Cath Lab table.  This was associated with VT arrest that was defibrillated and proceeded with PCI to the LAD. She has preserved EF on echo with anterior wall motion abnormality to be expected. She has yet to be seen for her post hospital follow-up, now presents with a prolonged episode lasting almost overnight of some central sub-sternal chest pressure that is similar only in location to her MI related angina which was much more intense pain that was associated with dyspnea and just inability to couple. Her symptom was associated with low-grade of orthopnea which please me to believe that she may very well be having an acute diastolic heart failure exacerbation. Her symptoms improved after 3 nitroglycerin, but not shortly after nitroglycerin. This leads me to believe that it probably was related to a acute coronary event, but potentially due to hypertension and her symptoms improved as her blood pressure responded. Currently in the ER her blood pressure is 99991111 60 mmHg systolic range. She was higher than that, with recent MI, she clearly could have some acute diastolic dysfunction.   Plan: We'll admit tonight for observation and rule out MI. Low suspicion for this being MI, more likely related to diastolic dysfunction. Will  increase her ACE inhibitor dose, and consider adding Imdur. She's not currently noticing dyspnea, we may need to consider low-dose Lasix. We'll check a pro BNP to see if is any significant heart failure. Lungs are clear so probably just has pulmonary vascular congestion.  If she has any more significant symptoms, we may need to consider relook catheterization, however she did have a relook catheterization to reassess for possible dissection of the Ramus Intermedius, and she would hope to not have to have another invasive procedure unless indicated. I don't think a stress test at this particular junction would be useful this close to an MI.    Leonie Man, M.D., M.S. Interventional Cardiologist   Pager # [phone]

## 2015-01-03 NOTE — ED Notes (Signed)
Per EMS - pt hx MI 2 weeks ago. Pt came in Sunday c/o chest pain - dx w/ bronchitis. Pt given prednisone and abx. Pt felt heart was racing from prednisone and stopped taking. Pt short of breath but believes this is d/t bronchitis. Last night pt began experiencing substernal chest pressure. Pt took 2 nitro, called PCP and was advised to call EMS. Pt given 4 nitro by EMS - pain went from 10/10 to 7/10 to 6/10. Pt BP initially 99991111 systolic, last BP 0000000

## 2015-01-03 NOTE — ED Notes (Signed)
Family at bedside. 

## 2015-01-03 NOTE — Telephone Encounter (Signed)
Pt called in stating that she has been having some pressure in her chest and she is unsure of the cause. She says that she had a catheterization on 11/17 and was feeling fine but the she went to the ER on 11/27 for Bronchitis and is not sure if that is causing the pressure. Please f/u with her  Thanks

## 2015-01-04 DIAGNOSIS — E079 Disorder of thyroid, unspecified: Secondary | ICD-10-CM | POA: Diagnosis not present

## 2015-01-04 DIAGNOSIS — I2510 Atherosclerotic heart disease of native coronary artery without angina pectoris: Secondary | ICD-10-CM | POA: Diagnosis not present

## 2015-01-04 DIAGNOSIS — I5031 Acute diastolic (congestive) heart failure: Secondary | ICD-10-CM | POA: Diagnosis not present

## 2015-01-04 DIAGNOSIS — M8580 Other specified disorders of bone density and structure, unspecified site: Secondary | ICD-10-CM | POA: Diagnosis not present

## 2015-01-04 DIAGNOSIS — I4901 Ventricular fibrillation: Secondary | ICD-10-CM | POA: Diagnosis not present

## 2015-01-04 DIAGNOSIS — I25118 Atherosclerotic heart disease of native coronary artery with other forms of angina pectoris: Secondary | ICD-10-CM | POA: Diagnosis not present

## 2015-01-04 DIAGNOSIS — R079 Chest pain, unspecified: Secondary | ICD-10-CM | POA: Diagnosis not present

## 2015-01-04 DIAGNOSIS — I10 Essential (primary) hypertension: Secondary | ICD-10-CM | POA: Diagnosis not present

## 2015-01-04 DIAGNOSIS — E039 Hypothyroidism, unspecified: Secondary | ICD-10-CM | POA: Diagnosis not present

## 2015-01-04 DIAGNOSIS — I252 Old myocardial infarction: Secondary | ICD-10-CM | POA: Diagnosis not present

## 2015-01-04 DIAGNOSIS — E785 Hyperlipidemia, unspecified: Secondary | ICD-10-CM | POA: Diagnosis not present

## 2015-01-04 LAB — BASIC METABOLIC PANEL
Anion gap: 7 (ref 5–15)
BUN: 12 mg/dL (ref 6–20)
CHLORIDE: 104 mmol/L (ref 101–111)
CO2: 29 mmol/L (ref 22–32)
CREATININE: 0.9 mg/dL (ref 0.44–1.00)
Calcium: 9.4 mg/dL (ref 8.9–10.3)
GFR calc Af Amer: >60 mL/min (ref >60)
GFR calc non Af Amer: >60 mL/min (ref >60)
GLUCOSE: 100 mg/dL — AB (ref 65–99)
POTASSIUM: 4 mmol/L (ref 3.5–5.1)
SODIUM: 140 mmol/L (ref 135–145)

## 2015-01-04 LAB — TROPONIN I
Troponin I: <0.03 ng/mL (ref <0.031)
Troponin I: <0.03 ng/mL (ref <0.031)

## 2015-01-04 MED ORDER — AMLODIPINE BESYLATE 5 MG PO TABS: 5.0000 mg | ORAL_TABLET | Freq: Once | ORAL | Status: DC

## 2015-01-04 MED ORDER — AMLODIPINE BESYLATE 10 MG PO TABS: 10.0000 mg | ORAL_TABLET | Freq: Every day | ORAL | Status: DC

## 2015-01-04 MED ORDER — LISINOPRIL 10 MG PO TABS: 10.0000 mg | ORAL_TABLET | Freq: Every day | ORAL | Status: DC

## 2015-01-04 MED ORDER — METOPROLOL TARTRATE 25 MG PO TABS: 37.5000 mg | ORAL_TABLET | Freq: Two times a day (BID) | ORAL | Status: DC

## 2015-01-04 MED ORDER — AMLODIPINE BESYLATE 10 MG PO TABS: 5.0000 mg | ORAL_TABLET | Freq: Every day | ORAL | Status: DC

## 2015-01-04 MED ORDER — LISINOPRIL 10 MG PO TABS: 5.0000 mg | ORAL_TABLET | Freq: Every day | ORAL | Status: DC

## 2015-01-04 NOTE — Progress Notes (Signed)
Pt discharged to home via w/c, condition stable, discharge education completed with verbal understanding returned.  Edward Qualia RN

## 2015-01-04 NOTE — Discharge Summary (Addendum)
Discharge Summary   Patient ID: Cathy Reilly,  MRN: [id number], DOB/AGE: 10/16/1940 74 y.o.  Admit date: 01/03/2015 Discharge date: 01/04/2015  Primary Care Provider: [facility] Primary Cardiologist: Dr. Claiborne Billings  Discharge Diagnoses Principal Problem:   Chest pain with moderate risk for cardiac etiology - more consistent with DHF. 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 Codeine Other (See Comments)    Unknown reaction  . Crestor [Rosuvastatin] Other (See Comments)    myaglia  . Lipitor [Atorvastatin] Other (See Comments)    myaglia  . Septra [Sulfamethoxazole-Trimethoprim] Other (See Comments)    Unknown reaction  . Simvastatin Other (See Comments)    myaglia  . Welchol [Colesevelam Hcl] Other (See Comments)    myaglia  . Prednisone Palpitations    Hospital Course  Cathy Reilly is a pleasant 74 yo Caucasian female with PMH of prediabetes, HTN, HLD, hypothyroidism, CAD s/p recent anterior NSTEMI s/p DES to mid LAD. Patient was originally seen by Dr. Harrington Challenger back in December 2015 for hyperlipidemia. She has known intolerance to multiple statin, carotid ultrasound was obtained which did not show significant plaque. I do not see any follow-up after that. She presented in November with anterior NSTEMI. EKG originally did not show ST elevation, however later during cardiac catheterization, it was noted she had 99% mid LAD disease which became completely occluded during the procedure. She also  had possible 90% ramus dissection. Due to the complete occlusion of LAD during the procedure, she had V. tach/V. fib arrest requiring defibrillation. The LAD lesion was treated with 3.0 x 33 mm Xience Alpine DES. Postop EKG showed ST elevation in the anterior lead. Echocardiogram obtained on the same day showed EF 45-50%, moderate hypokinesis of mid apical anteroseptal myocardium, mild MR, PA peak pressure 41. She had a relook cardiac catheterization on 12/24/2014 which showed resolution of dissection in the ramus branch, 20% proximal to mid LAD lesion previously treated with DES, there appears to be some plaque prolapse in the stented segment but there is no obstructive disease. Patent RCA. Patient was placed on aspirin, Brilinta, statin, metoprolol, ACE inhibitor and amlodipine.  Patient presented to the hospital on 01/03/2015 with a prolonged episode of chest discomfort started on the night prior to admission. The total duration of chest discomfort lasted more than 10 hours. Her initial troponin was negative. She states the symptom is different from the previous angina. She was kept in the hospital overnight for observation. Serial troponin was negative. EKG did not show significant changes. She was ambulated by nursing staff prior to discharge, she denies any significant chest discomfort. Her blood pressure is mildly elevated, we have increased her lisinopril and amlodipine dose. We have consolidated her metoprolol 25mg  TID dose to 37.5mg  BID. We were unable to increase metoprolol dose further due to bradycardia. She is deemed stable for discharge from cardiology perspective. Outpatient followup previously arranged.    Discharge Vitals Blood pressure 165/61, pulse 63, temperature 98.2 F (36.8 C), temperature source Oral, resp. rate 18, height  5\' 1"  (1.549 m), weight 146 lb (66.225 kg), SpO2 98 %.  Filed Weights   01/03/15 1730 01/04/15 0619  Weight: 148 lb 9.6 oz (67.405 kg) 146 lb (66.225 kg)     Labs  CBC  Recent Labs  01/03/15 1000 01/03/15 1858  WBC 9.9 9.9  NEUTROABS 7.5  --   HGB 12.6 13.0  HCT 37.3 38.7  MCV 88.8 88.2  PLT 434* [id number]*   Basic Metabolic Panel  Recent Labs  01/03/15 1000 01/03/15 1858 01/04/15 0526  NA 140  --  140  K 3.8  --  4.0  CL 107  --  104  CO2 26  --  29  GLUCOSE 105*  --  100*  BUN 12  --  12  CREATININE 0.73 0.90 0.90  CALCIUM 9.0  --  9.4   Cardiac Enzymes  Recent Labs  01/03/15 1858 01/03/15 2316 01/04/15 0526  TROPONINI <0.03 <0.03 <0.03    Disposition  Pt is being discharged home today in good condition.  Follow-up Plans & Appointments      Follow-up Information    Follow up with Erlene Quan, PA-C On 01/10/2015.   Specialties:  Cardiology, Radiology   Why:  10:00am. Cardiology followup   Contact information:   [address] [place] [address] [phone]       Discharge Medications    Medication List    STOP taking these medications        azithromycin 250 MG tablet  Commonly known as:  ZITHROMAX     HYDROcodone-acetaminophen 5-325 MG tablet  Commonly known as:  NORCO/VICODIN     predniSONE 20 MG tablet  Commonly known as:  DELTASONE      TAKE these medications        albuterol 108 (90 BASE) MCG/ACT inhaler  Commonly known as:  PROVENTIL HFA;VENTOLIN HFA  Inhale 1-2 puffs into the lungs every 6 (six) hours as needed for wheezing or shortness of breath.     amLODipine 10 MG tablet  Commonly known as:  NORVASC  Take 1 tablet (10 mg total) by mouth daily. Take at nighttime     aspirin 81 MG tablet  Take 81 mg by mouth daily.     Co Q-10 100 MG Caps  Take 1 capsule by mouth daily.     guaiFENesin-codeine 100-10 MG/5ML syrup  Take 5 mLs by mouth 3 (three) times daily as needed for cough.     levothyroxine 88 MCG tablet  Commonly known as:  SYNTHROID, LEVOTHROID  Take 88 mcg by mouth daily before breakfast.     lisinopril 10 MG tablet  Commonly known as:   PRINIVIL,ZESTRIL  Take 1 tablet (10 mg total) by mouth daily.     metoprolol tartrate 25 MG tablet  Commonly known as:  LOPRESSOR  Take 1.5 tablets (37.5 mg total) by mouth 2 (two) times daily.     nitroGLYCERIN 0.4 MG SL tablet  Commonly known as:  NITROSTAT  Place 1 tablet (0.4 mg total) under the tongue every 5 (five) minutes x 3 doses as needed for chest pain.     pravastatin 80 MG tablet  Commonly known as:  PRAVACHOL  Take 1 tablet (80 mg total) by mouth daily at 6 PM.     ticagrelor 90 MG Tabs tablet  Commonly known as:  BRILINTA  Take 1 tablet (90 mg total) by mouth 2 (two) times daily.        Duration of Discharge Encounter  Greater than 30 minutes including physician time.  Hilbert Corrigan PA-C Pager: [phone] 01/04/2015, 12:54 PM   I have seen, examined and evaluated the patient this AM along with Mr. Eulas Post, [place].  After reviewing all the available data and chart,  I agree with his summary of hospital events..  Patient overall feels much better. Blood pressure somewhat improved, but still elevated. No longer having any significant orthopnea, PND or chest tightness. Was able to ambulate in hall without any active symptoms. This makes acute coronary syndrome very unlikely as one would expect resting symptom to be exacerbated with movement. I do think this is probably related to mild acute diastolic heart failure. We have increased ACE inhibitor 10 mg and will also increase amlodipine to 10 mg with lisinopril being given in the morning and amlodipine at night. We have also consolidated the metoprolol to 37.5 twice a day from 25 3 times a day for a accommodating adherence. Caryl Comes Not actively having heart failure symptoms, it'll be good this time we need to have a diuretic, but we need to consider that in the future if she continues to have some orthopnea symptoms.  She is ruled out for MI, and no active symptoms, they she is stable for discharge home since she walked  today without any symptoms.    Leonie Man, M.D., M.S. Interventional Cardiologist   Pager # [phone]

## 2015-01-04 NOTE — Progress Notes (Signed)
Patient Name: Cathy Reilly Date of Encounter: 01/04/2015  Primary Cardiologist: Dr. Claiborne Billings   Principal Problem:   Chest pain with moderate risk for cardiac etiology - more consistent with DHF. 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 Feeling good overnight with no recurrence of chest discomfort.  CURRENT MEDS . ALPRAZolam  0.25 mg Oral Once  . amLODipine  5 mg Oral Daily  . aspirin  81 mg Oral Daily  . heparin  5,000 Units Subcutaneous 3 times per day  . levothyroxine  88 mcg Oral QAC breakfast  . lisinopril  10 mg Oral Daily  . metoprolol tartrate  37.5 mg Oral BID  . pravastatin  80 mg Oral q1800  . ticagrelor  90 mg Oral BID    OBJECTIVE  Filed Vitals:   01/03/15 1730 01/03/15 2034 01/03/15 2157 01/04/15 0619  BP: 156/63 148/56  165/61  Pulse: 62 59 61 63  Temp: 98.5 F (36.9 C) 98.9 F (37.2 C)  98.2 F (36.8 C)  TempSrc: Oral Oral  Oral  Resp: 18     Height: 5\' 1"  (1.549 m)     Weight: 148 lb 9.6 oz (67.405 kg)   146 lb (66.225 kg)  SpO2: 98% 97%  98%    Intake/Output Summary (Last 24 hours) at 01/04/15 0935 Last data filed at 01/04/15 [id number]  Gross per 24 hour  Intake    120 ml  Output    150 ml  Net    -30 ml   Filed Weights   01/03/15 1730 01/04/15 0619  Weight: 148 lb 9.6 oz (67.405 kg) 146 lb (66.225 kg)    PHYSICAL EXAM  General: Pleasant, NAD. Neuro: Alert and oriented X 3. Moves all extremities spontaneously. Psych: Normal affect. HEENT:  Normal  Neck: Supple  without bruits or JVD. Lungs:  Resp regular and unlabored, CTA. Heart: RRR no s3, s4, or murmurs. Abdomen: Soft, non-tender, non-distended, BS + x 4.  Extremities: No clubbing, cyanosis or edema. DP/PT/Radials 2+ and equal bilaterally.  Accessory Clinical Findings  CBC  Recent Labs  01/03/15 1000 01/03/15 1858  WBC 9.9 9.9  NEUTROABS 7.5  --   HGB 12.6 13.0  HCT 37.3 38.7  MCV 88.8 88.2  PLT 434* [id number]*   Basic Metabolic Panel  Recent Labs  01/03/15 1000 01/03/15 1858 01/04/15 0526  NA 140  --  140  K 3.8  --  4.0  CL 107  --  104  CO2 26  --  29  GLUCOSE 105*  --  100*  BUN 12  --  12  CREATININE 0.73 0.90 0.90  CALCIUM 9.0  --  9.4   Cardiac Enzymes  Recent Labs  01/03/15 1858 01/03/15 2316 01/04/15 0526  TROPONINI <0.03 <0.03 <0.03   TELE Sinus brady with HR 40-50s    ECG  NSR with TWI in V2-V5 and aVL.   Echocardiogram 12/20/2014  LV EF: 45% -  50%  ------------------------------------------------------------------- Indications:   410.91 MI-unspecified.  -------------------------------------------------------------------  History:  PMH: VTACH. VFIB.  ------------------------------------------------------------------- Study Conclusions  - Left ventricle: The cavity size was normal. Wall thickness was increased in a pattern of mild LVH. Systolic function was mildly reduced. The estimated ejection fraction was in the range of 45% to 50%. There is moderate hypokinesis of the mid-apicalanteroseptal myocardium. Doppler parameters are consistent with high ventricular filling pressure. - Mitral valve: Calcified annulus. There was mild regurgitation. - Pulmonary arteries: PA peak pressure: 41 mm Hg (S).    Radiology/Studies  Dg Chest 2 View  01/03/2015  CLINICAL DATA:  Chest pain.  Diagnosed with bronchitis 4 days ago. EXAM: CHEST  2 VIEW COMPARISON:  12/30/2014 FINDINGS: The cardiac silhouette, mediastinal and hilar contours are  stable. Stable tortuosity and calcification of the thoracic aorta. Chronic bronchitic type lung changes and apical scarring. No focal airspace consolidation or pleural effusion. IMPRESSION: Chronic bronchitic changes but no infiltrates or effusions. Electronically Signed   By: Marijo Sanes M.D.   On: 01/03/2015 11:27   Dg Chest 2 View  12/30/2014  CLINICAL DATA:  Cough, congestion EXAM: CHEST  2 VIEW COMPARISON:  12/20/2014 FINDINGS: Normal mediastinum and cardiac silhouette. Chronic central bronchitic markings. Normal pulmonary vasculature. No effusion, infiltrate, or pneumothorax. IMPRESSION: Chronic bronchitic markings.  No acute findings. Electronically Signed   By: Suzy Bouchard M.D.   On: 12/30/2014 13:46   Dg Chest 2 View  12/20/2014  CLINICAL DATA:  Chest pain and shortness of breath this morning. EXAM: CHEST  2 VIEW COMPARISON:  03/13/2009 FINDINGS: The cardiomediastinal contours are normal. There is atherosclerosis of the thoracic aorta. Pulmonary vasculature is normal. No consolidation, pleural effusion, or pneumothorax. No acute osseous abnormalities are seen. IMPRESSION: No acute pulmonary process. Electronically Signed   By: Jeb Levering M.D.   On: 12/20/2014 04:55    ASSESSMENT AND PLAN  1. Prolonged chest pain with negative trop and no EKG changes - recent NSTEMI/STEMI s/p DES to mid LAD. Has been compliant with DAPT - no recurrence of chest pain overnight. Denies any SOB. Still a little residual cough. Serial trop negative. EKG reviewed still has anterior TWI, but no noticeable TWI in V4, and new TWI in V5. Nurse to ambulate with the patient, will discuss with MD regarding slight EKG changes this morning compare to yesterday. If no relook cath, then potentially discharge today.  2. CAD  - cath 12/20/2014 99% mid LAD disease which became completely occluded during the procedure. She also had possible 90% ramus dissection. Due to the  complete occlusion of LAD during the procedure, she had V. tach/V. fib arrest requiring defibrillation. The LAD lesion was treated with 3.0 x 33 mm Xience Alpine DES. Postop EKG showed ST elevation in the anterior lead.  - Echocardiogram obtained on the same day showed EF 45-50%, moderate hypokinesis of mid apical anteroseptal myocardium, mild MR, PA peak pressure 41.  - She had a relook cardiac catheterization on 12/24/2014 which showed resolution of dissection in the ramus branch, 20% proximal to mid LAD lesion previously treated with DES, there appears to be some plaque prolapse in the stented segment but there is no obstructive disease. Patent RCA.  3. Prediabetes: A1C 6.0 last admission.  4. HTN: increase lisinopril to 10mg . Continue amlodipine, metoprolol 37.5mg  BID.  5. HLD 6. hypothyroidism  Signed, Woodward Ku Pager: [phone]    I have seen, examined and evaluated the patient this AM along with Mr. Eulas Post, [place].  After reviewing all the available data and chart,  I agree  with his findings, examination as well as impression recommendations.  Patient overall feels much better. Blood pressure somewhat improved, but still elevated. No longer having any significant orthopnea, PND or chest tightness. Was able to ambulate in hall without any active symptoms. This makes acute coronary syndrome very unlikely as one would expect resting symptom to be exacerbated with movement. I do think this is probably related to mild acute diastolic heart failure. We have increased ACE inhibitor 10 mg and will also increase amlodipine to 10 mg with lisinopril being given in the morning and amlodipine at night. We have also consolidated the metoprolol to 37.5 twice a day from 25 3 times a day for a accommodating adherence. Caryl Comes Not actively having heart failure symptoms, it'll be good this time we need to have a diuretic, but we need to consider that in the future if she continues to  have some orthopnea symptoms.  She is ruled out for MI, and no active symptoms, they she is stable for discharge home since she walked today without any symptoms.    Leonie Man, M.D., M.S. Interventional Cardiologist   Pager # [phone]

## 2015-01-04 NOTE — Care Management Obs Status (Signed)
[place] NOTIFICATION   Patient Details  Name: Cathy Reilly MRN: [id number] Date of Birth: 26-Aug-1940   Medicare Observation Status Notification Given:  Yes    Bethena Roys, RN 01/04/2015, 10:00 AM

## 2015-01-04 NOTE — Progress Notes (Signed)
Pt ambulated both halls and circle with no exertional chest pain, denies SOB, chest pressure or discomfort.  Edward Qualia RN

## 2015-01-04 NOTE — Discharge Instructions (Signed)
Chest Pain Observation °It is often hard to give a specific diagnosis for the cause of chest pain. Among other possibilities your symptoms might be caused by inadequate oxygen delivery to your heart (angina). Angina that is not treated or evaluated can lead to a heart attack (myocardial infarction) or death. °Blood tests, electrocardiograms, and X-rays may have been done to help determine a possible cause of your chest pain. After evaluation and observation, your health care provider has determined that it is unlikely your pain was caused by an unstable condition that requires hospitalization. However, a full evaluation of your pain may need to be completed, with additional diagnostic testing as directed. It is very important to keep your follow-up appointments. Not keeping your follow-up appointments could result in permanent heart damage, disability, or death. If there is any problem keeping your follow-up appointments, you must call your health care provider. °HOME CARE INSTRUCTIONS  °Due to the slight chance that your pain could be angina, it is important to follow your health care provider's treatment plan and also maintain a healthy lifestyle: °· Maintain or work toward achieving a healthy weight. °· Stay physically active and exercise regularly. °· Decrease your salt intake. °· Eat a balanced, healthy diet. Talk to a dietitian to learn about heart-healthy foods. °· Increase your fiber intake by including whole grains, vegetables, fruits, and nuts in your diet. °· Avoid situations that cause stress, anger, or depression. °· Take medicines as advised by your health care provider. Report any side effects to your health care provider. Do not stop medicines or adjust the dosages on your own. °· Quit smoking. Do not use nicotine patches or gum until you check with your health care provider. °· Keep your blood pressure, blood sugar, and cholesterol levels within normal limits. °· Limit alcohol intake to no more than  1 drink per day for women who are not pregnant and 2 drinks per day for men. °· Do not abuse drugs. °SEEK IMMEDIATE MEDICAL CARE IF: °You have severe chest pain or pressure which may include symptoms such as: °· You feel pain or pressure in your arms, neck, jaw, or back. °· You have severe back or abdominal pain, feel sick to your stomach (nauseous), or throw up (vomit). °· You are sweating profusely. °· You are having a fast or irregular heartbeat. °· You feel short of breath while at rest. °· You notice increasing shortness of breath during rest, sleep, or with activity. °· You have chest pain that does not get better after rest or after taking your usual medicine. °· You wake from sleep with chest pain. °· You are unable to sleep because you cannot breathe. °· You develop a frequent cough or you are coughing up blood. °· You feel dizzy, faint, or experience extreme fatigue. °· You develop severe weakness, dizziness, fainting, or chills. °Any of these symptoms may represent a serious problem that is an emergency. Do not wait to see if the symptoms will go away. Call your local emergency services (911 in the [place] Do not drive yourself to the hospital. °MAKE SURE YOU: °· Understand these instructions. °· Will watch your condition. °· Will get help right away if you are not doing well or get worse. °  °This information is not intended to replace advice given to you by your health care provider. Make sure you discuss any questions you have with your health care provider. °  °Document Released: 02/21/2010 Document Revised: 01/24/2013 Document Reviewed: 07/21/2012 °Elsevier Interactive Patient   Education 2016 [place].  Chest Pain Observation It is often hard to give a specific diagnosis for the cause of chest pain. Among other possibilities your symptoms might be caused by inadequate oxygen delivery to your heart (angina). Angina that is not treated or evaluated can lead to a heart attack (myocardial  infarction) or death. Blood tests, electrocardiograms, and X-rays may have been done to help determine a possible cause of your chest pain. After evaluation and observation, your health care provider has determined that it is unlikely your pain was caused by an unstable condition that requires hospitalization. However, a full evaluation of your pain may need to be completed, with additional diagnostic testing as directed. It is very important to keep your follow-up appointments. Not keeping your follow-up appointments could result in permanent heart damage, disability, or death. If there is any problem keeping your follow-up appointments, you must call your health care provider. HOME CARE INSTRUCTIONS  Due to the slight chance that your pain could be angina, it is important to follow your health care provider's treatment plan and also maintain a healthy lifestyle:  Maintain or work toward achieving a healthy weight.  Stay physically active and exercise regularly.  Decrease your salt intake.  Eat a balanced, healthy diet. Talk to a dietitian to learn about heart-healthy foods.  Increase your fiber intake by including whole grains, vegetables, fruits, and nuts in your diet.  Avoid situations that cause stress, anger, or depression.  Take medicines as advised by your health care provider. Report any side effects to your health care provider. Do not stop medicines or adjust the dosages on your own.  Quit smoking. Do not use nicotine patches or gum until you check with your health care provider.  Keep your blood pressure, blood sugar, and cholesterol levels within normal limits.  Limit alcohol intake to no more than 1 drink per day for women who are not pregnant and 2 drinks per day for men.  Do not abuse drugs. SEEK IMMEDIATE MEDICAL CARE IF: You have severe chest pain or pressure which may include symptoms such as:  You feel pain or pressure in your arms, neck, jaw, or back.  You have  severe back or abdominal pain, feel sick to your stomach (nauseous), or throw up (vomit).  You are sweating profusely.  You are having a fast or irregular heartbeat.  You feel short of breath while at rest.  You notice increasing shortness of breath during rest, sleep, or with activity.  You have chest pain that does not get better after rest or after taking your usual medicine.  You wake from sleep with chest pain.  You are unable to sleep because you cannot breathe.  You develop a frequent cough or you are coughing up blood.  You feel dizzy, faint, or experience extreme fatigue.  You develop severe weakness, dizziness, fainting, or chills. Any of these symptoms may represent a serious problem that is an emergency. Do not wait to see if the symptoms will go away. Call your local emergency services (911 in the [place] Do not drive yourself to the hospital. MAKE SURE YOU:  Understand these instructions.  Will watch your condition.  Will get help right away if you are not doing well or get worse.   This information is not intended to replace advice given to you by your health care provider. Make sure you discuss any questions you have with your health care provider.   Document Released: 02/21/2010 Document Revised: 01/24/2013  Document Reviewed: 07/21/2012 Elsevier Interactive Patient Education [employer].

## 2015-01-10 ENCOUNTER — Ambulatory Visit (INDEPENDENT_AMBULATORY_CARE_PROVIDER_SITE_OTHER): Payer: Commercial Managed Care - HMO | Admitting: Cardiology

## 2015-01-10 ENCOUNTER — Encounter: Payer: Self-pay | Admitting: Cardiology

## 2015-01-10 VITALS — BP 128/70 | HR 63 | Ht 61.0 in | Wt 148.1 lb

## 2015-01-10 DIAGNOSIS — Z79899 Other long term (current) drug therapy: Secondary | ICD-10-CM

## 2015-01-10 DIAGNOSIS — Z9861 Coronary angioplasty status: Secondary | ICD-10-CM

## 2015-01-10 DIAGNOSIS — I214 Non-ST elevation (NSTEMI) myocardial infarction: Secondary | ICD-10-CM | POA: Diagnosis not present

## 2015-01-10 DIAGNOSIS — E785 Hyperlipidemia, unspecified: Secondary | ICD-10-CM | POA: Diagnosis not present

## 2015-01-10 DIAGNOSIS — I4901 Ventricular fibrillation: Secondary | ICD-10-CM

## 2015-01-10 DIAGNOSIS — I2510 Atherosclerotic heart disease of native coronary artery without angina pectoris: Secondary | ICD-10-CM | POA: Diagnosis not present

## 2015-01-10 DIAGNOSIS — I10 Essential (primary) hypertension: Secondary | ICD-10-CM

## 2015-01-10 NOTE — Patient Instructions (Signed)
Medication Instructions:  Please continue your current medications.  Labwork: Your physician recommends that you return for lab work in 3 months - FASTING.  Testing/Procedures: NONE  Follow-Up: Kerin Ransom, PA-C, recommends that you schedule a follow-up appointment in 3 months with Dr Claiborne Billings. You will receive a reminder letter in the mail two months in advance. If you don't receive a letter, please call our office to schedule the follow-up appointment.  If you need a refill on your cardiac medications before your next appointment, please call your pharmacy.   Lurena Joiner has referred you to Cardiac Rehab. They will be in touch with you to set it up.

## 2015-01-10 NOTE — Progress Notes (Signed)
01/10/2015 Cathy Reilly   06-Apr-1940  [id number]  Primary Physician Reginia Naas, MD Primary Cardiologist: Dr Claiborne Billings  HPI:  74 y/o female who presented with a NSTEMI 12/20/14. At cath she had LAD disease that occluded during the procedure resulting in VF requiring defibrillation. She ultimately received an LAD DES with goof results. EF was 45-50% at cath. After discharge she developed bronchitis and was placed on steroids and a Z Pak (no PPI). She presented to the ED with chest discomfort and a BNP of 552. She was felt to have diastolic CHF and chest pain related to that. She is in the office today for follow up. She has been doing well, no chest pain, no, palpitations or syncope.   Current Outpatient Prescriptions  Medication Sig Dispense Refill  . ALPRAZolam (XANAX) 0.25 MG tablet Take 0.25 mg by mouth at bedtime as needed for anxiety (take half tab during the day if needed).    Marland Kitchen amLODipine (NORVASC) 10 MG tablet Take 1 tablet (10 mg total) by mouth daily. Take at nighttime 30 tablet 11  . aspirin 81 MG tablet Take 81 mg by mouth daily.    Marland Kitchen levothyroxine (SYNTHROID, LEVOTHROID) 88 MCG tablet Take 88 mcg by mouth daily before breakfast.    . lisinopril (PRINIVIL,ZESTRIL) 10 MG tablet Take 1 tablet (10 mg total) by mouth daily. 30 tablet 11  . metoprolol tartrate (LOPRESSOR) 25 MG tablet Take 1.5 tablets (37.5 mg total) by mouth 2 (two) times daily. 90 tablet 5  . nitroGLYCERIN (NITROSTAT) 0.4 MG SL tablet Place 1 tablet (0.4 mg total) under the tongue every 5 (five) minutes x 3 doses as needed for chest pain. 25 tablet 2  . pravastatin (PRAVACHOL) 80 MG tablet Take 1 tablet (80 mg total) by mouth daily at 6 PM. 30 tablet 5  . ticagrelor (BRILINTA) 90 MG TABS tablet Take 1 tablet (90 mg total) by mouth 2 (two) times daily. 60 tablet 10   No current facility-administered medications for this visit.    Allergies  Allergen Reactions  . Codeine Other (See Comments)    Unknown  reaction  . Crestor [Rosuvastatin] Other (See Comments)    myaglia  . Lipitor [Atorvastatin] Other (See Comments)    myaglia  . Septra [Sulfamethoxazole-Trimethoprim] Other (See Comments)    Unknown reaction  . Simvastatin Other (See Comments)    myaglia  . Welchol [Colesevelam Hcl] Other (See Comments)    myaglia  . Prednisone Palpitations    Social History   Social History  . Marital Status: Married    Spouse Name: N/A  . Number of Children: N/A  . Years of Education: N/A   Occupational History  . Not on file.   Social History Main Topics  . Smoking status: Never Smoker   . Smokeless tobacco: Never Used  . Alcohol Use: No  . Drug Use: No  . Sexual Activity: Not on file   Other Topics Concern  . Not on file   Social History Narrative     Review of Systems: General: negative for chills, fever, night sweats or weight changes.  Cardiovascular: negative for chest pain, dyspnea on exertion, edema, orthopnea, palpitations, paroxysmal nocturnal dyspnea or shortness of breath Dermatological: negative for rash Respiratory: negative for cough or wheezing Urologic: negative for hematuria Abdominal: negative for nausea, vomiting, diarrhea, bright red blood per rectum, melena, or hematemesis Neurologic: negative for visual changes, syncope, or dizziness All other systems reviewed and are otherwise negative except as noted  above.    Blood pressure 128/70, pulse 63, height 5\' 1"  (1.549 m), weight 148 lb 1 oz (67.161 kg).  General appearance: alert, cooperative and no distress Neck: no carotid bruit and no JVD Lungs: clear to auscultation bilaterally Heart: regular rate and rhythm Extremities: extremities normal, atraumatic, no cyanosis or edema Skin: Skin color, texture, turgor normal. No rashes or lesions Neurologic: Grossly normal  EKG NSR, NSST changes  ASSESSMENT AND PLAN:   History of NSTEMI (non-ST elevated myocardial infarction) ([place]) 12/20/14  CAD S/P LAD  DES 123XX123 Complicated PCI (VF-shocked). Recent admission for chest pain felt to be non cardiac  Essential hypertension Controlled  Hyperlipidemia with target LDL less than 70 Previously statin intolerant, now on Pravachol 80 mg  VF (ventricular fibrillation) during cath on 12/20/2014 from occluded mid LAD, s/p defib No recurrance    PLAN  Lipids CMET in 3 months, f/u with Dr Leda Gauze. OK to drive (discussed with Dr Claiborne Billings).   Williom Cedar K PA-C 01/10/2015 1:22 PM

## 2015-01-10 NOTE — Assessment & Plan Note (Signed)
12/20/14 

## 2015-01-10 NOTE — Assessment & Plan Note (Signed)
Controlled.  

## 2015-01-10 NOTE — Assessment & Plan Note (Signed)
Previously statin intolerant, now on Pravachol 80 mg

## 2015-01-10 NOTE — Assessment & Plan Note (Signed)
Complicated PCI (VF-shocked). Recent admission for chest pain felt to be non cardiac

## 2015-01-10 NOTE — Assessment & Plan Note (Signed)
No recurrance

## 2015-01-30 ENCOUNTER — Other Ambulatory Visit: Payer: Self-pay | Admitting: Cardiovascular Disease

## 2015-01-30 MED ORDER — PRAVASTATIN SODIUM 80 MG PO TABS: 80.0000 mg | ORAL_TABLET | Freq: Every day | ORAL | Status: DC

## 2015-01-30 MED ORDER — AMLODIPINE BESYLATE 10 MG PO TABS: 10.0000 mg | ORAL_TABLET | Freq: Every day | ORAL | Status: DC

## 2015-01-30 MED ORDER — TICAGRELOR 90 MG PO TABS: 90.0000 mg | ORAL_TABLET | Freq: Two times a day (BID) | ORAL | Status: DC

## 2015-01-30 MED ORDER — LISINOPRIL 10 MG PO TABS: 10.0000 mg | ORAL_TABLET | Freq: Every day | ORAL | Status: DC

## 2015-01-30 MED ORDER — NITROGLYCERIN 0.4 MG SL SUBL: 0.4000 mg | SUBLINGUAL_TABLET | SUBLINGUAL | Status: DC | PRN

## 2015-01-30 MED ORDER — METOPROLOL TARTRATE 25 MG PO TABS: 37.5000 mg | ORAL_TABLET | Freq: Two times a day (BID) | ORAL | Status: DC

## 2015-02-02 DIAGNOSIS — Z0100 Encounter for examination of eyes and vision without abnormal findings: Secondary | ICD-10-CM | POA: Diagnosis not present

## 2015-02-05 ENCOUNTER — Other Ambulatory Visit: Payer: Self-pay

## 2015-02-05 MED ORDER — LISINOPRIL 10 MG PO TABS: 10.0000 mg | ORAL_TABLET | Freq: Every day | ORAL | Status: DC

## 2015-02-05 MED ORDER — METOPROLOL TARTRATE 25 MG PO TABS: 37.5000 mg | ORAL_TABLET | Freq: Two times a day (BID) | ORAL | Status: DC

## 2015-02-05 MED ORDER — AMLODIPINE BESYLATE 10 MG PO TABS: 10.0000 mg | ORAL_TABLET | Freq: Every day | ORAL | Status: DC

## 2015-02-05 MED ORDER — NITROGLYCERIN 0.4 MG SL SUBL: 0.4000 mg | SUBLINGUAL_TABLET | SUBLINGUAL | Status: DC | PRN

## 2015-02-05 MED ORDER — TICAGRELOR 90 MG PO TABS: 90.0000 mg | ORAL_TABLET | Freq: Two times a day (BID) | ORAL | Status: DC

## 2015-02-05 MED ORDER — PRAVASTATIN SODIUM 80 MG PO TABS: 80.0000 mg | ORAL_TABLET | Freq: Every day | ORAL | Status: DC

## 2015-02-05 NOTE — Telephone Encounter (Signed)
Rx(s) sent to pharmacy electronically.  

## 2015-02-15 ENCOUNTER — Telehealth: Payer: Self-pay | Admitting: *Deleted

## 2015-02-15 NOTE — Telephone Encounter (Signed)
Faxed phase 2 cardiac rehab order to [place].

## 2015-02-25 DIAGNOSIS — E039 Hypothyroidism, unspecified: Secondary | ICD-10-CM | POA: Diagnosis not present

## 2015-02-25 DIAGNOSIS — I10 Essential (primary) hypertension: Secondary | ICD-10-CM | POA: Diagnosis not present

## 2015-02-25 DIAGNOSIS — I2510 Atherosclerotic heart disease of native coronary artery without angina pectoris: Secondary | ICD-10-CM | POA: Diagnosis not present

## 2015-02-25 DIAGNOSIS — E782 Mixed hyperlipidemia: Secondary | ICD-10-CM | POA: Diagnosis not present

## 2015-03-04 ENCOUNTER — Telehealth: Payer: Self-pay | Admitting: Cardiovascular Disease

## 2015-03-04 NOTE — Telephone Encounter (Signed)
Recommendations relayed to patient, who communicated understanding.

## 2015-03-04 NOTE — Telephone Encounter (Signed)
Would suggest decreasing amlodipine to 5 mg daily.  Watch BP and see if this resolves swelling.  Monitor sodium intake as well

## 2015-03-04 NOTE — Telephone Encounter (Signed)
New message   Patient calling wants to know what she can take after MI  - joints pains.     Pt c/o swelling: STAT is pt has developed SOB within 24 hours  1. How long have you been experiencing swelling? About week    2. Where is the swelling located? Both ankles   3.  Are you currently taking a "fluid pill"? No   4.  Are you currently SOB? No   5.  Have you traveled recently? No

## 2015-03-04 NOTE — Telephone Encounter (Signed)
Returned call. Pt having joint pain in wrist. She is going to see specialist. Gave instructions for OTC pain relief meds in interim.  2nd issue pt has is swelling in ankles. She has noted for a while. Not SOB & resolves w/ leg elevation. She is on amlodipine - has been on this med for a while.  Notes that her lisinopril used to be lisinopril-HCTZ - this was changed during recent hospitalization.  Pt aware I will defer for recommendations.

## 2015-03-07 ENCOUNTER — Encounter (HOSPITAL_COMMUNITY)
Admission: RE | Admit: 2015-03-07 | Discharge: 2015-03-07 | Disposition: A | Discharge status: Home / Self Care | Payer: Commercial Managed Care - HMO | Source: Ambulatory Visit | Attending: Cardiovascular Disease | Admitting: Cardiovascular Disease

## 2015-03-07 DIAGNOSIS — I213 ST elevation (STEMI) myocardial infarction of unspecified site: Secondary | ICD-10-CM | POA: Insufficient documentation

## 2015-03-07 DIAGNOSIS — Z955 Presence of coronary angioplasty implant and graft: Secondary | ICD-10-CM | POA: Insufficient documentation

## 2015-03-07 NOTE — Progress Notes (Signed)
Cardiac Rehab Medication Review by a Pharmacist  Does the patient  feel that his/her medications are working for him/her?  yes  Has the patient been experiencing any side effects to the medications prescribed?  no  Does the patient measure his/her own blood pressure or blood glucose at home?  yes   Does the patient have any problems obtaining medications due to transportation or finances?   no  Understanding of regimen: good Understanding of indications: good Potential of compliance: excellent  Pharmacist comments: Pt has go understanding of her medications and is taking her event very serious. She had no additional questions for me.  Darl Pikes, PharmD Clinical Pharmacist- Resident Pager: [phone]  Darl Pikes 03/07/2015 9:21 AM

## 2015-03-11 ENCOUNTER — Encounter (HOSPITAL_COMMUNITY)
Admission: RE | Admit: 2015-03-11 | Discharge: 2015-03-11 | Disposition: A | Discharge status: Home / Self Care | Payer: Commercial Managed Care - HMO | Source: Ambulatory Visit | Attending: Cardiovascular Disease | Admitting: Cardiovascular Disease

## 2015-03-11 ENCOUNTER — Ambulatory Visit (HOSPITAL_COMMUNITY): Payer: Commercial Managed Care - HMO

## 2015-03-11 DIAGNOSIS — Z955 Presence of coronary angioplasty implant and graft: Secondary | ICD-10-CM | POA: Diagnosis not present

## 2015-03-11 DIAGNOSIS — I213 ST elevation (STEMI) myocardial infarction of unspecified site: Secondary | ICD-10-CM | POA: Diagnosis not present

## 2015-03-11 NOTE — Progress Notes (Signed)
Pt started exercise today at 11:15 Phase II cardiac rehab program.  Pt tolerated light exercise without difficulty. VSS, telemetry-SR with no noted ectopy, asymptomatic.  Medication list reconciled.  Pt verbalized compliance with medications and denies barriers to compliance. PSYCHOSOCIAL ASSESSMENT:  PHQ-0. Pt exhibits positive coping skills, hopeful outlook with supportive family. No psychosocial needs identified at this time, no psychosocial interventions necessary.    Pt enjoys spending time with her great grand kids taking them to the park.   Pt cardiac rehab  goal is  to know what to do for the future. Patients seeks knowledge with nutrition and exercise.  Pt encouraged to participate in education classes and nutrition classes  to increase ability to achieve these goals.   Pt long term cardiac rehab goal is lose weight.  Pt desires to weigh 140 pounds.  Pt presently weighs 147. Will monitor pt weight for assessment of meeting this goal. Pt oriented to exercise equipment and routine.  Understanding verbalized. Cherre Huger, BSN

## 2015-03-13 ENCOUNTER — Ambulatory Visit (HOSPITAL_COMMUNITY): Payer: Commercial Managed Care - HMO

## 2015-03-13 ENCOUNTER — Encounter (HOSPITAL_COMMUNITY)
Admission: RE | Admit: 2015-03-13 | Discharge: 2015-03-13 | Disposition: A | Discharge status: Home / Self Care | Payer: Commercial Managed Care - HMO | Source: Ambulatory Visit | Attending: Cardiovascular Disease | Admitting: Cardiovascular Disease

## 2015-03-13 DIAGNOSIS — Z955 Presence of coronary angioplasty implant and graft: Secondary | ICD-10-CM | POA: Diagnosis not present

## 2015-03-13 DIAGNOSIS — I213 ST elevation (STEMI) myocardial infarction of unspecified site: Secondary | ICD-10-CM | POA: Diagnosis not present

## 2015-03-15 ENCOUNTER — Encounter (HOSPITAL_COMMUNITY)
Admission: RE | Admit: 2015-03-15 | Discharge: 2015-03-15 | Disposition: A | Discharge status: Home / Self Care | Payer: Commercial Managed Care - HMO | Source: Ambulatory Visit | Attending: Cardiovascular Disease | Admitting: Cardiovascular Disease

## 2015-03-15 ENCOUNTER — Ambulatory Visit (HOSPITAL_COMMUNITY): Payer: Commercial Managed Care - HMO

## 2015-03-15 DIAGNOSIS — I213 ST elevation (STEMI) myocardial infarction of unspecified site: Secondary | ICD-10-CM | POA: Diagnosis not present

## 2015-03-15 DIAGNOSIS — Z955 Presence of coronary angioplasty implant and graft: Secondary | ICD-10-CM | POA: Diagnosis not present

## 2015-03-18 ENCOUNTER — Ambulatory Visit (HOSPITAL_COMMUNITY): Payer: Commercial Managed Care - HMO

## 2015-03-18 ENCOUNTER — Encounter (HOSPITAL_COMMUNITY)
Admission: RE | Admit: 2015-03-18 | Discharge: 2015-03-18 | Disposition: A | Discharge status: Home / Self Care | Payer: Commercial Managed Care - HMO | Source: Ambulatory Visit | Attending: Cardiovascular Disease | Admitting: Cardiovascular Disease

## 2015-03-18 DIAGNOSIS — Z955 Presence of coronary angioplasty implant and graft: Secondary | ICD-10-CM | POA: Diagnosis not present

## 2015-03-18 DIAGNOSIS — I213 ST elevation (STEMI) myocardial infarction of unspecified site: Secondary | ICD-10-CM | POA: Diagnosis not present

## 2015-03-20 ENCOUNTER — Encounter (HOSPITAL_COMMUNITY)
Admission: RE | Admit: 2015-03-20 | Discharge: 2015-03-20 | Disposition: A | Discharge status: Home / Self Care | Payer: Commercial Managed Care - HMO | Source: Ambulatory Visit | Attending: Cardiovascular Disease | Admitting: Cardiovascular Disease

## 2015-03-20 ENCOUNTER — Ambulatory Visit (HOSPITAL_COMMUNITY): Payer: Commercial Managed Care - HMO

## 2015-03-20 DIAGNOSIS — Z955 Presence of coronary angioplasty implant and graft: Secondary | ICD-10-CM | POA: Diagnosis not present

## 2015-03-20 DIAGNOSIS — I213 ST elevation (STEMI) myocardial infarction of unspecified site: Secondary | ICD-10-CM | POA: Diagnosis not present

## 2015-03-20 NOTE — Progress Notes (Signed)
QUALITY OF LIFE SCORE REVIEW  Pt completed Quality of Life survey as a participant in Cardiac Rehab. Scores 21.0 or below are considered low. Pt scored the following Overall 28.22, Health and Function 27.60, socioeconomic 28.21, physiological and spiritual 28.29, family 30.00. Patient scored well above the low threshold.  Pt demonstrates positive and healthy outlook on life.  Pt with positive and healthy coping skills.  Will continue to monitor and intervene as necessary.  Cherre Huger, BSN

## 2015-03-22 ENCOUNTER — Ambulatory Visit (HOSPITAL_COMMUNITY): Payer: Commercial Managed Care - HMO

## 2015-03-22 ENCOUNTER — Encounter (HOSPITAL_COMMUNITY)
Admission: RE | Admit: 2015-03-22 | Discharge: 2015-03-22 | Disposition: A | Discharge status: Home / Self Care | Payer: Commercial Managed Care - HMO | Source: Ambulatory Visit | Attending: Cardiovascular Disease | Admitting: Cardiovascular Disease

## 2015-03-22 DIAGNOSIS — Z955 Presence of coronary angioplasty implant and graft: Secondary | ICD-10-CM | POA: Diagnosis not present

## 2015-03-22 DIAGNOSIS — I213 ST elevation (STEMI) myocardial infarction of unspecified site: Secondary | ICD-10-CM | POA: Diagnosis not present

## 2015-03-22 NOTE — Progress Notes (Signed)
Reviewed home exercise with pt today.  Pt plans to walk and use bike at home for exercise.  Reviewed THR, pulse, RPE, sign and symptoms, NTG use, and when to call 911 or MD.  Pt voiced understanding. Alberteen Sam, MA, ACSM RCEP

## 2015-03-25 ENCOUNTER — Encounter (HOSPITAL_COMMUNITY)
Admission: RE | Admit: 2015-03-25 | Discharge: 2015-03-25 | Disposition: A | Discharge status: Home / Self Care | Payer: Commercial Managed Care - HMO | Source: Ambulatory Visit | Attending: Cardiovascular Disease | Admitting: Cardiovascular Disease

## 2015-03-25 ENCOUNTER — Ambulatory Visit (HOSPITAL_COMMUNITY): Payer: Commercial Managed Care - HMO

## 2015-03-25 DIAGNOSIS — Z955 Presence of coronary angioplasty implant and graft: Secondary | ICD-10-CM | POA: Diagnosis not present

## 2015-03-25 DIAGNOSIS — I213 ST elevation (STEMI) myocardial infarction of unspecified site: Secondary | ICD-10-CM | POA: Diagnosis not present

## 2015-03-25 NOTE — Progress Notes (Signed)
Cathy Reilly 75 y.o. female Nutrition Note Spoke with pt.  Nutrition Survey reviewed with pt. Pt is following Step 2 of the Therapeutic Lifestyle Changes diet. Pt is pre-diabetic according to her most recent A1c. Pt was unaware of pre-diabetes. Per discussion, pt's sister is DM. Pre-diabetes discussed. Pt encouraged to talk with her PCP re: pre-diabetes. Pt expressed understanding of the information reviewed. Pt aware of nutrition education classes offered classes. Lab Results  Component Value Date   HGBA1C 6.0* 12/20/2014   Wt Readings from Last 3 Encounters:  03/07/15 148 lb 13 oz (67.5 kg)  01/10/15 148 lb 1 oz (67.161 kg)  01/04/15 146 lb (66.225 kg)   Nutrition Diagnosis ? Food-and nutrition-related knowledge deficit related to lack of exposure to information as related to diagnosis of: ? CVD ? Pre-DM ? Overweight related to excessive energy intake as evidenced by a BMI of 29.1  Nutrition Intervention ? Benefits of adopting Therapeutic Lifestyle Changes discussed when Medficts reviewed. ? Pt to attend the Portion Distortion class ? Pt to attend the  ? Nutrition I class                     ? Nutrition II class ? Pt given handouts for: ? Nutrition I class ? Nutrition II class ? Continue client-centered nutrition education by RD, as part of interdisciplinary care.  Goal(s) ? Pt to describe the benefit of including fruits, vegetables, whole grains, and low-fat dairy products in a heart healthy meal plan.  Monitor and Evaluate progress toward nutrition goal with team.  Derek Mound, M.Ed, RD, LDN, CDE 03/25/2015 12:03 PM

## 2015-03-27 ENCOUNTER — Ambulatory Visit (HOSPITAL_COMMUNITY): Payer: Commercial Managed Care - HMO

## 2015-03-27 ENCOUNTER — Encounter (HOSPITAL_COMMUNITY)
Admission: RE | Admit: 2015-03-27 | Discharge: 2015-03-27 | Disposition: A | Discharge status: Home / Self Care | Payer: Commercial Managed Care - HMO | Source: Ambulatory Visit | Attending: Cardiovascular Disease | Admitting: Cardiovascular Disease

## 2015-03-27 DIAGNOSIS — Z955 Presence of coronary angioplasty implant and graft: Secondary | ICD-10-CM | POA: Diagnosis not present

## 2015-03-27 DIAGNOSIS — I213 ST elevation (STEMI) myocardial infarction of unspecified site: Secondary | ICD-10-CM | POA: Diagnosis not present

## 2015-03-29 ENCOUNTER — Encounter (HOSPITAL_COMMUNITY)
Admission: RE | Admit: 2015-03-29 | Discharge: 2015-03-29 | Disposition: A | Discharge status: Home / Self Care | Payer: Commercial Managed Care - HMO | Source: Ambulatory Visit | Attending: Cardiovascular Disease | Admitting: Cardiovascular Disease

## 2015-03-29 ENCOUNTER — Ambulatory Visit (HOSPITAL_COMMUNITY): Payer: Commercial Managed Care - HMO

## 2015-03-29 DIAGNOSIS — I213 ST elevation (STEMI) myocardial infarction of unspecified site: Secondary | ICD-10-CM | POA: Diagnosis not present

## 2015-03-29 DIAGNOSIS — Z955 Presence of coronary angioplasty implant and graft: Secondary | ICD-10-CM | POA: Diagnosis not present

## 2015-04-01 ENCOUNTER — Ambulatory Visit (HOSPITAL_COMMUNITY): Payer: Commercial Managed Care - HMO

## 2015-04-01 ENCOUNTER — Encounter (HOSPITAL_COMMUNITY)
Admission: RE | Admit: 2015-04-01 | Discharge: 2015-04-01 | Disposition: A | Discharge status: Home / Self Care | Payer: Commercial Managed Care - HMO | Source: Ambulatory Visit | Attending: Cardiovascular Disease | Admitting: Cardiovascular Disease

## 2015-04-01 DIAGNOSIS — Z955 Presence of coronary angioplasty implant and graft: Secondary | ICD-10-CM | POA: Diagnosis not present

## 2015-04-01 DIAGNOSIS — I213 ST elevation (STEMI) myocardial infarction of unspecified site: Secondary | ICD-10-CM | POA: Diagnosis not present

## 2015-04-03 ENCOUNTER — Ambulatory Visit (HOSPITAL_COMMUNITY): Payer: Commercial Managed Care - HMO

## 2015-04-03 ENCOUNTER — Encounter (HOSPITAL_COMMUNITY)
Admission: RE | Admit: 2015-04-03 | Discharge: 2015-04-03 | Disposition: A | Discharge status: Home / Self Care | Payer: Commercial Managed Care - HMO | Source: Ambulatory Visit | Attending: Cardiovascular Disease | Admitting: Cardiovascular Disease

## 2015-04-03 DIAGNOSIS — Z955 Presence of coronary angioplasty implant and graft: Secondary | ICD-10-CM | POA: Diagnosis not present

## 2015-04-03 DIAGNOSIS — I213 ST elevation (STEMI) myocardial infarction of unspecified site: Secondary | ICD-10-CM | POA: Insufficient documentation

## 2015-04-05 ENCOUNTER — Encounter (HOSPITAL_COMMUNITY)
Admission: RE | Admit: 2015-04-05 | Discharge: 2015-04-05 | Disposition: A | Discharge status: Home / Self Care | Payer: Commercial Managed Care - HMO | Source: Ambulatory Visit | Attending: Cardiovascular Disease | Admitting: Cardiovascular Disease

## 2015-04-05 ENCOUNTER — Ambulatory Visit (HOSPITAL_COMMUNITY): Payer: Commercial Managed Care - HMO

## 2015-04-05 DIAGNOSIS — I213 ST elevation (STEMI) myocardial infarction of unspecified site: Secondary | ICD-10-CM | POA: Diagnosis not present

## 2015-04-05 DIAGNOSIS — Z955 Presence of coronary angioplasty implant and graft: Secondary | ICD-10-CM | POA: Diagnosis not present

## 2015-04-08 ENCOUNTER — Encounter (HOSPITAL_COMMUNITY)
Admission: RE | Admit: 2015-04-08 | Discharge: 2015-04-08 | Disposition: A | Discharge status: Home / Self Care | Payer: Commercial Managed Care - HMO | Source: Ambulatory Visit | Attending: Cardiovascular Disease | Admitting: Cardiovascular Disease

## 2015-04-08 ENCOUNTER — Ambulatory Visit (HOSPITAL_COMMUNITY): Payer: Commercial Managed Care - HMO

## 2015-04-08 DIAGNOSIS — Z955 Presence of coronary angioplasty implant and graft: Secondary | ICD-10-CM | POA: Diagnosis not present

## 2015-04-08 DIAGNOSIS — I213 ST elevation (STEMI) myocardial infarction of unspecified site: Secondary | ICD-10-CM | POA: Diagnosis not present

## 2015-04-10 ENCOUNTER — Ambulatory Visit (HOSPITAL_COMMUNITY): Payer: Commercial Managed Care - HMO

## 2015-04-10 ENCOUNTER — Encounter: Payer: Self-pay | Admitting: Cardiovascular Disease

## 2015-04-10 ENCOUNTER — Encounter (HOSPITAL_COMMUNITY)
Admission: RE | Admit: 2015-04-10 | Discharge: 2015-04-10 | Disposition: A | Discharge status: Home / Self Care | Payer: Commercial Managed Care - HMO | Source: Ambulatory Visit | Attending: Cardiovascular Disease | Admitting: Cardiovascular Disease

## 2015-04-10 DIAGNOSIS — Z955 Presence of coronary angioplasty implant and graft: Secondary | ICD-10-CM | POA: Diagnosis not present

## 2015-04-10 DIAGNOSIS — I213 ST elevation (STEMI) myocardial infarction of unspecified site: Secondary | ICD-10-CM | POA: Diagnosis not present

## 2015-04-10 NOTE — Telephone Encounter (Signed)
This encounter was created in error - please disregard.

## 2015-04-10 NOTE — Progress Notes (Signed)
Cathy Reilly reported that she forgot to take her Brillinta yesterday evening. Cathy Reilly took her prescribed  morning medications prior  To coming to exercise today. Cathy Sanes RN at [facility] office called and notified. Will continue to monitor the patient throughout  the program.

## 2015-04-12 ENCOUNTER — Encounter (HOSPITAL_COMMUNITY)
Admission: RE | Admit: 2015-04-12 | Discharge: 2015-04-12 | Disposition: A | Discharge status: Home / Self Care | Payer: Commercial Managed Care - HMO | Source: Ambulatory Visit | Attending: Cardiovascular Disease | Admitting: Cardiovascular Disease

## 2015-04-12 ENCOUNTER — Ambulatory Visit (HOSPITAL_COMMUNITY): Payer: Commercial Managed Care - HMO

## 2015-04-12 DIAGNOSIS — Z955 Presence of coronary angioplasty implant and graft: Secondary | ICD-10-CM | POA: Diagnosis not present

## 2015-04-12 DIAGNOSIS — I213 ST elevation (STEMI) myocardial infarction of unspecified site: Secondary | ICD-10-CM | POA: Diagnosis not present

## 2015-04-15 ENCOUNTER — Ambulatory Visit (HOSPITAL_COMMUNITY): Payer: Commercial Managed Care - HMO

## 2015-04-15 ENCOUNTER — Encounter (HOSPITAL_COMMUNITY)
Admission: RE | Admit: 2015-04-15 | Discharge: 2015-04-15 | Disposition: A | Discharge status: Home / Self Care | Payer: Commercial Managed Care - HMO | Source: Ambulatory Visit | Attending: Cardiovascular Disease | Admitting: Cardiovascular Disease

## 2015-04-15 DIAGNOSIS — Z955 Presence of coronary angioplasty implant and graft: Secondary | ICD-10-CM | POA: Diagnosis not present

## 2015-04-15 DIAGNOSIS — I213 ST elevation (STEMI) myocardial infarction of unspecified site: Secondary | ICD-10-CM | POA: Diagnosis not present

## 2015-04-17 ENCOUNTER — Ambulatory Visit (HOSPITAL_COMMUNITY): Payer: Commercial Managed Care - HMO

## 2015-04-17 ENCOUNTER — Encounter (HOSPITAL_COMMUNITY)
Admission: RE | Admit: 2015-04-17 | Discharge: 2015-04-17 | Disposition: A | Discharge status: Home / Self Care | Payer: Commercial Managed Care - HMO | Source: Ambulatory Visit | Attending: Cardiovascular Disease | Admitting: Cardiovascular Disease

## 2015-04-17 DIAGNOSIS — Z955 Presence of coronary angioplasty implant and graft: Secondary | ICD-10-CM | POA: Diagnosis not present

## 2015-04-17 DIAGNOSIS — I213 ST elevation (STEMI) myocardial infarction of unspecified site: Secondary | ICD-10-CM | POA: Diagnosis not present

## 2015-04-18 ENCOUNTER — Ambulatory Visit (INDEPENDENT_AMBULATORY_CARE_PROVIDER_SITE_OTHER): Payer: Commercial Managed Care - HMO | Admitting: Cardiovascular Disease

## 2015-04-18 ENCOUNTER — Encounter: Payer: Self-pay | Admitting: Cardiovascular Disease

## 2015-04-18 VITALS — BP 134/78 | HR 58 | Ht 60.0 in | Wt 151.0 lb

## 2015-04-18 DIAGNOSIS — E0590 Thyrotoxicosis, unspecified without thyrotoxic crisis or storm: Secondary | ICD-10-CM

## 2015-04-18 DIAGNOSIS — E785 Hyperlipidemia, unspecified: Secondary | ICD-10-CM

## 2015-04-18 DIAGNOSIS — I10 Essential (primary) hypertension: Secondary | ICD-10-CM | POA: Diagnosis not present

## 2015-04-18 DIAGNOSIS — I25118 Atherosclerotic heart disease of native coronary artery with other forms of angina pectoris: Secondary | ICD-10-CM

## 2015-04-18 DIAGNOSIS — I214 Non-ST elevation (NSTEMI) myocardial infarction: Secondary | ICD-10-CM

## 2015-04-18 DIAGNOSIS — I2510 Atherosclerotic heart disease of native coronary artery without angina pectoris: Secondary | ICD-10-CM

## 2015-04-18 DIAGNOSIS — E039 Hypothyroidism, unspecified: Secondary | ICD-10-CM

## 2015-04-18 NOTE — Patient Instructions (Signed)
Your physician recommends that you return for lab work fasting.   Your physician wants you to follow-up in: 6 months or sooner if needed. You will receive a reminder letter in the mail two months in advance. If you don't receive a letter, please call our office to schedule the follow-up appointment.  If you need a refill on your cardiac medications before your next appointment, please call your pharmacy.   

## 2015-04-18 NOTE — Progress Notes (Signed)
Patient ID: Cathy Reilly, female   DOB: [birth year], 75 y.o.   MRN: [id number]     HPI: Cathy Reilly is a 75 y.o. female who presents to the office today for a follow up cardiology evaluation.  I had not seen her since her November 2016 hospitalization when she presented with a NSTENMI.  Ms. Luebbert has a history of hypertension and hypothyroidism.  She was admitted in the early morning on 12/19/2014 with a symptom complex suggestive of unstable angina.  She ruled in for non-ST segment elevation MI with initial troponin of 5.  She was referred for cardiac catheterization.  In the catheterization laboratory she developed an episode of VT/VF and required defibrillation.  She underwent successful intervention to a subtotally/totally occluded LAD which had diffuse proximal to mid disease and was successfully stented with a 3033 mm Xience Alpine stent.  She was also felt to have a possible spontaneous dissection with a 90% distal intermediate stenosis ulcerated plaque.  She was maintained on anticoagulation and several days later was brought back to the catheterization laboratory.  Her's LAD stent was widely patent.  There was resolution of her prior intermediate stenosis.  Subsequent, she has remained stable.  She is participating in phase II cardiac rehabilitation.  She denies recurrent anginal symptoms.  She admits to being active.  She is unaware of any palpitations.  Past Medical History  Diagnosis Date  . Hypertension   . Thyroid disease   . Osteopenia     hip  . VF (ventricular fibrillation) during cath on 12/20/2014 from occluded mid LAD, s/p defib   . CAD (coronary artery disease), native coronary artery s/p DES to LAD 12/20/2014 01/03/2015  . Prediabetes Hgb A1C 6.0 01/03/2015  . NSTEMI (non-ST elevated myocardial infarction) ([place]) 12/2014  . Bronchitis 12/2014  . Hypothyroidism   . Anxiety     Past Surgical History  Procedure Laterality Date  . Cholecystectomy    . Appendectomy    .  Tonsillectomy    . Cardiac catheterization N/A 12/20/2014    Procedure: Left Heart Cath and Coronary Angiography;  Surgeon: Troy Sine, MD;  Location: [facility] [facility];  Service: Cardiovascular;  Laterality: N/A;  . Cardiac catheterization N/A 12/20/2014    Procedure: Coronary Stent Intervention;  Surgeon: Troy Sine, MD;  Location: [place] [facility];  Service: Cardiovascular;  Laterality: N/A;  . Cardiac catheterization N/A 12/24/2014    Procedure: Left Heart Cath and Coronary Angiography;  Surgeon: Jettie Booze, MD;  Location: [place] [facility];  Service: Cardiovascular;  Laterality: N/A;  . Coronary angioplasty with stent placement      Allergies  Allergen Reactions  . Codeine Nausea And Vomiting  . Crestor [Rosuvastatin] Other (See Comments)    myaglia  . Lipitor [Atorvastatin] Other (See Comments)    myaglia  . Septra [Sulfamethoxazole-Trimethoprim] Nausea And Vomiting  . Simvastatin Other (See Comments)    myaglia  . Welchol [Colesevelam Hcl] Other (See Comments)    myaglia  . Prednisone Palpitations    Current Outpatient Prescriptions  Medication Sig Dispense Refill  . amLODipine (NORVASC) 10 MG tablet Take 1 tablet (10 mg total) by mouth daily. (Patient taking differently: Take 5 mg by mouth daily. ) 90 tablet 3  . aspirin 81 MG tablet Take 81 mg by mouth daily.    Marland Kitchen co-enzyme Q-10 30 MG capsule Take 100 mg by mouth daily.    Marland Kitchen levothyroxine (SYNTHROID, LEVOTHROID) 88 MCG tablet Take 88 mcg  by mouth daily before breakfast.    . lisinopril (PRINIVIL,ZESTRIL) 10 MG tablet Take 1 tablet (10 mg total) by mouth daily. 90 tablet 3  . metoprolol tartrate (LOPRESSOR) 25 MG tablet Take 1.5 tablets (37.5 mg total) by mouth 2 (two) times daily. 270 tablet 3  . nitroGLYCERIN (NITROSTAT) 0.4 MG SL tablet Place 1 tablet (0.4 mg total) under the tongue every 5 (five) minutes x 3 doses as needed for chest pain. 100 tablet 0  . pravastatin (PRAVACHOL) 80 MG tablet Take 1  tablet (80 mg total) by mouth daily at 6 PM. 90 tablet 3  . ticagrelor (BRILINTA) 90 MG TABS tablet Take 1 tablet (90 mg total) by mouth 2 (two) times daily. 180 tablet 3   No current facility-administered medications for this visit.    Social History   Social History  . Marital Status: Married    Spouse Name: N/A  . Number of Children: N/A  . Years of Education: N/A   Occupational History  . Not on file.   Social History Main Topics  . Smoking status: Never Smoker   . Smokeless tobacco: Never Used  . Alcohol Use: No  . Drug Use: No  . Sexual Activity: Not on file   Other Topics Concern  . Not on file   Social History Narrative    Family History  Problem Relation Age of Onset  . Hypertension Mother   . Hypertension Father   . Hypertension Sister   . Cancer Sister   . Hypertension Brother   . Cancer Brother   . CVA Brother   . Transient ischemic attack Sister     ROS General: Negative; No fevers, chills, or night sweats HEENT: Negative; No changes in vision or hearing, sinus congestion, difficulty swallowing Pulmonary: Negative; No cough, wheezing, shortness of breath, hemoptysis Cardiovascular: See HPI: No chest pain, presyncope, syncope, palpatations GI: Negative; No nausea, vomiting, diarrhea, or abdominal pain GU: Negative; No dysuria, hematuria, or difficulty voiding Musculoskeletal: Negative; no myalgias, joint pain, or weakness Hematologic: Negative; no easy bruising, bleeding Endocrine: Negative; no heat/cold intolerance; no diabetes, Neuro: Negative; no changes in balance, headaches Skin: Negative; No rashes or skin lesions Psychiatric: Negative; No behavioral problems, depression Sleep: Negative; No snoring,  daytime sleepiness, hypersomnolence, bruxism, restless legs, hypnogognic hallucinations. Other comprehensive 14 point system review is negative   Physical Exam BP 134/78 mmHg  Pulse 58  Ht 5' (1.524 m)  Wt 151 lb (68.493 kg)  BMI 29.49  kg/m2 Wt Readings from Last 3 Encounters:  04/18/15 151 lb (68.493 kg)  03/07/15 148 lb 13 oz (67.5 kg)  01/10/15 148 lb 1 oz (67.161 kg)   General: Alert, oriented, no distress.  Skin: normal turgor, no rashes, warm and dry HEENT: Normocephalic, atraumatic. Pupils equal round and reactive to light; sclera anicteric; extraocular muscles intact, No lid lag; Nose without nasal septal hypertrophy; Mouth/Parynx benign; Mallinpatti scale 2/3 Neck: No JVD, no carotid bruits; normal carotid upstroke Lungs: clear to ausculatation and percussion bilaterally; no wheezing or rales, normal inspiratory and expiratory effort Chest wall: without tenderness to palpitation Heart: PMI not displaced, RRR, s1 s2 normal, 1/6 systolic murmur, No diastolic murmur, no rubs, gallops, thrills, or heaves Abdomen: soft, nontender; no hepatosplenomehaly, BS+; abdominal aorta nontender and not dilated by palpation. Back: no CVA tenderness Pulses: 2+  Musculoskeletal: full range of motion, normal strength, no joint deformities Extremities: Pulses 2+, no clubbing cyanosis or edema, Homan's sign negative  Neurologic: grossly nonfocal; Cranial nerves grossly  wnl Psychologic: Normal mood and affect   ECG (independently read by me):  Sinus bradycardia 59 bpm.  No ectopy.  No ECG criteria for her MI.  LABS:  BMP Latest Ref Rng 01/04/2015 01/03/2015 01/03/2015  Glucose 65 - 99 mg/dL 100(H) - 105(H)  BUN 6 - 20 mg/dL 12 - 12  Creatinine 0.44 - 1.00 mg/dL 0.90 0.90 0.73  Sodium 135 - 145 mmol/L 140 - 140  Potassium 3.5 - 5.1 mmol/L 4.0 - 3.8  Chloride 101 - 111 mmol/L 104 - 107  CO2 22 - 32 mmol/L 29 - 26  Calcium 8.9 - 10.3 mg/dL 9.4 - 9.0     No flowsheet data found.  CBC Latest Ref Rng 01/03/2015 01/03/2015 12/22/2014  WBC 4.0 - 10.5 K/uL 9.9 9.9 10.8(H)  Hemoglobin 12.0 - 15.0 g/dL 13.0 12.6 11.2(L)  Hematocrit 36.0 - 46.0 % 38.7 37.3 34.0(L)  Platelets 150 - 400 K/uL 462(H) 434(H) 223   Lab Results  Component  Value Date   MCV 88.2 01/03/2015   MCV 88.8 01/03/2015   MCV 89.7 12/22/2014    No results found for: TSH  BNP    Component Value Date/Time   BNP 552.5* 01/03/2015 1000    ProBNP No results found for: PROBNP   Lipid Panel     Component Value Date/Time   CHOL 175 12/21/2014 0437   TRIG 120 12/21/2014 0437   HDL 34* 12/21/2014 0437   CHOLHDL 5.1 12/21/2014 0437   VLDL 24 12/21/2014 0437   LDLCALC 117* 12/21/2014 0437     RADIOLOGY: No results found.    ASSESSMENT AND PLAN:  Ms. Rosalea Poff is a 75 year old female who suffered a non-ST segment elevation MI in November 2016 and underwent successful stenting of a subtotally occluded LAD extending proximally into the mid vessel. She  Developed VT/VF requiring defibrillation in the laboratory.  At the time she was also felt to have a possible ulcerated plaque in the distal ramus immediate vessel, which ultimately cleared with anticoagulation therapy.  Clinically, she has been angina free.  She is participating in cardiac rehabilitation.  Her blood pressure today is 134/78 on her medical regimen consisting of metoprolol 37.5 mg twice a day, lisinopril 10 mg daily, and amlodipine 10 mg.  She is on aspirin and Brilinta for antiplatelet therapy.  She also is on pravastatin 80 mg for hyperlipidemia.  She has a history of hypothyroidism and is on Synthroid 88 g.  I have recommended follow-up laboratory be obtained.  If she is not at target with reference to lipid studies.  She will be changed from pravastatin to additional statin therapy.  I recommended that she continue with cardiac rehabilitation.  I will see her in 6 months for reevaluation.  Tme spent: 25 minutes  Troy Sine, MD, [facility]  04/18/2015 4:16 PM

## 2015-04-19 ENCOUNTER — Encounter (HOSPITAL_COMMUNITY)
Admission: RE | Admit: 2015-04-19 | Discharge: 2015-04-19 | Disposition: A | Discharge status: Home / Self Care | Payer: Commercial Managed Care - HMO | Source: Ambulatory Visit | Attending: Cardiovascular Disease | Admitting: Cardiovascular Disease

## 2015-04-19 ENCOUNTER — Ambulatory Visit (HOSPITAL_COMMUNITY): Payer: Commercial Managed Care - HMO

## 2015-04-19 DIAGNOSIS — I213 ST elevation (STEMI) myocardial infarction of unspecified site: Secondary | ICD-10-CM | POA: Diagnosis not present

## 2015-04-19 DIAGNOSIS — Z955 Presence of coronary angioplasty implant and graft: Secondary | ICD-10-CM | POA: Diagnosis not present

## 2015-04-22 ENCOUNTER — Ambulatory Visit (HOSPITAL_COMMUNITY): Payer: Commercial Managed Care - HMO

## 2015-04-22 ENCOUNTER — Encounter (HOSPITAL_COMMUNITY): Payer: Commercial Managed Care - HMO

## 2015-04-24 ENCOUNTER — Encounter (HOSPITAL_COMMUNITY)
Admission: RE | Admit: 2015-04-24 | Discharge: 2015-04-24 | Disposition: A | Discharge status: Home / Self Care | Payer: Commercial Managed Care - HMO | Source: Ambulatory Visit | Attending: Cardiovascular Disease | Admitting: Cardiovascular Disease

## 2015-04-24 ENCOUNTER — Ambulatory Visit (HOSPITAL_COMMUNITY): Payer: Commercial Managed Care - HMO

## 2015-04-24 DIAGNOSIS — I213 ST elevation (STEMI) myocardial infarction of unspecified site: Secondary | ICD-10-CM | POA: Diagnosis not present

## 2015-04-24 DIAGNOSIS — Z955 Presence of coronary angioplasty implant and graft: Secondary | ICD-10-CM | POA: Diagnosis not present

## 2015-04-26 ENCOUNTER — Encounter (HOSPITAL_COMMUNITY)
Admission: RE | Admit: 2015-04-26 | Discharge: 2015-04-26 | Disposition: A | Discharge status: Home / Self Care | Payer: Commercial Managed Care - HMO | Source: Ambulatory Visit | Attending: Cardiovascular Disease | Admitting: Cardiovascular Disease

## 2015-04-26 ENCOUNTER — Ambulatory Visit (HOSPITAL_COMMUNITY): Payer: Commercial Managed Care - HMO

## 2015-04-26 DIAGNOSIS — E785 Hyperlipidemia, unspecified: Secondary | ICD-10-CM | POA: Diagnosis not present

## 2015-04-26 DIAGNOSIS — I213 ST elevation (STEMI) myocardial infarction of unspecified site: Secondary | ICD-10-CM | POA: Diagnosis not present

## 2015-04-26 DIAGNOSIS — I2510 Atherosclerotic heart disease of native coronary artery without angina pectoris: Secondary | ICD-10-CM | POA: Diagnosis not present

## 2015-04-26 DIAGNOSIS — E0590 Thyrotoxicosis, unspecified without thyrotoxic crisis or storm: Secondary | ICD-10-CM | POA: Diagnosis not present

## 2015-04-26 DIAGNOSIS — Z955 Presence of coronary angioplasty implant and graft: Secondary | ICD-10-CM | POA: Diagnosis not present

## 2015-04-26 DIAGNOSIS — I10 Essential (primary) hypertension: Secondary | ICD-10-CM | POA: Diagnosis not present

## 2015-04-26 LAB — CBC
HCT: 40.1 % (ref 36.0–46.0)
HEMOGLOBIN: 13.8 g/dL (ref 12.0–15.0)
MCH: 29.4 pg (ref 26.0–34.0)
MCHC: 34.4 g/dL (ref 30.0–36.0)
MCV: 85.5 fL (ref 78.0–100.0)
MPV: 10.7 fL (ref 8.6–12.4)
PLATELETS: 291 10*3/uL (ref 150–400)
RBC: 4.69 MIL/uL (ref 3.87–5.11)
RDW: 12.8 % (ref 11.5–15.5)
WBC: 6.2 10*3/uL (ref 4.0–10.5)

## 2015-04-26 LAB — TSH: TSH: 1.84 m[IU]/L

## 2015-04-29 ENCOUNTER — Ambulatory Visit (HOSPITAL_COMMUNITY): Payer: Commercial Managed Care - HMO

## 2015-04-29 ENCOUNTER — Encounter (HOSPITAL_COMMUNITY)
Admission: RE | Admit: 2015-04-29 | Discharge: 2015-04-29 | Disposition: A | Discharge status: Home / Self Care | Payer: Commercial Managed Care - HMO | Source: Ambulatory Visit | Attending: Cardiovascular Disease | Admitting: Cardiovascular Disease

## 2015-04-29 DIAGNOSIS — I213 ST elevation (STEMI) myocardial infarction of unspecified site: Secondary | ICD-10-CM | POA: Diagnosis not present

## 2015-04-29 DIAGNOSIS — Z955 Presence of coronary angioplasty implant and graft: Secondary | ICD-10-CM | POA: Diagnosis not present

## 2015-05-01 ENCOUNTER — Ambulatory Visit (HOSPITAL_COMMUNITY): Payer: Commercial Managed Care - HMO

## 2015-05-01 ENCOUNTER — Encounter (HOSPITAL_COMMUNITY)
Admission: RE | Admit: 2015-05-01 | Discharge: 2015-05-01 | Disposition: A | Discharge status: Home / Self Care | Payer: Commercial Managed Care - HMO | Source: Ambulatory Visit | Attending: Cardiovascular Disease | Admitting: Cardiovascular Disease

## 2015-05-01 DIAGNOSIS — Z955 Presence of coronary angioplasty implant and graft: Secondary | ICD-10-CM | POA: Diagnosis not present

## 2015-05-01 DIAGNOSIS — I213 ST elevation (STEMI) myocardial infarction of unspecified site: Secondary | ICD-10-CM | POA: Diagnosis not present

## 2015-05-01 LAB — LIPID PANEL
Cholesterol: 165 mg/dL (ref 125–200)
HDL: 46 mg/dL (ref >=46)
LDL CALC: 98 mg/dL (ref <130)
Total CHOL/HDL Ratio: 3.6 Ratio (ref <=5.0)
Triglycerides: 103 mg/dL (ref <150)
VLDL: 21 mg/dL (ref <30)

## 2015-05-01 LAB — COMPREHENSIVE METABOLIC PANEL
ALBUMIN: 4.1 g/dL (ref 3.6–5.1)
ALT: 13 U/L (ref 6–29)
AST: 18 U/L (ref 10–35)
Alkaline Phosphatase: 75 U/L (ref 33–130)
BILIRUBIN TOTAL: 1 mg/dL (ref 0.2–1.2)
BUN: 12 mg/dL (ref 7–25)
CHLORIDE: 102 mmol/L (ref 98–110)
CO2: 29 mmol/L (ref 20–31)
CREATININE: 0.71 mg/dL (ref 0.60–0.93)
Calcium: 9.9 mg/dL (ref 8.6–10.4)
GLUCOSE: 94 mg/dL (ref 65–99)
Potassium: 4.3 mmol/L (ref 3.5–5.3)
SODIUM: 140 mmol/L (ref 135–146)
Total Protein: 7.3 g/dL (ref 6.1–8.1)

## 2015-05-03 ENCOUNTER — Encounter (HOSPITAL_COMMUNITY)
Admission: RE | Admit: 2015-05-03 | Discharge: 2015-05-03 | Disposition: A | Discharge status: Home / Self Care | Payer: Commercial Managed Care - HMO | Source: Ambulatory Visit | Attending: Cardiovascular Disease | Admitting: Cardiovascular Disease

## 2015-05-03 ENCOUNTER — Ambulatory Visit (HOSPITAL_COMMUNITY): Payer: Commercial Managed Care - HMO

## 2015-05-03 DIAGNOSIS — Z955 Presence of coronary angioplasty implant and graft: Secondary | ICD-10-CM | POA: Diagnosis not present

## 2015-05-03 DIAGNOSIS — I213 ST elevation (STEMI) myocardial infarction of unspecified site: Secondary | ICD-10-CM | POA: Diagnosis not present

## 2015-05-06 ENCOUNTER — Ambulatory Visit (HOSPITAL_COMMUNITY): Payer: Commercial Managed Care - HMO

## 2015-05-06 ENCOUNTER — Telehealth (HOSPITAL_COMMUNITY): Payer: Self-pay | Admitting: Family Medicine

## 2015-05-06 ENCOUNTER — Encounter (HOSPITAL_COMMUNITY): Payer: Commercial Managed Care - HMO

## 2015-05-06 ENCOUNTER — Encounter: Payer: Self-pay | Admitting: *Deleted

## 2015-05-08 ENCOUNTER — Encounter (HOSPITAL_COMMUNITY): Admission: RE | Admit: 2015-05-08 | Payer: Commercial Managed Care - HMO | Source: Ambulatory Visit

## 2015-05-08 ENCOUNTER — Ambulatory Visit (HOSPITAL_COMMUNITY): Payer: Commercial Managed Care - HMO

## 2015-05-10 ENCOUNTER — Telehealth: Payer: Self-pay | Admitting: Cardiovascular Disease

## 2015-05-10 ENCOUNTER — Ambulatory Visit (HOSPITAL_COMMUNITY): Payer: Commercial Managed Care - HMO

## 2015-05-10 ENCOUNTER — Encounter (HOSPITAL_COMMUNITY)
Admission: RE | Admit: 2015-05-10 | Discharge: 2015-05-10 | Disposition: A | Discharge status: Home / Self Care | Payer: Commercial Managed Care - HMO | Source: Ambulatory Visit | Attending: Cardiovascular Disease | Admitting: Cardiovascular Disease

## 2015-05-10 DIAGNOSIS — I213 ST elevation (STEMI) myocardial infarction of unspecified site: Secondary | ICD-10-CM | POA: Insufficient documentation

## 2015-05-10 DIAGNOSIS — Z955 Presence of coronary angioplasty implant and graft: Secondary | ICD-10-CM | POA: Insufficient documentation

## 2015-05-10 NOTE — Progress Notes (Signed)
Incomplete Session Note  Patient Details  Name: Cathy Reilly MRN: [id number] Date of Birth: 04-09-1940 Referring Provider:  Carol Ada, MD  [name] did not complete her rehab session. Pt absent last two exercise sessions.  Pt returned today but felt dizzy due to new sinus congestion.  Pt asked what she could take over the counter. Advised pt to avoid cold medicine that had sudaphed in it.  Any other advisement, call Dr.Kelly office.  Asked if appointment could be made with primary MD.  Pt stated no.  Pt wanted to wait and see how she felt on Saturday.  Pt mentioned her primary md office has walk in clinic on Saturdays.  Pt given some gingerale and she felt better.  Pt felt ok to drive herself home and planned to take it easy for the rest of the day. Cherre Huger, BSN

## 2015-05-10 NOTE — Telephone Encounter (Signed)
New message  Pt req a call back to determine which medications she can take over the counter for sinuses given her heart condition. Please call

## 2015-05-10 NOTE — Telephone Encounter (Signed)
Recommended OTC Claritin or Zyrtec, call if further needs. Pt voiced understanding and thanks.

## 2015-05-13 ENCOUNTER — Encounter (HOSPITAL_COMMUNITY)
Admission: RE | Admit: 2015-05-13 | Discharge: 2015-05-13 | Disposition: A | Discharge status: Home / Self Care | Payer: Commercial Managed Care - HMO | Source: Ambulatory Visit | Attending: Cardiovascular Disease | Admitting: Cardiovascular Disease

## 2015-05-13 ENCOUNTER — Ambulatory Visit (HOSPITAL_COMMUNITY): Payer: Commercial Managed Care - HMO

## 2015-05-13 DIAGNOSIS — Z955 Presence of coronary angioplasty implant and graft: Secondary | ICD-10-CM | POA: Diagnosis not present

## 2015-05-13 DIAGNOSIS — I213 ST elevation (STEMI) myocardial infarction of unspecified site: Secondary | ICD-10-CM | POA: Diagnosis not present

## 2015-05-15 ENCOUNTER — Encounter (HOSPITAL_COMMUNITY)
Admission: RE | Admit: 2015-05-15 | Discharge: 2015-05-15 | Disposition: A | Discharge status: Home / Self Care | Payer: Commercial Managed Care - HMO | Source: Ambulatory Visit | Attending: Cardiovascular Disease | Admitting: Cardiovascular Disease

## 2015-05-15 ENCOUNTER — Ambulatory Visit (HOSPITAL_COMMUNITY): Payer: Commercial Managed Care - HMO

## 2015-05-15 DIAGNOSIS — I213 ST elevation (STEMI) myocardial infarction of unspecified site: Secondary | ICD-10-CM | POA: Diagnosis not present

## 2015-05-15 DIAGNOSIS — Z955 Presence of coronary angioplasty implant and graft: Secondary | ICD-10-CM | POA: Diagnosis not present

## 2015-05-17 ENCOUNTER — Encounter (HOSPITAL_COMMUNITY): Payer: Commercial Managed Care - HMO

## 2015-05-17 ENCOUNTER — Ambulatory Visit (HOSPITAL_COMMUNITY): Payer: Commercial Managed Care - HMO

## 2015-05-20 ENCOUNTER — Encounter (HOSPITAL_COMMUNITY)
Admission: RE | Admit: 2015-05-20 | Discharge: 2015-05-20 | Disposition: A | Discharge status: Home / Self Care | Payer: Commercial Managed Care - HMO | Source: Ambulatory Visit | Attending: Cardiovascular Disease | Admitting: Cardiovascular Disease

## 2015-05-20 ENCOUNTER — Ambulatory Visit (HOSPITAL_COMMUNITY): Payer: Commercial Managed Care - HMO

## 2015-05-20 DIAGNOSIS — I213 ST elevation (STEMI) myocardial infarction of unspecified site: Secondary | ICD-10-CM | POA: Diagnosis not present

## 2015-05-20 DIAGNOSIS — Z955 Presence of coronary angioplasty implant and graft: Secondary | ICD-10-CM | POA: Diagnosis not present

## 2015-05-22 ENCOUNTER — Ambulatory Visit (HOSPITAL_COMMUNITY): Payer: Commercial Managed Care - HMO

## 2015-05-22 ENCOUNTER — Encounter (HOSPITAL_COMMUNITY)
Admission: RE | Admit: 2015-05-22 | Discharge: 2015-05-22 | Disposition: A | Discharge status: Home / Self Care | Payer: Commercial Managed Care - HMO | Source: Ambulatory Visit | Attending: Cardiovascular Disease | Admitting: Cardiovascular Disease

## 2015-05-22 DIAGNOSIS — I213 ST elevation (STEMI) myocardial infarction of unspecified site: Secondary | ICD-10-CM | POA: Diagnosis not present

## 2015-05-22 DIAGNOSIS — Z955 Presence of coronary angioplasty implant and graft: Secondary | ICD-10-CM | POA: Diagnosis not present

## 2015-05-24 ENCOUNTER — Encounter (HOSPITAL_COMMUNITY)
Admission: RE | Admit: 2015-05-24 | Discharge: 2015-05-24 | Disposition: A | Discharge status: Home / Self Care | Payer: Commercial Managed Care - HMO | Source: Ambulatory Visit | Attending: Cardiovascular Disease | Admitting: Cardiovascular Disease

## 2015-05-24 ENCOUNTER — Ambulatory Visit (HOSPITAL_COMMUNITY): Payer: Commercial Managed Care - HMO

## 2015-05-24 DIAGNOSIS — Z955 Presence of coronary angioplasty implant and graft: Secondary | ICD-10-CM | POA: Diagnosis not present

## 2015-05-24 DIAGNOSIS — I213 ST elevation (STEMI) myocardial infarction of unspecified site: Secondary | ICD-10-CM | POA: Diagnosis not present

## 2015-05-27 ENCOUNTER — Ambulatory Visit (HOSPITAL_COMMUNITY): Payer: Commercial Managed Care - HMO

## 2015-05-27 ENCOUNTER — Encounter (HOSPITAL_COMMUNITY)
Admission: RE | Admit: 2015-05-27 | Discharge: 2015-05-27 | Disposition: A | Discharge status: Home / Self Care | Payer: Commercial Managed Care - HMO | Source: Ambulatory Visit | Attending: Cardiovascular Disease | Admitting: Cardiovascular Disease

## 2015-05-27 DIAGNOSIS — Z955 Presence of coronary angioplasty implant and graft: Secondary | ICD-10-CM | POA: Diagnosis not present

## 2015-05-27 DIAGNOSIS — I213 ST elevation (STEMI) myocardial infarction of unspecified site: Secondary | ICD-10-CM | POA: Diagnosis not present

## 2015-05-29 ENCOUNTER — Encounter (HOSPITAL_COMMUNITY)
Admission: RE | Admit: 2015-05-29 | Discharge: 2015-05-29 | Disposition: A | Discharge status: Home / Self Care | Payer: Commercial Managed Care - HMO | Source: Ambulatory Visit | Attending: Cardiovascular Disease | Admitting: Cardiovascular Disease

## 2015-05-29 ENCOUNTER — Ambulatory Visit (HOSPITAL_COMMUNITY): Payer: Commercial Managed Care - HMO

## 2015-05-29 DIAGNOSIS — I213 ST elevation (STEMI) myocardial infarction of unspecified site: Secondary | ICD-10-CM | POA: Diagnosis not present

## 2015-05-29 DIAGNOSIS — Z955 Presence of coronary angioplasty implant and graft: Secondary | ICD-10-CM | POA: Diagnosis not present

## 2015-05-31 ENCOUNTER — Encounter (HOSPITAL_COMMUNITY)
Admission: RE | Admit: 2015-05-31 | Discharge: 2015-05-31 | Disposition: A | Discharge status: Home / Self Care | Payer: Commercial Managed Care - HMO | Source: Ambulatory Visit | Attending: Cardiovascular Disease | Admitting: Cardiovascular Disease

## 2015-05-31 ENCOUNTER — Ambulatory Visit (HOSPITAL_COMMUNITY): Payer: Commercial Managed Care - HMO

## 2015-05-31 DIAGNOSIS — Z955 Presence of coronary angioplasty implant and graft: Secondary | ICD-10-CM | POA: Diagnosis not present

## 2015-05-31 DIAGNOSIS — I213 ST elevation (STEMI) myocardial infarction of unspecified site: Secondary | ICD-10-CM | POA: Diagnosis not present

## 2015-06-03 ENCOUNTER — Ambulatory Visit (HOSPITAL_COMMUNITY): Payer: Commercial Managed Care - HMO

## 2015-06-03 ENCOUNTER — Encounter (HOSPITAL_COMMUNITY)
Admission: RE | Admit: 2015-06-03 | Discharge: 2015-06-03 | Disposition: A | Discharge status: Home / Self Care | Payer: Commercial Managed Care - HMO | Source: Ambulatory Visit | Attending: Cardiovascular Disease | Admitting: Cardiovascular Disease

## 2015-06-03 DIAGNOSIS — I213 ST elevation (STEMI) myocardial infarction of unspecified site: Secondary | ICD-10-CM | POA: Insufficient documentation

## 2015-06-03 DIAGNOSIS — Z955 Presence of coronary angioplasty implant and graft: Secondary | ICD-10-CM | POA: Diagnosis not present

## 2015-06-05 ENCOUNTER — Encounter (HOSPITAL_COMMUNITY): Payer: Commercial Managed Care - HMO

## 2015-06-05 ENCOUNTER — Ambulatory Visit (HOSPITAL_COMMUNITY): Payer: Commercial Managed Care - HMO

## 2015-06-05 ENCOUNTER — Encounter (HOSPITAL_COMMUNITY)
Admission: RE | Admit: 2015-06-05 | Discharge: 2015-06-05 | Disposition: A | Discharge status: Home / Self Care | Payer: Commercial Managed Care - HMO | Source: Ambulatory Visit | Attending: Cardiovascular Disease | Admitting: Cardiovascular Disease

## 2015-06-05 DIAGNOSIS — Z955 Presence of coronary angioplasty implant and graft: Secondary | ICD-10-CM | POA: Diagnosis not present

## 2015-06-05 DIAGNOSIS — I213 ST elevation (STEMI) myocardial infarction of unspecified site: Secondary | ICD-10-CM | POA: Diagnosis not present

## 2015-06-07 ENCOUNTER — Ambulatory Visit (HOSPITAL_COMMUNITY): Payer: Commercial Managed Care - HMO

## 2015-06-07 ENCOUNTER — Encounter (HOSPITAL_COMMUNITY)
Admission: RE | Admit: 2015-06-07 | Discharge: 2015-06-07 | Disposition: A | Discharge status: Home / Self Care | Payer: Commercial Managed Care - HMO | Source: Ambulatory Visit | Attending: Cardiovascular Disease | Admitting: Cardiovascular Disease

## 2015-06-07 DIAGNOSIS — Z955 Presence of coronary angioplasty implant and graft: Secondary | ICD-10-CM | POA: Diagnosis not present

## 2015-06-07 DIAGNOSIS — I213 ST elevation (STEMI) myocardial infarction of unspecified site: Secondary | ICD-10-CM | POA: Diagnosis not present

## 2015-06-10 ENCOUNTER — Ambulatory Visit (HOSPITAL_COMMUNITY): Payer: Commercial Managed Care - HMO

## 2015-06-10 ENCOUNTER — Encounter (HOSPITAL_COMMUNITY)
Admission: RE | Admit: 2015-06-10 | Discharge: 2015-06-10 | Disposition: A | Discharge status: Home / Self Care | Payer: Commercial Managed Care - HMO | Source: Ambulatory Visit | Attending: Cardiovascular Disease | Admitting: Cardiovascular Disease

## 2015-06-10 DIAGNOSIS — Z955 Presence of coronary angioplasty implant and graft: Secondary | ICD-10-CM | POA: Diagnosis not present

## 2015-06-10 DIAGNOSIS — I213 ST elevation (STEMI) myocardial infarction of unspecified site: Secondary | ICD-10-CM | POA: Diagnosis not present

## 2015-06-12 ENCOUNTER — Encounter (HOSPITAL_COMMUNITY)
Admission: RE | Admit: 2015-06-12 | Discharge: 2015-06-12 | Disposition: A | Discharge status: Home / Self Care | Payer: Commercial Managed Care - HMO | Source: Ambulatory Visit | Attending: Cardiovascular Disease | Admitting: Cardiovascular Disease

## 2015-06-12 ENCOUNTER — Ambulatory Visit (HOSPITAL_COMMUNITY): Payer: Commercial Managed Care - HMO

## 2015-06-12 DIAGNOSIS — I213 ST elevation (STEMI) myocardial infarction of unspecified site: Secondary | ICD-10-CM | POA: Diagnosis not present

## 2015-06-12 DIAGNOSIS — Z955 Presence of coronary angioplasty implant and graft: Secondary | ICD-10-CM | POA: Diagnosis not present

## 2015-06-12 NOTE — Progress Notes (Signed)
Pt graduated from cardiac rehab program today with completion of 36 exercise sessions in Phase II. Pt maintained good attendance to exercise and education classes.  Pt  progressed nicely during his participation in rehab as evidenced by increased MET level.  Pt MET level increased from 2.6 to 4.2. Medication list reconciled. Repeat  PHQ score-0 .  Pt has made significant lifestyle changes and should be commended for her success. Pt feels she has made progress toward meeting her goals during cardiac rehab.  Pt feels confident in knowing what to do for exercise.  Pt desired to lose weight to about 140 pounds.  Pt did not lose weight but lost inches and her clothes feel different on and people have committed that she looked like she has lost weight.    Pt plans to continue exercise at the [facility] three times a week and walking the other days.  It was a true delight to have this pt in our program.[place] Armed [employer] operational officer, BSN

## 2015-06-14 ENCOUNTER — Ambulatory Visit (HOSPITAL_COMMUNITY): Payer: Commercial Managed Care - HMO

## 2015-06-14 ENCOUNTER — Encounter (HOSPITAL_COMMUNITY): Payer: Commercial Managed Care - HMO

## 2015-09-02 DIAGNOSIS — Z1211 Encounter for screening for malignant neoplasm of colon: Secondary | ICD-10-CM | POA: Diagnosis not present

## 2015-09-02 DIAGNOSIS — I10 Essential (primary) hypertension: Secondary | ICD-10-CM | POA: Diagnosis not present

## 2015-09-02 DIAGNOSIS — E782 Mixed hyperlipidemia: Secondary | ICD-10-CM | POA: Diagnosis not present

## 2015-09-02 DIAGNOSIS — I2510 Atherosclerotic heart disease of native coronary artery without angina pectoris: Secondary | ICD-10-CM | POA: Diagnosis not present

## 2015-09-02 DIAGNOSIS — Z1389 Encounter for screening for other disorder: Secondary | ICD-10-CM | POA: Diagnosis not present

## 2015-09-02 DIAGNOSIS — Z0000 Encounter for general adult medical examination without abnormal findings: Secondary | ICD-10-CM | POA: Diagnosis not present

## 2015-09-02 DIAGNOSIS — E039 Hypothyroidism, unspecified: Secondary | ICD-10-CM | POA: Diagnosis not present

## 2015-11-07 ENCOUNTER — Other Ambulatory Visit: Payer: Self-pay | Admitting: Cardiovascular Disease

## 2015-11-14 ENCOUNTER — Telehealth: Payer: Self-pay | Admitting: Cardiovascular Disease

## 2015-11-14 NOTE — Telephone Encounter (Signed)
Left message on voice mail - okay to use tylenol - for headache  May use mucinex ,claritin,zyrtec,benadryl - nothing with a decongestant Any question may call back.

## 2015-11-14 NOTE — Telephone Encounter (Signed)
New message      Pt had a heart attack last year.  What can she take for a cold, allergies and for a headache?

## 2015-11-18 ENCOUNTER — Telehealth: Payer: Self-pay | Admitting: Internal Medicine

## 2015-11-18 NOTE — Telephone Encounter (Signed)
New message  Patient calling the office for samples of medication:   1.  What medication and dosage are you requesting samples for? Brilinta   2.  Are you currently out of this medication? 90mg    Pt call requesting to speak with RN. Pt states me cost have went up and she is unable to afford the med. Pt would like to know if she could a one month sample supply of the med or if there was an alternate med that's affordable. Please call back to discuss.

## 2015-11-19 NOTE — Telephone Encounter (Signed)
Called patient informing her samples available.

## 2015-11-26 ENCOUNTER — Ambulatory Visit (INDEPENDENT_AMBULATORY_CARE_PROVIDER_SITE_OTHER): Payer: Commercial Managed Care - HMO | Admitting: Cardiovascular Disease

## 2015-11-26 ENCOUNTER — Encounter: Payer: Self-pay | Admitting: Cardiovascular Disease

## 2015-11-26 VITALS — BP 116/70 | HR 59 | Ht 61.0 in | Wt 154.4 lb

## 2015-11-26 DIAGNOSIS — I10 Essential (primary) hypertension: Secondary | ICD-10-CM

## 2015-11-26 DIAGNOSIS — E785 Hyperlipidemia, unspecified: Secondary | ICD-10-CM

## 2015-11-26 DIAGNOSIS — E039 Hypothyroidism, unspecified: Secondary | ICD-10-CM

## 2015-11-26 MED ORDER — TICAGRELOR 60 MG PO TABS: 60.0000 mg | ORAL_TABLET | Freq: Two times a day (BID) | ORAL | 3 refills | Status: DC

## 2015-11-26 MED ORDER — ROSUVASTATIN CALCIUM 20 MG PO TABS: 20.0000 mg | ORAL_TABLET | Freq: Every day | ORAL | 3 refills | Status: DC

## 2015-11-26 NOTE — Patient Instructions (Addendum)
Your physician has recommended you make the following change in your medication:   1.) STOP the pravastatin. This has been replaced with rouvastatin 20 mg.  2.) the Brilinta has been changed to 60 mg twice a day. Start this once the 90 mg tablets has been completed.  3.) start 81 mg ASA daily.  Your physician recommends that you return for lab work in: 3 months.   Your physician wants you to follow-up in: 6 months or sooner if needed. You will receive a reminder letter in the mail two months in advance. If you don't receive a letter, please call our office to schedule the follow-up appointment.  If you need a refill on your cardiac medications before your next appointment, please call your pharmacy.

## 2015-11-28 NOTE — Progress Notes (Signed)
Patient ID: Cathy Reilly, female   DOB: [birth year], 75 y.o.   MRN: [id number]    PCP: Dr. Carol Ada  HPI: Cathy Reilly is a 75 y.o. female who presents to the office today for a 7 month follow up cardiology evaluation.   Cathy Reilly has a history of hypertension and hypothyroidism.  She was admitted in the early morning on 12/19/2014 with a symptom complex suggestive of unstable angina.  She ruled in for non-ST segment elevation MI with initial troponin of 5.  She was referred for cardiac catheterization.  In the catheterization laboratory she developed an episode of VT/VF and required defibrillation.  She underwent successful intervention to a subtotally/totally occluded LAD which had diffuse proximal to mid disease and was successfully stented with a 3033 mm Xience Alpine stent.  She was also felt to have a possible spontaneous dissection with a 90% distal intermediate stenosis ulcerated plaque.  She was maintained on anticoagulation and several days later was brought back to the catheterization laboratory.  Her LAD stent was widely patent and there was resolution of her prior intermediate stenosis.  Subsequent, she has remained stable.  She is participating in phase II cardiac rehabilitation.  She denies recurrent anginal symptoms.  She admits to being active.  She is unaware of any palpitations.  Since I last saw her, he continues to be on aspirin 81 mg and Brilinta 90 twice a day for dual antiplatelet therapy.  She is on metoprolol 37.5 twice a day, lisinopril 10 mg daily, amlodipine 5 mg for hypertension and post MI.  She also is still on pravastatin 80 mg daily.  He denies any episodes of chest pain, PND, orthopnea, presyncope or syncope.  Past Medical History:  Diagnosis Date  . Anxiety   . Bronchitis 12/2014  . CAD (coronary artery disease), native coronary artery s/p DES to LAD 12/20/2014 01/03/2015  . Hypertension   . Hypothyroidism   . NSTEMI (non-ST elevated myocardial  infarction) ([place]) 12/2014  . Osteopenia    hip  . Prediabetes Hgb A1C 6.0 01/03/2015  . Thyroid disease   . VF (ventricular fibrillation) during cath on 12/20/2014 from occluded mid LAD, s/p defib     Past Surgical History:  Procedure Laterality Date  . APPENDECTOMY    . CARDIAC CATHETERIZATION N/A 12/20/2014   Procedure: Left Heart Cath and Coronary Angiography;  Surgeon: Troy Sine, MD;  Location: [place] [facility];  Service: Cardiovascular;  Laterality: N/A;  . CARDIAC CATHETERIZATION N/A 12/20/2014   Procedure: Coronary Stent Intervention;  Surgeon: Troy Sine, MD;  Location: [place] [facility];  Service: Cardiovascular;  Laterality: N/A;  . CARDIAC CATHETERIZATION N/A 12/24/2014   Procedure: Left Heart Cath and Coronary Angiography;  Surgeon: Jettie Booze, MD;  Location: [place] [facility];  Service: Cardiovascular;  Laterality: N/A;  . CHOLECYSTECTOMY    . CORONARY ANGIOPLASTY WITH STENT PLACEMENT    . TONSILLECTOMY      Allergies  Allergen Reactions  . Codeine Nausea And Vomiting  . Crestor [Rosuvastatin] Other (See Comments)    myaglia  . Lipitor [Atorvastatin] Other (See Comments)    myaglia  . Septra [Sulfamethoxazole-Trimethoprim] Nausea And Vomiting  . Simvastatin Other (See Comments)    myaglia  . Welchol [Colesevelam Hcl] Other (See Comments)    myaglia  . Prednisone Palpitations    Current Outpatient Prescriptions  Medication Sig Dispense Refill  . amLODipine (NORVASC) 5 MG tablet Take 5 mg by mouth daily.    Marland Kitchen  aspirin 81 MG tablet Take 81 mg by mouth daily.    Marland Kitchen co-enzyme Q-10 30 MG capsule Take 100 mg by mouth daily.    Marland Kitchen levothyroxine (SYNTHROID, LEVOTHROID) 88 MCG tablet Take 88 mcg by mouth daily before breakfast.    . lisinopril (PRINIVIL,ZESTRIL) 10 MG tablet Take 1 tablet (10 mg total) by mouth daily. 90 tablet 3  . metoprolol tartrate (LOPRESSOR) 25 MG tablet Take 1.5 tablets (37.5 mg total) by mouth 2 (two) times daily. 270 tablet 3    . nitroGLYCERIN (NITROSTAT) 0.4 MG SL tablet Place 1 tablet (0.4 mg total) under the tongue every 5 (five) minutes x 3 doses as needed for chest pain. 100 tablet 0  . rosuvastatin (CRESTOR) 20 MG tablet Take 1 tablet (20 mg total) by mouth daily. 90 tablet 3  . ticagrelor (BRILINTA) 60 MG TABS tablet Take 1 tablet (60 mg total) by mouth 2 (two) times daily. 180 tablet 3   No current facility-administered medications for this visit.     Social History   Social History  . Marital status: Married    Spouse name: N/A  . Number of children: N/A  . Years of education: N/A   Occupational History  . Not on file.   Social History Main Topics  . Smoking status: Never Smoker  . Smokeless tobacco: Never Used  . Alcohol use No  . Drug use: No  . Sexual activity: Not on file   Other Topics Concern  . Not on file   Social History Narrative  . No narrative on file    Family History  Problem Relation Age of Onset  . Hypertension Mother   . Hypertension Father   . Hypertension Sister   . Cancer Sister   . Hypertension Brother   . Cancer Brother   . CVA Brother   . Transient ischemic attack Sister     ROS General: Negative; No fevers, chills, or night sweats HEENT: Negative; No changes in vision or hearing, sinus congestion, difficulty swallowing Pulmonary: Negative; No cough, wheezing, shortness of breath, hemoptysis Cardiovascular: See HPI: No chest pain, presyncope, syncope, palpatations GI: Negative; No nausea, vomiting, diarrhea, or abdominal pain GU: Negative; No dysuria, hematuria, or difficulty voiding Musculoskeletal: Negative; no myalgias, joint pain, or weakness Hematologic: Negative; no easy bruising, bleeding Endocrine: Negative; no heat/cold intolerance; no diabetes, Neuro: Negative; no changes in balance, headaches Skin: Negative; No rashes or skin lesions Psychiatric: Negative; No behavioral problems, depression Sleep: Negative; No snoring,  daytime sleepiness,  hypersomnolence, bruxism, restless legs, hypnogognic hallucinations. Other comprehensive 14 point system review is negative   Physical Exam BP 116/70   Pulse (!) 59   Ht 5\' 1"  (1.549 m)   Wt 154 lb 6.4 oz (70 kg)   BMI 29.17 kg/m  Wt Readings from Last 3 Encounters:  11/26/15 154 lb 6.4 oz (70 kg)  04/18/15 151 lb (68.5 kg)  03/07/15 148 lb 13 oz (67.5 kg)   General: Alert, oriented, no distress.  Skin: normal turgor, no rashes, warm and dry HEENT: Normocephalic, atraumatic. Pupils equal round and reactive to light; sclera anicteric; extraocular muscles intact, No lid lag; Nose without nasal septal hypertrophy; Mouth/Parynx benign; Mallinpatti scale 2/3 Neck: No JVD, no carotid bruits; normal carotid upstroke Lungs: clear to ausculatation and percussion bilaterally; no wheezing or rales, normal inspiratory and expiratory effort Chest wall: without tenderness to palpitation Heart: PMI not displaced, RRR, s1 s2 normal, 1/6 systolic murmur, No diastolic murmur, no rubs, gallops, thrills, or  heaves Abdomen: soft, nontender; no hepatosplenomehaly, BS+; abdominal aorta nontender and not dilated by palpation. Back: no CVA tenderness Pulses: 2+  Musculoskeletal: full range of motion, normal strength, no joint deformities Extremities: Pulses 2+, no clubbing cyanosis or edema, Homan's sign negative  Neurologic: grossly nonfocal; Cranial nerves grossly wnl Psychologic: Normal mood and affect  ECG (independently read by me): Sinus bradycardia 59 bpm.  No ECG evidence of her prior MI.  He served R waves inferiorly with small Q wave in 3.  ECG (independently read by me):  Sinus bradycardia 59 bpm.  No ectopy.  No ECG criteria for her MI.  LABS:  BMP Latest Ref Rng & Units 04/26/2015 01/04/2015 01/03/2015  Glucose 65 - 99 mg/dL 94 100(H) -  BUN 7 - 25 mg/dL 12 12 -  Creatinine 0.60 - 0.93 mg/dL 0.71 0.90 0.90  Sodium 135 - 146 mmol/L 140 140 -  Potassium 3.5 - 5.3 mmol/L 4.3 4.0 -  Chloride  98 - 110 mmol/L 102 104 -  CO2 20 - 31 mmol/L 29 29 -  Calcium 8.6 - 10.4 mg/dL 9.9 9.4 -     Hepatic Function Latest Ref Rng & Units 04/26/2015  Total Protein 6.1 - 8.1 g/dL 7.3  Albumin 3.6 - 5.1 g/dL 4.1  AST 10 - 35 U/L 18  ALT 6 - 29 U/L 13  Alk Phosphatase 33 - 130 U/L 75  Total Bilirubin 0.2 - 1.2 mg/dL 1.0    CBC Latest Ref Rng & Units 04/26/2015 01/03/2015 01/03/2015  WBC 4.0 - 10.5 K/uL 6.2 9.9 9.9  Hemoglobin 12.0 - 15.0 g/dL 13.8 13.0 12.6  Hematocrit 36.0 - 46.0 % 40.1 38.7 37.3  Platelets 150 - 400 K/uL 291 462(H) 434(H)   Lab Results  Component Value Date   MCV 85.5 04/26/2015   MCV 88.2 01/03/2015   MCV 88.8 01/03/2015    Lab Results  Component Value Date   TSH 1.84 04/26/2015    BNP    Component Value Date/Time   BNP 552.5 (H) 01/03/2015 1000    ProBNP No results found for: PROBNP   Lipid Panel     Component Value Date/Time   CHOL 165 04/26/2015 0958   TRIG 103 04/26/2015 0958   HDL 46 04/26/2015 0958   CHOLHDL 3.6 04/26/2015 0958   VLDL 21 04/26/2015 0958   LDLCALC 98 04/26/2015 0958     RADIOLOGY: No results found.    ASSESSMENT AND PLAN:  [name] is a 75 year old female who suffered a non-ST segment elevation MI in November 2016 and underwent successful stenting of a subtotally occluded LAD extending proximally into the mid vessel. She developed VT/VF requiring defibrillation in the laboratory.  At the time she was also felt to have a possible ulcerated plaque in the distal ramus immediate vessel, which ultimately cleared with anticoagulation therapy.  Clinically, she has been angina free.  She is participating in cardiac rehabilitation.  Her blood pressure today is stable on her medical regimen consisting of metoprolol 37.5 mg twice a day, lisinopril 10 mg daily, and amlodipine 5 mg.  She is on aspirin and Brilinta for antiplatelet therapy.  She also is on pravastatin 80 mg for hyperlipidemia.  She has a history of hypothyroidism  and is on Synthroid 88 g.  with her ACS history and CAD, I have recommended that she discontinue pravastatin 80 mg and in its place start Crestor 20 mg for more high potency statin with target LDL less than 70.  I  also reviewed with her the [facility] trial data and commencing in December 2017, she will change her Brilinta to 60 mg twice a day and continue aspirin 81 mg.  In 2 months follow-up laboratory will be obtained and I will contact her regarding the results.  I will see her in 6 months for cardiology reevaluation.  Tme spent: 25 minutes  Troy Sine, MD, [facility]  11/28/2015 3:01 PM

## 2015-12-16 ENCOUNTER — Telehealth: Payer: Self-pay | Admitting: Cardiovascular Disease

## 2015-12-16 MED ORDER — TICAGRELOR 60 MG PO TABS: 60.0000 mg | ORAL_TABLET | Freq: Two times a day (BID) | ORAL | 11 refills | Status: DC

## 2015-12-16 MED ORDER — ROSUVASTATIN CALCIUM 20 MG PO TABS: 20.0000 mg | ORAL_TABLET | Freq: Every day | ORAL | 11 refills | Status: DC

## 2015-12-16 NOTE — Telephone Encounter (Signed)
Patient returned my call. She notes she needed the Rx(s) sent to local pharmacy for 30 day supply - [facility] on [address]. I have done so. I also offered samples of the Brilinta and she will pick these up. Pt voiced thanks for call. No further needs at this time.

## 2015-12-16 NOTE — Telephone Encounter (Signed)
New message    Pt has question for rn pertaining to new prescription for berlonta 60 mg and crestor

## 2015-12-16 NOTE — Telephone Encounter (Signed)
Left msg to call.

## 2016-01-08 ENCOUNTER — Telehealth: Payer: Self-pay | Admitting: Cardiovascular Disease

## 2016-01-08 NOTE — Telephone Encounter (Signed)
Medication samples have been provided to the patient. Drug name: Brilinta 60 mg Qty: 56 tabs LOT: [id number] Exp.Date: 08/2016 Drug name: Brilinta 60 mg Qty: 28 tabs LOT: [id number] Exp.Date: 05/2016 Samples left at front desk for patient pick-up.  lmtcb

## 2016-01-08 NOTE — Telephone Encounter (Signed)
Patient wants to know if we have any samples of Brilinta 60mg .  She is running out and was told to call for samples.

## 2016-02-05 ENCOUNTER — Other Ambulatory Visit: Payer: Self-pay | Admitting: Cardiovascular Disease

## 2016-02-05 NOTE — Telephone Encounter (Signed)
REFILL 

## 2016-02-21 ENCOUNTER — Telehealth: Payer: Self-pay | Admitting: Cardiovascular Disease

## 2016-02-21 MED ORDER — ROSUVASTATIN CALCIUM 20 MG PO TABS: 20.0000 mg | ORAL_TABLET | Freq: Every day | ORAL | 0 refills | Status: DC

## 2016-02-21 NOTE — Telephone Encounter (Signed)
°*  STAT* If patient is at the pharmacy, call can be transferred to refill team.   1. Which medications need to be refilled? (please list name of each medication and dose if known)Rosuvastatin 20 mg   2. Which pharmacy/location (including street and city if local pharmacy) is medication to be sent to?[facility]   3. Do they need a 30 day or 90 day supply?[place]

## 2016-02-21 NOTE — Telephone Encounter (Signed)
Patient calling the office for samples of medication:   1.  What medication and dosage are you requesting samples for? Brilinta    2.  Are you currently out of this medication? No  Has enough for this week

## 2016-02-21 NOTE — Telephone Encounter (Signed)
Refill sent.

## 2016-02-25 ENCOUNTER — Telehealth: Payer: Self-pay | Admitting: Cardiovascular Disease

## 2016-02-25 NOTE — Telephone Encounter (Signed)
New message       Patient calling the office for samples of medication:   1.  What medication and dosage are you requesting samples for? brilinta 60mg   2.  Are you currently out of this medication? Almost out Please call pt and let her know if we have any samples

## 2016-02-25 NOTE — Telephone Encounter (Signed)
Spoke with patient and will leave her 2 weeks of samples of Brilinta at front Brilinta 60 mg twice a day Exp 7/18 Lot # [id number] Patient is going to check prices for mail order and [facility] and call with pharmacy to send Rx

## 2016-02-27 DIAGNOSIS — J111 Influenza due to unidentified influenza virus with other respiratory manifestations: Secondary | ICD-10-CM | POA: Diagnosis not present

## 2016-02-28 ENCOUNTER — Telehealth: Payer: Self-pay | Admitting: Cardiovascular Disease

## 2016-02-28 MED ORDER — ROSUVASTATIN CALCIUM 20 MG PO TABS: 20.0000 mg | ORAL_TABLET | Freq: Every day | ORAL | 1 refills | Status: DC

## 2016-02-28 NOTE — Telephone Encounter (Signed)
Spoke with pt, she would like her rosuvastatin to [facility]. She is going to have lab work done with her medical doctor and have that forwarded to [place]. Refill sent to the pharmacy electronically.

## 2016-02-28 NOTE — Telephone Encounter (Signed)
New Message  Pt voiced on her medicine bottle has up until Nov. 2018 for refills and no appt is needed to continue her refills up until the expiration date on her medication bottle.  Please f/u with pt

## 2016-03-10 DIAGNOSIS — F5101 Primary insomnia: Secondary | ICD-10-CM | POA: Diagnosis not present

## 2016-03-10 DIAGNOSIS — E039 Hypothyroidism, unspecified: Secondary | ICD-10-CM | POA: Diagnosis not present

## 2016-03-10 DIAGNOSIS — I2510 Atherosclerotic heart disease of native coronary artery without angina pectoris: Secondary | ICD-10-CM | POA: Diagnosis not present

## 2016-03-10 DIAGNOSIS — I10 Essential (primary) hypertension: Secondary | ICD-10-CM | POA: Diagnosis not present

## 2016-03-10 DIAGNOSIS — E782 Mixed hyperlipidemia: Secondary | ICD-10-CM | POA: Diagnosis not present

## 2016-03-16 ENCOUNTER — Telehealth: Payer: Self-pay | Admitting: Cardiovascular Disease

## 2016-03-16 NOTE — Telephone Encounter (Signed)
New Message   Patient calling the office for samples of medication:   1.  What medication and dosage are you requesting samples for? ticagrelor (BRILINTA) 60 MG TABS tablet  2.  Are you currently out of this medication? Yes

## 2016-03-16 NOTE — Telephone Encounter (Signed)
Medication Samples have been provided to the patient.  Drug name: BriltintaQty:56 LOT: [id number].Date: 08/2016  The patient has been instructed regarding the correct time, dose, and frequency of taking this medication, including desired effects and most common side effects.

## 2016-03-30 ENCOUNTER — Other Ambulatory Visit: Payer: Self-pay | Admitting: Cardiovascular Disease

## 2016-03-30 MED ORDER — ROSUVASTATIN CALCIUM 20 MG PO TABS: 20.0000 mg | ORAL_TABLET | Freq: Every day | ORAL | 0 refills | Status: DC

## 2016-03-30 NOTE — Telephone Encounter (Signed)
New message    *STAT* If patient is at the pharmacy, call can be transferred to refill team.   1. Which medications need to be refilled? (please list name of each medication and dose if known) rosuvastatin 20 mg  2. Which pharmacy/location (including street and city if local pharmacy) is medication to be sent to? [facility] phone-[phone]  3. Do they need a 30 day or 90 day supply? 90 day

## 2016-03-30 NOTE — Addendum Note (Signed)
Addended by: Fidel Levy on: 03/30/2016 02:14 PM   Modules accepted: Orders

## 2016-03-30 NOTE — Telephone Encounter (Signed)
Rx(s) sent to pharmacy electronically.  

## 2016-04-14 ENCOUNTER — Telehealth: Payer: Self-pay | Admitting: Cardiovascular Disease

## 2016-04-14 NOTE — Telephone Encounter (Signed)
Returned call to patient-made aware samples are avalaible and will be at front desk for pickup.  Also aware lab orders are active-patient request to go to lab downstairs.    Medication samples have been provided to the patient.  Drug name: Brilinta 60mg   Qty: 56(28 DAYS) LOT: [id number]  Exp.Date: 07/18

## 2016-04-14 NOTE — Telephone Encounter (Signed)
New Message   Patient calling the office for samples of medication:  Pt also states that she needs to come in for blood work. No current order   1.  What medication and dosage are you requesting samples for? ticagrelor (BRILINTA) 60 MG TABS tablet  2.  Are you currently out of this medication? No has enough for this week

## 2016-04-20 DIAGNOSIS — I10 Essential (primary) hypertension: Secondary | ICD-10-CM | POA: Diagnosis not present

## 2016-04-20 DIAGNOSIS — E785 Hyperlipidemia, unspecified: Secondary | ICD-10-CM | POA: Diagnosis not present

## 2016-04-21 LAB — COMPREHENSIVE METABOLIC PANEL
ALT: 14 U/L (ref 6–29)
AST: 20 U/L (ref 10–35)
Albumin: 4.3 g/dL (ref 3.6–5.1)
Alkaline Phosphatase: 67 U/L (ref 33–130)
BUN: 9 mg/dL (ref 7–25)
CO2: 29 mmol/L (ref 20–31)
Calcium: 9.7 mg/dL (ref 8.6–10.4)
Chloride: 104 mmol/L (ref 98–110)
Creat: 0.61 mg/dL (ref 0.60–0.93)
GLUCOSE: 96 mg/dL (ref 65–99)
POTASSIUM: 4.4 mmol/L (ref 3.5–5.3)
Sodium: 141 mmol/L (ref 135–146)
TOTAL PROTEIN: 6.8 g/dL (ref 6.1–8.1)
Total Bilirubin: 1.2 mg/dL (ref 0.2–1.2)

## 2016-04-21 LAB — LIPID PANEL
CHOL/HDL RATIO: 2.8 ratio (ref <5.0)
Cholesterol: 123 mg/dL (ref <200)
HDL: 44 mg/dL — AB (ref >50)
LDL CALC: 58 mg/dL (ref <100)
Triglycerides: 105 mg/dL (ref <150)
VLDL: 21 mg/dL (ref <30)

## 2016-04-30 DIAGNOSIS — I10 Essential (primary) hypertension: Secondary | ICD-10-CM | POA: Diagnosis not present

## 2016-04-30 DIAGNOSIS — H35033 Hypertensive retinopathy, bilateral: Secondary | ICD-10-CM | POA: Diagnosis not present

## 2016-04-30 DIAGNOSIS — Z0100 Encounter for examination of eyes and vision without abnormal findings: Secondary | ICD-10-CM | POA: Diagnosis not present

## 2016-05-18 ENCOUNTER — Telehealth: Payer: Self-pay | Admitting: Cardiovascular Disease

## 2016-05-18 MED ORDER — TICAGRELOR 60 MG PO TABS: 60.0000 mg | ORAL_TABLET | Freq: Two times a day (BID) | ORAL | 0 refills | Status: DC

## 2016-05-18 NOTE — Telephone Encounter (Signed)
Patient aware samples already for pick up.  Boxes x3

## 2016-05-18 NOTE — Telephone Encounter (Signed)
Patient calling the office for samples of medication:   1.  What medication and dosage are you requesting samples for? Brilinta 60 mg  2.  Are you currently out of this medication?

## 2016-06-04 ENCOUNTER — Telehealth: Payer: Self-pay | Admitting: Cardiovascular Disease

## 2016-06-04 NOTE — Telephone Encounter (Signed)
Samples at the front desk Pt notified 

## 2016-06-04 NOTE — Telephone Encounter (Signed)
New message     Patient calling the office for samples of medication:   1.  What medication and dosage are you requesting samples for? Brillinta 60mg    2.  Are you currently out of this medication?  Enough until monday

## 2016-06-16 ENCOUNTER — Encounter: Payer: Self-pay | Admitting: Cardiovascular Disease

## 2016-06-16 ENCOUNTER — Ambulatory Visit (INDEPENDENT_AMBULATORY_CARE_PROVIDER_SITE_OTHER): Payer: Medicare HMO | Admitting: Cardiovascular Disease

## 2016-06-16 VITALS — BP 163/74 | HR 60 | Ht 61.0 in | Wt 157.8 lb

## 2016-06-16 DIAGNOSIS — I25118 Atherosclerotic heart disease of native coronary artery with other forms of angina pectoris: Secondary | ICD-10-CM

## 2016-06-16 DIAGNOSIS — I214 Non-ST elevation (NSTEMI) myocardial infarction: Secondary | ICD-10-CM

## 2016-06-16 DIAGNOSIS — E785 Hyperlipidemia, unspecified: Secondary | ICD-10-CM

## 2016-06-16 DIAGNOSIS — E039 Hypothyroidism, unspecified: Secondary | ICD-10-CM

## 2016-06-16 DIAGNOSIS — I10 Essential (primary) hypertension: Secondary | ICD-10-CM

## 2016-06-16 MED ORDER — TICAGRELOR 60 MG PO TABS: 60.0000 mg | ORAL_TABLET | Freq: Two times a day (BID) | ORAL | 6 refills | Status: DC

## 2016-06-16 MED ORDER — LISINOPRIL 20 MG PO TABS: 20.0000 mg | ORAL_TABLET | Freq: Every day | ORAL | 3 refills | Status: DC

## 2016-06-16 MED ORDER — LISINOPRIL 20 MG PO TABS
20.0000 mg | ORAL_TABLET | Freq: Every day | ORAL | 3 refills | Status: DC
Start: 2016-06-16 — End: 2016-06-16

## 2016-06-16 NOTE — Patient Instructions (Signed)
MEDICATION CHANGE   INCREASE LISINOPRIL TO 20 MG      NO OTHER CHANGES    Your physician recommends that you schedule a follow-up appointment in [place].    If you need a refill on your cardiac medications before your next appointment, please call your pharmacy.

## 2016-06-16 NOTE — Progress Notes (Signed)
Patient ID: Cathy Reilly, female   DOB: [birth year], 76 y.o.   MRN: [id number]    PCP: Dr. Carol Ada  HPI: Cathy Reilly is a 76 y.o. female who presents to the office today for a 7 month follow up cardiology evaluation.   [name] has a history of hypertension and hypothyroidism.  She was admitted in the early morning on 12/19/2014 with a symptom complex suggestive of unstable angina.  She ruled in for non-ST segment elevation MI with initial troponin of 5.  She was referred for cardiac catheterization.  In the catheterization laboratory she developed an episode of VT/VF and required defibrillation.  She underwent successful intervention to a subtotally/totally occluded LAD which had diffuse proximal to mid disease and was successfully stented with a 3033 mm Xience Alpine stent.  She was also felt to have a possible spontaneous dissection with a 90% distal intermediate stenosis ulcerated plaque.  She was maintained on anticoagulation and several days later was brought back to the catheterization laboratory.  Her LAD stent was widely patent and there was resolution of her prior intermediate stenosis.  Subsequent, she has remained stable.  She is participating in phase II cardiac rehabilitation.  She denies recurrent anginal symptoms.  She admits to being active.  She is unaware of any palpitations.  When I last saw her, she had been on aspirin 81 mg and Brilinta 90 twice a day for dual antiplatelet therapy. I discussed the [place]asus trial data and ultimately switched her to 60 mg, Brilinta twice a day, which she has been taking and tolerating. She is on metoprolol 37.5 twice a day, lisinopril 10 mg daily, amlodipine 5 mg for hypertension and post MI.  7.  However, previous pravastatin 80 mg daily, she is now on rosuvastatin 20 mg for hyperlipidemia.  She denies any episodes of chest pain, PND, orthopnea, presyncope or syncope.  Past Medical History:  Diagnosis Date  . Anxiety   . Bronchitis  12/2014  . CAD (coronary artery disease), native coronary artery s/p DES to LAD 12/20/2014 01/03/2015  . Hypertension   . Hypothyroidism   . NSTEMI (non-ST elevated myocardial infarction) ([place]) 12/2014  . Osteopenia    hip  . Prediabetes Hgb A1C 6.0 01/03/2015  . Thyroid disease   . VF (ventricular fibrillation) during cath on 12/20/2014 from occluded mid LAD, s/p defib     Past Surgical History:  Procedure Laterality Date  . APPENDECTOMY    . CARDIAC CATHETERIZATION N/A 12/20/2014   Procedure: Left Heart Cath and Coronary Angiography;  Surgeon: Troy Sine, MD;  Location: [facility];  Service: Cardiovascular;  Laterality: N/A;  . CARDIAC CATHETERIZATION N/A 12/20/2014   Procedure: Coronary Stent Intervention;  Surgeon: Troy Sine, MD;  Location: [facility];  Service: Cardiovascular;  Laterality: N/A;  . CARDIAC CATHETERIZATION N/A 12/24/2014   Procedure: Left Heart Cath and Coronary Angiography;  Surgeon: Jettie Booze, MD;  Location: [facility];  Service: Cardiovascular;  Laterality: N/A;  . CHOLECYSTECTOMY    . CORONARY ANGIOPLASTY WITH STENT PLACEMENT    . TONSILLECTOMY      Allergies  Allergen Reactions  . Codeine Nausea And Vomiting  . Crestor [Rosuvastatin] Other (See Comments)    myaglia  . Lipitor [Atorvastatin] Other (See Comments)    myaglia  . Septra [Sulfamethoxazole-Trimethoprim] Nausea And Vomiting  . Simvastatin Other (See Comments)    myaglia  . Welchol [Colesevelam Hcl] Other (See Comments)    myaglia  .  Prednisone Palpitations    Current Outpatient Prescriptions  Medication Sig Dispense Refill  . amLODipine (NORVASC) 5 MG tablet Take 5 mg by mouth daily.    Marland Kitchen aspirin 81 MG tablet Take 81 mg by mouth daily.    Marland Kitchen levothyroxine (SYNTHROID, LEVOTHROID) 88 MCG tablet Take 88 mcg by mouth daily before breakfast.    . metoprolol tartrate (LOPRESSOR) 25 MG tablet TAKE 1 AND 1/2 TABLETS (37.5 MG TOTAL) BY MOUTH 2  TIMES DAILY.  270 tablet 1  . nitroGLYCERIN (NITROSTAT) 0.4 MG SL tablet Place 1 tablet (0.4 mg total) under the tongue every 5 (five) minutes x 3 doses as needed for chest pain. 100 tablet 0  . rosuvastatin (CRESTOR) 20 MG tablet Take 1 tablet (20 mg total) by mouth daily. <PLEASE MAKE APPOINTMENT FOR REFILLS> 90 tablet 0  . ticagrelor (BRILINTA) 60 MG TABS tablet Take 1 tablet (60 mg total) by mouth 2 (two) times daily. 28 tablet 6  . lisinopril (PRINIVIL,ZESTRIL) 20 MG tablet Take 1 tablet (20 mg total) by mouth daily. 90 tablet 3   No current facility-administered medications for this visit.     Social History   Social History  . Marital status: Married    Spouse name: N/A  . Number of children: N/A  . Years of education: N/A   Occupational History  . Not on file.   Social History Main Topics  . Smoking status: Never Smoker  . Smokeless tobacco: Never Used  . Alcohol use No  . Drug use: No  . Sexual activity: Not on file   Other Topics Concern  . Not on file   Social History Narrative  . No narrative on file    Family History  Problem Relation Age of Onset  . Hypertension Mother   . Hypertension Father   . Hypertension Sister   . Cancer Sister   . Hypertension Brother   . Cancer Brother   . CVA Brother   . Transient ischemic attack Sister     ROS General: Negative; No fevers, chills, or night sweats HEENT: Negative; No changes in vision or hearing, sinus congestion, difficulty swallowing Pulmonary: Negative; No cough, wheezing, shortness of breath, hemoptysis Cardiovascular: See HPI: No chest pain, presyncope, syncope, palpatations GI: Negative; No nausea, vomiting, diarrhea, or abdominal pain GU: Negative; No dysuria, hematuria, or difficulty voiding Musculoskeletal: Negative; no myalgias, joint pain, or weakness Hematologic: Negative; no easy bruising, bleeding Endocrine: Negative; no heat/cold intolerance; no diabetes, Neuro: Negative; no changes in balance,  headaches Skin: Negative; No rashes or skin lesions Psychiatric: Negative; No behavioral problems, depression Sleep: Negative; No snoring,  daytime sleepiness, hypersomnolence, bruxism, restless legs, hypnogognic hallucinations. Other comprehensive 14 point system review is negative   Physical Exam BP (!) 163/74   Pulse 60   Ht '5\' 1"'$  (1.549 m)   Wt 157 lb 12.8 oz (71.6 kg)   BMI 29.82 kg/m    Repeat blood pressure by me was still elevated at 166/78  Wt Readings from Last 3 Encounters:  06/16/16 157 lb 12.8 oz (71.6 kg)  11/26/15 154 lb 6.4 oz (70 kg)  04/18/15 151 lb (68.5 kg)   General: Alert, oriented, no distress.  Skin: normal turgor, no rashes, warm and dry HEENT: Normocephalic, atraumatic. Pupils equal round and reactive to light; sclera anicteric; extraocular muscles intact, No lid lag; Nose without nasal septal hypertrophy; Mouth/Parynx benign; Mallinpatti scale 2/3 Neck: No JVD, no carotid bruits; normal carotid upstroke Lungs: clear to ausculatation and percussion bilaterally;  no wheezing or rales, normal inspiratory and expiratory effort Chest wall: without tenderness to palpitation Heart: PMI not displaced, RRR, s1 s2 normal, 1/6 systolic murmur, No diastolic murmur, no rubs, gallops, thrills, or heaves Abdomen: soft, nontender; no hepatosplenomehaly, BS+; abdominal aorta nontender and not dilated by palpation. Back: no CVA tenderness Pulses: 2+  Musculoskeletal: full range of motion, normal strength, no joint deformities Extremities: Pulses 2+, no clubbing cyanosis or edema, Homan's sign negative  Neurologic: grossly nonfocal; Cranial nerves grossly wnl Psychologic: Normal mood and affect  ECG (independently read by me): Normal sinus rhythm at 60 bpm.  Preserved R waves inferiorly with small nondiagnostic Q waves in the 3  11/26/2015 ECG (independently read by me): Sinus bradycardia 59 bpm.  No ECG evidence of her prior MI.  Preserved R waves inferiorly with small  Q wave in 3.  ECG (independently read by me):  Sinus bradycardia 59 bpm.  No ectopy.  No ECG criteria for her MI.  LABS:  BMP Latest Ref Rng & Units 04/20/2016 04/26/2015 01/04/2015  Glucose 65 - 99 mg/dL 96 94 100(H)  BUN 7 - 25 mg/dL '9 12 12  '$ Creatinine 0.60 - 0.93 mg/dL 0.61 0.71 0.90  Sodium 135 - 146 mmol/L 141 140 140  Potassium 3.5 - 5.3 mmol/L 4.4 4.3 4.0  Chloride 98 - 110 mmol/L 104 102 104  CO2 20 - 31 mmol/L '29 29 29  '$ Calcium 8.6 - 10.4 mg/dL 9.7 9.9 9.4     Hepatic Function Latest Ref Rng & Units 04/20/2016 04/26/2015  Total Protein 6.1 - 8.1 g/dL 6.8 7.3  Albumin 3.6 - 5.1 g/dL 4.3 4.1  AST 10 - 35 U/L 20 18  ALT 6 - 29 U/L 14 13  Alk Phosphatase 33 - 130 U/L 67 75  Total Bilirubin 0.2 - 1.2 mg/dL 1.2 1.0    CBC Latest Ref Rng & Units 04/26/2015 01/03/2015 01/03/2015  WBC 4.0 - 10.5 K/uL 6.2 9.9 9.9  Hemoglobin 12.0 - 15.0 g/dL 13.8 13.0 12.6  Hematocrit 36.0 - 46.0 % 40.1 38.7 37.3  Platelets 150 - 400 K/uL 291 462(H) 434(H)   Lab Results  Component Value Date   MCV 85.5 04/26/2015   MCV 88.2 01/03/2015   MCV 88.8 01/03/2015    Lab Results  Component Value Date   TSH 1.84 04/26/2015    BNP    Component Value Date/Time   BNP 552.5 (H) 01/03/2015 1000    ProBNP No results found for: PROBNP   Lipid Panel     Component Value Date/Time   CHOL 123 04/20/2016 1039   TRIG 105 04/20/2016 1039   HDL 44 (L) 04/20/2016 1039   CHOLHDL 2.8 04/20/2016 1039   VLDL 21 04/20/2016 1039   LDLCALC 58 04/20/2016 1039     RADIOLOGY: No results found.  IMPRESSION: 1. Atherosclerotic heart disease of native coronary artery with other forms of angina pectoris ([place])   2. History of NSTEMI (non-ST elevated myocardial infarction) ([place])   3. Essential hypertension   4. Hyperlipidemia with target LDL less than 70   5. Hypothyroidism, unspecified type     ASSESSMENT AND PLAN:  Ms. Jacee Enerson is a 76 year old female who suffered a non-ST segment elevation MI in  November 2016 and underwent successful stenting of a subtotally occluded LAD extending proximally into the mid vessel. She developed VT/VF requiring defibrillation in the laboratory.  At the time she was also felt to have a possible ulcerated plaque in the distal ramus immediate vessel,  which ultimately cleared with anticoagulation therapy.  Clinically, she has been angina free.  When I last saw her, since her acute coronary syndrome was greater than a year.  I switched her to Brilinta 60 mg twice a day from the 90 mg twice a day regimen.  In addition, I switched her from pravastatin 80 mg to Crestor 20 mg for high potency statin therapy.  Her blood pressure today is elevated on lisinopril 10 mg, metoprolol 37.5 mg twice a day and amlodipine 5 mg.  I'm further titrating lisinopril to 20 mg daily.  She states her blood pressure at home typically runs in the 130 to 160 range.  She will continue her current dose of Crestor and laboratory in March showed an LDL cholesterol now at 58.  She continues to be on levothyroxine for hypothyroidism.  She is  not having bleeding.  I will see her in 3 months for reevaluation. Troy Sine, MD, [facility]  06/18/2016 7:30 PM

## 2016-06-26 ENCOUNTER — Telehealth: Payer: Self-pay | Admitting: Cardiovascular Disease

## 2016-06-26 MED ORDER — TICAGRELOR 60 MG PO TABS: 60.0000 mg | ORAL_TABLET | Freq: Two times a day (BID) | ORAL | 3 refills | Status: DC

## 2016-06-26 NOTE — Telephone Encounter (Signed)
Refill sent to the pharmacy electronically.  

## 2016-06-26 NOTE — Telephone Encounter (Signed)
Patient calling the office for samples of medication:   1.  What medication and dosage are you requesting samples for? Brilinta 60 mg  2.  Are you currently out of this medication?

## 2016-06-26 NOTE — Telephone Encounter (Signed)
Medication samples have been provided to the patient.  Drug name: Brilinta 60mg   Qty: 3 boxes  LOT: [id number]  Exp.Date: 08/2016  Samples left at front desk for patient pick-up. Patient notified.  Sheral Apley M 4:52 PM 06/26/2016

## 2016-06-26 NOTE — Telephone Encounter (Signed)
°*  STAT* If patient is at the pharmacy, call can be transferred to refill team.   1. Which medications need to be refilled? (please list name of each medication and dose if known) brilinta 60 mg  2. Which pharmacy/location (including street and city if local pharmacy) is medication to be sent to? [facility], [place]  3. Do they need a 30 day or 90 day supply? [place]

## 2016-07-02 ENCOUNTER — Other Ambulatory Visit: Payer: Self-pay | Admitting: Cardiovascular Disease

## 2016-07-03 NOTE — Telephone Encounter (Signed)
REFILL 

## 2016-08-07 ENCOUNTER — Other Ambulatory Visit: Payer: Self-pay | Admitting: Cardiovascular Disease

## 2016-09-07 DIAGNOSIS — Z0001 Encounter for general adult medical examination with abnormal findings: Secondary | ICD-10-CM | POA: Diagnosis not present

## 2016-09-07 DIAGNOSIS — M79672 Pain in left foot: Secondary | ICD-10-CM | POA: Diagnosis not present

## 2016-09-07 DIAGNOSIS — Z1389 Encounter for screening for other disorder: Secondary | ICD-10-CM | POA: Diagnosis not present

## 2016-09-07 DIAGNOSIS — E039 Hypothyroidism, unspecified: Secondary | ICD-10-CM | POA: Diagnosis not present

## 2016-09-07 DIAGNOSIS — M79671 Pain in right foot: Secondary | ICD-10-CM | POA: Diagnosis not present

## 2016-09-07 DIAGNOSIS — E782 Mixed hyperlipidemia: Secondary | ICD-10-CM | POA: Diagnosis not present

## 2016-09-07 DIAGNOSIS — G5601 Carpal tunnel syndrome, right upper limb: Secondary | ICD-10-CM | POA: Diagnosis not present

## 2016-09-07 DIAGNOSIS — I2510 Atherosclerotic heart disease of native coronary artery without angina pectoris: Secondary | ICD-10-CM | POA: Diagnosis not present

## 2016-09-07 DIAGNOSIS — I10 Essential (primary) hypertension: Secondary | ICD-10-CM | POA: Diagnosis not present

## 2016-09-15 ENCOUNTER — Ambulatory Visit: Payer: Medicare HMO | Admitting: Cardiovascular Disease

## 2016-10-02 ENCOUNTER — Telehealth: Payer: Self-pay | Admitting: Cardiovascular Disease

## 2016-10-02 NOTE — Telephone Encounter (Signed)
Spoke to patient, she just needed to reschedule an appt - she had inadvertently missed her august f/u. States no active concerns, had recent annual w labwork at her PCP office. I helped her arrange appt to see Dr. Claiborne Billings in November (next available). She will call sooner if new concerns.

## 2016-10-02 NOTE — Telephone Encounter (Signed)
Patient calling, would like to speak with nurse.

## 2016-10-15 ENCOUNTER — Telehealth: Payer: Self-pay | Admitting: Cardiovascular Disease

## 2016-10-15 NOTE — Telephone Encounter (Signed)
Medication samples have been provided to the patient.  Drug name: Brilinta 60mg   Qty: 28 tablets  LOT: [id number]  Exp.Date: 05/20  Samples left at front desk for patient pick-up. Patient notified.       Clarified that patient is taking 60mg  tablets BID.  Patient confirmed.

## 2016-10-15 NOTE — Telephone Encounter (Signed)
Patient calling the office for samples of medication:   1.  What medication and dosage are you requesting samples for? berlinta 30mg   2.  Are you currently out of this medication? Few days left

## 2016-10-30 ENCOUNTER — Telehealth: Payer: Self-pay | Admitting: Cardiovascular Disease

## 2016-10-30 NOTE — Telephone Encounter (Signed)
New Message     Patient calling the office for samples of medication:   1.  What medication and dosage are you requesting samples for? Brillinta 60 mg   2.  Are you currently out of this medication? yes

## 2016-10-30 NOTE — Telephone Encounter (Signed)
Medication Samples have been provided to the patient.  Drug name: Brilinta 60mg Qty: 56LOT: [id number].Date: 10-20  The patient has been instructed regarding the correct time, dose, and frequency of taking this medication, including desired effects and most common side effects.

## 2016-11-30 ENCOUNTER — Telehealth: Payer: Self-pay | Admitting: Cardiovascular Disease

## 2016-11-30 NOTE — Telephone Encounter (Signed)
Patient calling the office for samples of medication: ° ° °1.  What medication and dosage are you requesting samples for? Brilinta  ° °2.  Are you currently out of this medication? Yes  ° ° ° °

## 2016-11-30 NOTE — Telephone Encounter (Signed)
Returned call to patient spoke to husband Brilinta 60 mg samples left at [employer] office front desk.

## 2016-12-04 ENCOUNTER — Emergency Department (HOSPITAL_BASED_OUTPATIENT_CLINIC_OR_DEPARTMENT_OTHER)
Admission: EM | Admit: 2016-12-04 | Discharge: 2016-12-04 | Disposition: A | Discharge status: Home / Self Care | Payer: Medicare HMO | Attending: Emergency Medicine | Admitting: Emergency Medicine

## 2016-12-04 ENCOUNTER — Encounter (HOSPITAL_BASED_OUTPATIENT_CLINIC_OR_DEPARTMENT_OTHER): Payer: Self-pay | Admitting: *Deleted

## 2016-12-04 DIAGNOSIS — Z7982 Long term (current) use of aspirin: Secondary | ICD-10-CM | POA: Diagnosis not present

## 2016-12-04 DIAGNOSIS — I5030 Unspecified diastolic (congestive) heart failure: Secondary | ICD-10-CM | POA: Insufficient documentation

## 2016-12-04 DIAGNOSIS — I110 Hypertensive heart disease with heart failure: Secondary | ICD-10-CM | POA: Insufficient documentation

## 2016-12-04 DIAGNOSIS — Y9259 Other trade areas as the place of occurrence of the external cause: Secondary | ICD-10-CM | POA: Insufficient documentation

## 2016-12-04 DIAGNOSIS — I2510 Atherosclerotic heart disease of native coronary artery without angina pectoris: Secondary | ICD-10-CM | POA: Diagnosis not present

## 2016-12-04 DIAGNOSIS — W268XXA Contact with other sharp object(s), not elsewhere classified, initial encounter: Secondary | ICD-10-CM | POA: Insufficient documentation

## 2016-12-04 DIAGNOSIS — Z7902 Long term (current) use of antithrombotics/antiplatelets: Secondary | ICD-10-CM | POA: Diagnosis not present

## 2016-12-04 DIAGNOSIS — I252 Old myocardial infarction: Secondary | ICD-10-CM | POA: Diagnosis not present

## 2016-12-04 DIAGNOSIS — E039 Hypothyroidism, unspecified: Secondary | ICD-10-CM | POA: Insufficient documentation

## 2016-12-04 DIAGNOSIS — Z79899 Other long term (current) drug therapy: Secondary | ICD-10-CM | POA: Insufficient documentation

## 2016-12-04 DIAGNOSIS — Z955 Presence of coronary angioplasty implant and graft: Secondary | ICD-10-CM | POA: Insufficient documentation

## 2016-12-04 DIAGNOSIS — Y998 Other external cause status: Secondary | ICD-10-CM | POA: Insufficient documentation

## 2016-12-04 DIAGNOSIS — Z23 Encounter for immunization: Secondary | ICD-10-CM | POA: Diagnosis not present

## 2016-12-04 DIAGNOSIS — S61411A Laceration without foreign body of right hand, initial encounter: Secondary | ICD-10-CM | POA: Diagnosis not present

## 2016-12-04 DIAGNOSIS — Y9389 Activity, other specified: Secondary | ICD-10-CM | POA: Diagnosis not present

## 2016-12-04 MED ORDER — CEPHALEXIN 500 MG PO CAPS: 500.0000 mg | ORAL_CAPSULE | Freq: Four times a day (QID) | ORAL | 0 refills | Status: DC

## 2016-12-04 MED ORDER — TETANUS-DIPHTH-ACELL PERTUSSIS 5-2.5-18.5 LF-MCG/0.5 IM SUSP
0.5000 mL | Freq: Once | INTRAMUSCULAR | Status: AC
  Administered 2016-12-04: 0.5 mL via INTRAMUSCULAR
  Filled 2016-12-04: qty 0.5

## 2016-12-04 NOTE — ED Triage Notes (Signed)
Laceration to her right hand. 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

## 2016-12-04 NOTE — ED Notes (Signed)
Pt discharged to home with family. NAD.  

## 2016-12-04 NOTE — ED Provider Notes (Signed)
[facility] EMERGENCY DEPARTMENT Provider Note   CSN: [id number] Arrival date & time: 12/04/16  1506     History   Chief Complaint Chief Complaint  Patient presents with  . Laceration    HPI Cathy Reilly is a 76 y.o. right handed female with a history of hypertension, hypothyroidism, prediabetes who presents the emergency department today for laceration to the right hand just proximal to the MCP of the fifth digit on the dorsal aspect that occurred at approximately 12:30 PM today.  The patient states that she was at [facility] today and will go rummaging through the Halloween clearance section she hit her hand on a Christmas tin.  Patient states that she did have mild to moderate amount of bleeding for approximately 2 hours because she is on Brilinta.  Bleeding is now controlled.  She denies any pain, numbness, swelling, tingling, decreased range of motion.  Tetanus not up-to-date per patient.  HPI  Past Medical History:  Diagnosis Date  . Anxiety   . Bronchitis 12/2014  . CAD (coronary artery disease), native coronary artery s/p DES to LAD 12/20/2014 01/03/2015  . Hypertension   . Hypothyroidism   . NSTEMI (non-ST elevated myocardial infarction) ([place]) 12/2014  . Osteopenia    hip  . Prediabetes Hgb A1C 6.0 01/03/2015  . Thyroid disease   . VF (ventricular fibrillation) during cath on 12/20/2014 from occluded mid LAD, s/p defib     Patient Active Problem List   Diagnosis Date Noted  . Hyperlipidemia LDL goal <70 04/18/2015  . CAD S/P LAD DES 12/20/14 01/10/2015  . Chest pain with moderate risk for cardiac etiology - more consistent with DHF. 01/03/2015  . Atherosclerotic heart disease of native coronary artery with other forms of angina pectoris ([place]) 01/03/2015  . Essential hypertension 01/03/2015  . Hyperlipidemia with target LDL less than 70 01/03/2015  . Hypothyroidism 01/03/2015  . Prediabetes Hgb A1C 6.0 01/03/2015  . Acute diastolic heart failure ([place])  01/03/2015  . Presence of drug coated stent in LAD coronary artery   . History of NSTEMI (non-ST elevated myocardial infarction) ([place]) 12/20/2014  . VF (ventricular fibrillation) during cath on 12/20/2014 from occluded mid LAD, s/p defib     Past Surgical History:  Procedure Laterality Date  . APPENDECTOMY    . CARDIAC CATHETERIZATION N/A 12/20/2014   Procedure: Left Heart Cath and Coronary Angiography;  Surgeon: Troy Sine, MD;  Location: [facility];  Service: Cardiovascular;  Laterality: N/A;  . CARDIAC CATHETERIZATION N/A 12/20/2014   Procedure: Coronary Stent Intervention;  Surgeon: Troy Sine, MD;  Location: [facility];  Service: Cardiovascular;  Laterality: N/A;  . CARDIAC CATHETERIZATION N/A 12/24/2014   Procedure: Left Heart Cath and Coronary Angiography;  Surgeon: Jettie Booze, MD;  Location: [facility];  Service: Cardiovascular;  Laterality: N/A;  . CHOLECYSTECTOMY    . CORONARY ANGIOPLASTY WITH STENT PLACEMENT    . TONSILLECTOMY      OB History    No data available       Home Medications    Prior to Admission medications   Medication Sig Start Date End Date Taking? Authorizing Provider  amLODipine (NORVASC) 5 MG tablet Take 5 mg by mouth daily.    [provider]  aspirin 81 MG tablet Take 81 mg by mouth daily.    [provider]  levothyroxine (SYNTHROID, LEVOTHROID) 88 MCG tablet Take 88 mcg by mouth daily before breakfast.    [provider]  lisinopril (PRINIVIL,ZESTRIL) 20 MG tablet Take 1 tablet (20 mg total) by mouth daily. 06/16/16 09/14/16  Troy Sine, MD  metoprolol tartrate (LOPRESSOR) 25 MG tablet TAKE 1 AND 1/2 TABLETS TWO TIMES DAILY 08/10/16   Troy Sine, MD  nitroGLYCERIN (NITROSTAT) 0.4 MG SL tablet Place 1 tablet (0.4 mg total) under the tongue every 5 (five) minutes x 3 doses as needed for chest pain. 02/05/15   Troy Sine, MD  rosuvastatin (CRESTOR) 20 MG tablet TAKE 1 TABLET  (20 MG TOTAL) BY MOUTH DAILY. &LT;PLEASE MAKE APPOINTMENT FOR REFILLS&GT; 07/03/16   Troy Sine, MD  ticagrelor (BRILINTA) 60 MG TABS tablet Take 1 tablet (60 mg total) by mouth 2 (two) times daily. 06/26/16   Troy Sine, MD    Family History Family History  Problem Relation Age of Onset  . Hypertension Mother   . Hypertension Father   . Hypertension Sister   . Cancer Sister   . Hypertension Brother   . Cancer Brother   . CVA Brother   . Transient ischemic attack Sister     Social History Social History  Substance Use Topics  . Smoking status: Never Smoker  . Smokeless tobacco: Never Used  . Alcohol use No     Allergies   Codeine; Crestor [rosuvastatin]; Lipitor [atorvastatin]; Septra [sulfamethoxazole-trimethoprim]; Simvastatin; Welchol [colesevelam hcl]; and Prednisone   Review of Systems Review of Systems  Musculoskeletal: Negative for arthralgias and myalgias.  Skin: Positive for wound.  Neurological: Negative for weakness and numbness.     Physical Exam Updated Vital Signs BP (!) 180/66   Pulse 66   Temp 98.2 F (36.8 C) (Oral)   Resp 18   Ht 5\' 1"  (1.549 m)   Wt 68 kg (150 lb)   SpO2 99%   BMI 28.34 kg/m   Physical Exam  Constitutional: She appears well-developed and well-nourished.  HENT:  Head: Normocephalic and atraumatic.  Right Ear: External ear normal.  Left Ear: External ear normal.  Eyes: Conjunctivae are normal. Right eye exhibits no discharge. Left eye exhibits no discharge. No scleral icterus.  Cardiovascular:  Pulses:      Radial pulses are 2+ on the right side, and 2+ on the left side.  Pulmonary/Chest: Effort normal. No respiratory distress.  Musculoskeletal:  Right hand: Is a 1 cm laceration on the dorsal aspect of the hand just proximal to the fifth MCP.  The wound appears superficial and there is no exposed tendons or muscles.  Bleeding is controlled.  There is no surrounding erythema or discharge.  No TTP over flexor  sheath. TTP over wound.  Finger adduction/abduction intact with 5/5 strength.  Thumb opposition intact. Full active and resisted ROM to flexion/extension at wrist, MCP, PIP and DIP of all fingers.  FDS/FDP intact. Radial artery 2+ with <2sec cap refill. SILT in M/U/R distributions. Grip 5/5 strength.   Neurological: She is alert.  Skin: Skin is warm and dry. Capillary refill takes less than 2 seconds. Laceration noted. No pallor.  Psychiatric: She has a normal mood and affect.  Nursing note and vitals reviewed.    ED Treatments / Results  Labs (all labs ordered are listed, but only abnormal results are displayed) Labs Reviewed - No data to display  EKG  EKG Interpretation None       Radiology No results found.  Procedures .Marland KitchenLaceration Repair Date/Time: 12/04/2016 6:24 PM Performed by: Jillyn Ledger Authorized by: Jillyn Ledger   Consent:  Consent obtained:  Verbal   Consent given by:  Patient   Risks discussed:  Infection, need for additional repair, nerve damage, poor wound healing, poor cosmetic result, pain, retained foreign body, tendon damage and vascular damage   Alternatives discussed:  No treatment Anesthesia (see MAR for exact dosages):    Anesthesia method:  None Laceration details:    Location:  Hand   Hand location:  R hand, dorsum   Length (cm):  1 Repair type:    Repair type:  Simple Pre-procedure details:    Preparation:  Patient was prepped and draped in usual sterile fashion Treatment:    Area cleansed with:  Shur-Clens   Amount of cleaning:  Standard   Irrigation solution:  Sterile saline   Irrigation volume:  500   Irrigation method:  Syringe   Visualized foreign bodies/material removed: no   Skin repair:    Repair method:  Tissue adhesive Approximation:    Approximation:  Close Post-procedure details:    Patient tolerance of procedure:  Tolerated well, no immediate complications   (including critical care time)  Medications  Ordered in ED Medications - No data to display   Initial Impression / Assessment and Plan / ED Course  I have reviewed the triage vital signs and the nursing notes.  Pertinent labs & imaging results that were available during my care of the patient were reviewed by me and considered in my medical decision making (see chart for details).      Patient with laceration to right (dominant) hand. No tendon or nerve damage on exam. Patient is NVI. Bleeding is controlled. Pressure irrigation performed. Wound explored and base of wound visualized in a bloodless field without evidence of foreign body.  Laceration occurred < 8 hours prior to repair which was well tolerated with dermabond. Tdap updated.Pt discharged with Keflex.  Discussed suture home care with patient and answered questions. Pt to follow-up for wound check and are to return to the ED sooner for signs of infection. Pt is hemodynamically stable with no complaints prior to dc.   Final Clinical Impressions(s) / ED Diagnoses   Final diagnoses:  Laceration of right hand without foreign body, initial encounter    New Prescriptions New Prescriptions   CEPHALEXIN (KEFLEX) 500 MG CAPSULE    Take 1 capsule (500 mg total) by mouth 4 (four) times daily.     Jillyn Ledger, PA-C 12/04/16 [place], MD 12/06/16 [phone]

## 2016-12-04 NOTE — Discharge Instructions (Signed)
You were seen here today because of a laceration. A laceration is a cut or lesion that goes through all layers of the skin and into the tissue just beneath the skin. Your laceration has been repaired with dermabond. The film will usually remain in place for 5-10 days, then naturally fall off your skin. Keep the bandaging dry. Replace the dressing daily until the adhesive film has fallen off or if the bandage should become wet. When changing the dressing, do not apply tape directly over the dermabond adhesive film as removing the tape later may also remove the film. Do not apply topical liquids or ointments to the area while the dermabond is in place. This may loosen the film. You may occasionally breifely wet your wound in a shower or bath. Do not soak or scrub your wound. Do not swim. Avoid periods of heavy perspiration. After showering, gently blot your wound dry with a soft towel and apply new clean bandage. Protect your wound from injury. Do not scratch, rub or pick at the Dermabond film.    Please take all of your antibiotics until finished!   You may develop abdominal discomfort or diarrhea from the antibiotic.  You may help offset this with probiotics which you can buy or get in yogurt. Do not eat or take the probiotics until 2 hours after your antibiotic. Do not take your medicine if develop an itchy rash, swelling in your mouth or lips, or difficulty breathing.    SEEK MEDICAL CARE IF:  You have redness, swelling, or increasing pain in the wound.  You see a red line that goes away from the wound.  You have yellowish-white fluid (pus) coming from the wound.  You have a fever.  You notice a bad smell coming from the wound or dressing.  Your wound breaks open before or after sutures have been removed.  You notice something coming out of the wound such as wood or glass.  Your wound is on your hand or foot and you cannot move a finger or toe.  Your pain is not controlled with prescribed medicine.    Additional Information:  If you did not receive a tetanus shot today because you thought you were up to date, but did not recall when your last one was given, make sure to check with your primary caregiver to determine if you need one.   Your vital signs today were: BP (!) 180/66    Pulse 66    Temp 98.2 F (36.8 C) (Oral)    Resp 18    Ht 5\' 1"  (1.549 m)    Wt 68 kg (150 lb)    SpO2 99%    BMI 28.34 kg/m  If your blood pressure (BP) was elevated above 135/85 this visit, please have this repeated by your doctor within one month.Marland Kitchen

## 2016-12-26 ENCOUNTER — Other Ambulatory Visit: Payer: Self-pay | Admitting: Cardiovascular Disease

## 2016-12-29 ENCOUNTER — Encounter: Payer: Self-pay | Admitting: Cardiovascular Disease

## 2016-12-29 ENCOUNTER — Ambulatory Visit: Payer: Medicare HMO | Admitting: Cardiovascular Disease

## 2016-12-29 VITALS — BP 164/72 | HR 57 | Ht 61.0 in | Wt 154.8 lb

## 2016-12-29 DIAGNOSIS — I10 Essential (primary) hypertension: Secondary | ICD-10-CM

## 2016-12-29 DIAGNOSIS — I214 Non-ST elevation (NSTEMI) myocardial infarction: Secondary | ICD-10-CM | POA: Diagnosis not present

## 2016-12-29 DIAGNOSIS — E785 Hyperlipidemia, unspecified: Secondary | ICD-10-CM | POA: Diagnosis not present

## 2016-12-29 DIAGNOSIS — E039 Hypothyroidism, unspecified: Secondary | ICD-10-CM | POA: Diagnosis not present

## 2016-12-29 MED ORDER — AMLODIPINE BESYLATE 5 MG PO TABS: 7.5000 mg | ORAL_TABLET | Freq: Every day | ORAL | 3 refills | Status: DC

## 2016-12-29 NOTE — Progress Notes (Signed)
Patient ID: Cathy Reilly, female   DOB: [birth year], 76 y.o.   MRN: [id number]    PCP: Dr. Carol Ada  HPI: Cathy Reilly is a 76 y.o. female who presents to the office today for a 6 month follow up cardiology evaluation.   Cathy Reilly has a history of hypertension and hypothyroidism.  She was admitted in the early morning on 12/19/2014 with a symptom complex suggestive of unstable angina.  She ruled in for non-ST segment elevation MI with initial troponin of 5.  She was referred for cardiac catheterization.  In the catheterization laboratory she developed an episode of VT/VF and required defibrillation.  She underwent successful intervention to a subtotally/totally occluded LAD which had diffuse proximal to mid disease and was successfully stented with a 3033 mm Xience Alpine stent.  She was also felt to have a possible spontaneous dissection with a 90% distal intermediate stenosis ulcerated plaque.  She was maintained on anticoagulation and several days later was brought back to the catheterization laboratory.  Her LAD stent was widely patent and there was resolution of her prior intermediate stenosis.  Subsequent, she has remained stable.  She is participating in phase II cardiac rehabilitation.  She denies recurrent anginal symptoms.  She admits to being active.  She is unaware of any palpitations.  When I saw her in October 2017 she had been on aspirin 81 mg and Brilinta 90 twice a day for dual antiplatelet therapy. I discussed the Pegasus trial data and ultimately switched her to 60 mg, Brilinta twice a day, which she has been taking and tolerating.  Since I last saw her in March 2018, she has remained stable and denies any episodes of chest pain or palpitations.  She stays active taking care of her 9 grandchildren and has 5 great-grandchildren.  She has been on amlodipine 5 mg, lisinopril 20 mg, metoprolol 37.5 mg twice a day for hypertension and post MI regimen.  She continues to tolerate  aspirin and low-dose Brilinta at 60.  No grams twice a day for dual antiplatelet therapy.  She has been on Crestor now 20 [place] grams daily for hyperlipidemia with target LDL less than 70.  She states her blood pressure typically at home ranges in the 130 to 160 range.  She presents for evaluation  Past Medical History:  Diagnosis Date  . Anxiety   . Bronchitis 12/2014  . CAD (coronary artery disease), native coronary artery s/p DES to LAD 12/20/2014 01/03/2015  . Hypertension   . Hypothyroidism   . NSTEMI (non-ST elevated myocardial infarction) ([facility]) 12/2014  . Osteopenia    hip  . Prediabetes Hgb A1C 6.0 01/03/2015  . Thyroid disease   . VF (ventricular fibrillation) during cath on 12/20/2014 from occluded mid LAD, s/p defib     Past Surgical History:  Procedure Laterality Date  . APPENDECTOMY    . CARDIAC CATHETERIZATION N/A 12/20/2014   Procedure: Left Heart Cath and Coronary Angiography;  Surgeon: Troy Sine, MD;  Location: [facility];  Service: Cardiovascular;  Laterality: N/A;  . CARDIAC CATHETERIZATION N/A 12/20/2014   Procedure: Coronary Stent Intervention;  Surgeon: Troy Sine, MD;  Location: [facility] [facility];  Service: Cardiovascular;  Laterality: N/A;  . CARDIAC CATHETERIZATION N/A 12/24/2014   Procedure: Left Heart Cath and Coronary Angiography;  Surgeon: Jettie Booze, MD;  Location: [facility];  Service: Cardiovascular;  Laterality: N/A;  . CHOLECYSTECTOMY    . CORONARY ANGIOPLASTY WITH STENT PLACEMENT    .  TONSILLECTOMY      Allergies  Allergen Reactions  . Codeine Nausea And Vomiting  . Crestor [Rosuvastatin] Other (See Comments)    myaglia  . Lipitor [Atorvastatin] Other (See Comments)    myaglia  . Septra [Sulfamethoxazole-Trimethoprim] Nausea And Vomiting  . Simvastatin Other (See Comments)    myaglia  . Welchol [Colesevelam Hcl] Other (See Comments)    myaglia  . Prednisone Palpitations    Current Outpatient  Medications  Medication Sig Dispense Refill  . amLODipine (NORVASC) 5 MG tablet Take 1.5 tablets (7.5 mg total) by mouth daily. 135 tablet 3  . aspirin 81 MG tablet Take 81 mg by mouth daily.    Marland Kitchen levothyroxine (SYNTHROID, LEVOTHROID) 88 MCG tablet Take 88 mcg by mouth daily before breakfast.    . lisinopril (PRINIVIL,ZESTRIL) 20 MG tablet Take 1 tablet (20 mg total) by mouth daily. 90 tablet 3  . metoprolol tartrate (LOPRESSOR) 25 MG tablet TAKE 1 AND 1/2 TABLETS TWO TIMES DAILY 270 tablet 3  . nitroGLYCERIN (NITROSTAT) 0.4 MG SL tablet Place 1 tablet (0.4 mg total) under the tongue every 5 (five) minutes x 3 doses as needed for chest pain. 100 tablet 0  . rosuvastatin (CRESTOR) 20 MG tablet TAKE 1 TABLET DAILY (PLEASE MAKE APPOINTMENT FOR REFILLS) 90 tablet 1  . ticagrelor (BRILINTA) 60 MG TABS tablet Take 1 tablet (60 mg total) by mouth 2 (two) times daily. 180 tablet 3   No current facility-administered medications for this visit.     Social History   Socioeconomic History  . Marital status: Married    Spouse name: Not on file  . Number of children: Not on file  . Years of education: Not on file  . Highest education level: Not on file  Social Needs  . Financial resource strain: Not on file  . Food insecurity - worry: Not on file  . Food insecurity - inability: Not on file  . Transportation needs - medical: Not on file  . Transportation needs - non-medical: Not on file  Occupational History  . Not on file  Tobacco Use  . Smoking status: Never Smoker  . Smokeless tobacco: Never Used  Substance and Sexual Activity  . Alcohol use: No    Alcohol/week: 0.0 oz  . Drug use: No  . Sexual activity: Not on file  Other Topics Concern  . Not on file  Social History Narrative  . Not on file    Family History  Problem Relation Age of Onset  . Hypertension Mother   . Hypertension Father   . Hypertension Sister   . Cancer Sister   . Hypertension Brother   . Cancer Brother   .  CVA Brother   . Transient ischemic attack Sister     ROS General: Negative; No fevers, chills, or night sweats HEENT: Negative; No changes in vision or hearing, sinus congestion, difficulty swallowing Pulmonary: Negative; No cough, wheezing, shortness of breath, hemoptysis Cardiovascular: See HPI: No chest pain, presyncope, syncope, palpatations GI: Negative; No nausea, vomiting, diarrhea, or abdominal pain GU: Negative; No dysuria, hematuria, or difficulty voiding Musculoskeletal: Negative; no myalgias, joint pain, or weakness Hematologic: Negative; no easy bruising, bleeding Endocrine: Negative; no heat/cold intolerance; no diabetes, Neuro: Negative; no changes in balance, headaches Skin: Negative; No rashes or skin lesions Psychiatric: Negative; No behavioral problems, depression Sleep: Negative; No snoring,  daytime sleepiness, hypersomnolence, bruxism, restless legs, hypnogognic hallucinations. Other comprehensive 14 point system review is negative   Physical Exam BP (!) 164/72  Pulse (!) 57   Ht 5' 1" (1.549 m)   Wt 154 lb 12.8 oz (70.2 kg)   BMI 29.25 kg/m    Repeat blood pressure by me was 158/70  Wt Readings from Last 3 Encounters:  12/29/16 154 lb 12.8 oz (70.2 kg)  12/04/16 150 lb (68 kg)  06/16/16 157 lb 12.8 oz (71.6 kg)   General: Alert, oriented, no distress.  Skin: normal turgor, no rashes, warm and dry HEENT: Normocephalic, atraumatic. Pupils equal round and reactive to light; sclera anicteric; extraocular muscles intact;  Nose without nasal septal hypertrophy Mouth/Parynx benign; Mallinpatti scale 2 Neck: No JVD, no carotid bruits; normal carotid upstroke Lungs: clear to ausculatation and percussion; no wheezing or rales Chest wall: without tenderness to palpitation Heart: PMI not displaced, RRR, s1 s2 normal, 1/6 systolic murmur, no diastolic murmur, no rubs, gallops, thrills, or heaves Abdomen: soft, nontender; no hepatosplenomehaly, BS+; abdominal  aorta nontender and not dilated by palpation. Back: no CVA tenderness Pulses 2+ Musculoskeletal: full range of motion, normal strength, no joint deformities Extremities: no clubbing cyanosis or edema, Homan's sign negative  Neurologic: grossly nonfocal; Cranial nerves grossly wnl Psychologic: Normal mood and affect   ECG (independently read by me): Sinus bradycardia 57 bpm.  Normal intervals.  No ectopy.  No ST segment changes.  March 2018 ECG (independently read by me): Normal sinus rhythm at 60 bpm.  Preserved R waves inferiorly with small nondiagnostic Q waves in the 3  11/26/2015 ECG (independently read by me): Sinus bradycardia 59 bpm.  No ECG evidence of her prior MI.  Preserved R waves inferiorly with small Q wave in 3.  ECG (independently read by me):  Sinus bradycardia 59 bpm.  No ectopy.  No ECG criteria for her MI.  LABS:  BMP Latest Ref Rng & Units 04/20/2016 04/26/2015 01/04/2015  Glucose 65 - 99 mg/dL 96 94 100(H)  BUN 7 - 25 mg/dL _0 Creatinine 0.60 - 0.93 mg/dL 0.61 0.71 0.90  Sodium 135 - 146 mmol/L 141 140 140  Potassium 3.5 - 5.3 mmol/L 4.4 4.3 4.0  Chloride 98 - 110 mmol/L 104 102 104  CO2 20 - 31 mmol/L _1 Calcium 8.6 - 10.4 mg/dL 9.7 9.9 9.4     Hepatic Function Latest Ref Rng & Units 04/20/2016 04/26/2015  Total Protein 6.1 - 8.1 g/dL 6.8 7.3  Albumin 3.6 - 5.1 g/dL 4.3 4.1  AST 10 - 35 U/L 20 18  ALT 6 - 29 U/L 14 13  Alk Phosphatase 33 - 130 U/L 67 75  Total Bilirubin 0.2 - 1.2 mg/dL 1.2 1.0    CBC Latest Ref Rng & Units 04/26/2015 01/03/2015 01/03/2015  WBC 4.0 - 10.5 K/uL 6.2 9.9 9.9  Hemoglobin 12.0 - 15.0 g/dL 13.8 13.0 12.6  Hematocrit 36.0 - 46.0 % 40.1 38.7 37.3  Platelets 150 - 400 K/uL 291 462(H) 434(H)   Lab Results  Component Value Date   MCV 85.5 04/26/2015   MCV 88.2 01/03/2015   MCV 88.8 01/03/2015    Lab Results  Component Value Date   TSH 1.84 04/26/2015    BNP    Component Value Date/Time   BNP 552.5 (H)  01/03/2015 1000    ProBNP No results found for: PROBNP   Lipid Panel     Component Value Date/Time   CHOL 123 04/20/2016 1039   TRIG 105 04/20/2016 1039   HDL 44 (L) 04/20/2016 1039   CHOLHDL 2.8 04/20/2016 1039  VLDL 21 04/20/2016 1039   LDLCALC 58 04/20/2016 1039     RADIOLOGY: No results found.  IMPRESSION: 1. Essential hypertension   2. Hyperlipidemia LDL goal <70   3. Hypothyroidism, unspecified type   4. History of NSTEMI (non-ST elevated myocardial infarction) [facility])     ASSESSMENT AND PLAN:  Cathy Reilly is a 76 year old female who suffered a non-ST segment elevation MI in November 2016 and underwent successful stenting of a subtotally occluded LAD extending proximally into the mid vessel. She developed VT/VF requiring defibrillation in the laboratory.  At the time she was also felt to have a possible ulcerated plaque in the distal ramus immediate vessel, which ultimately cleared with anticoagulation therapy.  Since her initial presentation, she has been free of recurrent anginal symptomatology.  She had taken full dose aspirin and Brilinta for 1 year and ultimately was switched to reduced dose Brilinta at 60 mg twice a day, which she has tolerated well.  She remains active but does not exercise regularly.  Her blood pressure today was elevated on her current regimen and I recommended further titration of amlodipine to 7.5 mg daily.  At present, she will continue lisinopril 20, now grams daily, but her blood pressure continues to be elevated further titration of ACE inhibitor addition will be done.  She continues to be on metoprolol 37.5 MG twice a daily.  She is now on rosuvastatin 20 g for hyperlipidemia.  In March 2018.  Total cholesterol is 123, LDL was excellent at 58.  Has normal renal function on ACE inhibitor therapy.  She has hypothyroidism and continues to be on levothyroxine at 88 g daily. I recommended in March 2019 that she undergo a one-year follow-up  laboratory.  I will see her in the office for follow-up evaluation.  In the interim she will monitor her blood pressure.  If her blood pressure does not consistently get below 130 she will contact us for additional medication adjustment.  Time spent: 25 minutes Troy Sine, MD, [facility]  12/29/2016 12:56 PM

## 2016-12-29 NOTE — Patient Instructions (Signed)
Medication Instructions:  INCREASE- Amlodipine 7.5 mg (1 1/2 tablets) daily   If you need a refill on your cardiac medications before your next appointment, please call your pharmacy.  Labwork: CBC, BMP, TSH and fasting Lipids in March 2019 HERE IN OUR OFFICE AT [facility]  Take the provided lab slips for you to take with you to the lab for you blood draw.    You will need to fast. DO NOT EAT OR DRINK PAST MIDNIGHT.    You may go to any [facility] lab that is convenient for you however, we do have a lab in our office that is able to assist you. You do NOT need an appointment for our lab. Once in our office lobby there is a podium to the right of the check-in desk where you are to sign-in and ring a doorbell to alert [place] you are here. Lab is open Monday-Friday from 8:00am to 4:00pm; and is closed for lunch from 12:45p-1:45pm   Testing/Procedures: None Ordered  Follow-Up: Your physician wants you to follow-up in: March 2019.    Thank you for choosing [facility] at [facility]!!

## 2017-03-09 DIAGNOSIS — I2510 Atherosclerotic heart disease of native coronary artery without angina pectoris: Secondary | ICD-10-CM | POA: Diagnosis not present

## 2017-03-09 DIAGNOSIS — E782 Mixed hyperlipidemia: Secondary | ICD-10-CM | POA: Diagnosis not present

## 2017-03-09 DIAGNOSIS — Z1211 Encounter for screening for malignant neoplasm of colon: Secondary | ICD-10-CM | POA: Diagnosis not present

## 2017-03-09 DIAGNOSIS — I10 Essential (primary) hypertension: Secondary | ICD-10-CM | POA: Diagnosis not present

## 2017-03-09 DIAGNOSIS — E039 Hypothyroidism, unspecified: Secondary | ICD-10-CM | POA: Diagnosis not present

## 2017-03-12 ENCOUNTER — Telehealth: Payer: Self-pay | Admitting: Cardiovascular Disease

## 2017-03-12 NOTE — Telephone Encounter (Signed)
Medication samples have been provided to the patient.  Drug name: Brilinta 60mg   Qty: 3 boxes  LOT: [id number]  Exp.Date: 09/2019  Samples left at front desk for patient pick-up. Patient notified.  Fidel Levy 8:59 AM 03/12/2017

## 2017-03-12 NOTE — Telephone Encounter (Signed)
New message    Patient calling the office for samples of medication:   1.  What medication and dosage are you requesting samples for?ticagrelor (BRILINTA) 60 MG TABS tablet   2.  Are you currently out of this medication? yes

## 2017-03-30 ENCOUNTER — Telehealth: Payer: Self-pay | Admitting: Cardiovascular Disease

## 2017-03-30 NOTE — Telephone Encounter (Signed)
New message    Patient calling the office for samples of medication:   1.  What medication and dosage are you requesting samples for? ticagrelor (BRILINTA) 60 MG TABS tablet 2.  Are you currently out of this medication? 2 doses

## 2017-03-30 NOTE — Telephone Encounter (Signed)
Medication samples have been provided to the patient.  Drug name: brilinta 60mg   Qty: 2 boxes  LOT: [id number]  Exp.Date: 11/2018  Samples left at front desk for patient pick-up. Patient notified.  Fidel Levy 10:58 AM 03/30/2017

## 2017-04-08 DIAGNOSIS — R05 Cough: Secondary | ICD-10-CM | POA: Diagnosis not present

## 2017-04-08 DIAGNOSIS — J029 Acute pharyngitis, unspecified: Secondary | ICD-10-CM | POA: Diagnosis not present

## 2017-04-08 DIAGNOSIS — J069 Acute upper respiratory infection, unspecified: Secondary | ICD-10-CM | POA: Diagnosis not present

## 2017-04-28 ENCOUNTER — Ambulatory Visit: Payer: Medicare HMO | Admitting: Cardiovascular Disease

## 2017-05-11 ENCOUNTER — Telehealth: Payer: Self-pay | Admitting: Cardiovascular Disease

## 2017-05-11 NOTE — Telephone Encounter (Signed)
Medication samples have been provided to the patient.  Drug name: Brilinta 60 mg   Qty: 3 boxes LOT: [id number]  Exp.Date: 8/21  Samples left at front desk for patient pick-up. Patient notified.

## 2017-05-11 NOTE — Telephone Encounter (Signed)
New message     Patient calling the office for samples of medication:   1.  What medication and dosage are you requesting samples for?   ticagrelor (BRILINTA) 60 MG TABS tablet Take 1 tablet (60 mg total) by mouth 2 (two) times daily.        2.  Are you currently out of this medication?  Has enough to get to the end of the week

## 2017-06-07 DIAGNOSIS — I10 Essential (primary) hypertension: Secondary | ICD-10-CM | POA: Diagnosis not present

## 2017-06-07 DIAGNOSIS — Z0100 Encounter for examination of eyes and vision without abnormal findings: Secondary | ICD-10-CM | POA: Diagnosis not present

## 2017-06-11 ENCOUNTER — Telehealth: Payer: Self-pay | Admitting: Cardiovascular Disease

## 2017-06-11 NOTE — Telephone Encounter (Signed)
Patient calling the office for samples of medication:   1.  What medication and dosage are you requesting samples for? ticagrelor (BRILINTA) 60 MG TABS tablet Take 1 tablet (60 mg total) by mouth 2 (two) times daily.   2.  Are you currently out of this medication? Yes, [facility] is out of the prescription

## 2017-06-14 NOTE — Telephone Encounter (Signed)
Follow up    Patient calling the office for samples of medication:   1.  What medication and dosage are you requesting samples for? ticagrelor (BRILINTA) 60 MG TABS tablet  2.  Are you currently out of this medication? yes

## 2017-06-14 NOTE — Telephone Encounter (Signed)
Brilinta 60 mg #2 Lot # [id number] exp 8/21 left at front desk for pick up. Left message with husband, ok per [facility]

## 2017-06-15 DIAGNOSIS — E785 Hyperlipidemia, unspecified: Secondary | ICD-10-CM | POA: Diagnosis not present

## 2017-06-15 DIAGNOSIS — E039 Hypothyroidism, unspecified: Secondary | ICD-10-CM | POA: Diagnosis not present

## 2017-06-15 DIAGNOSIS — I10 Essential (primary) hypertension: Secondary | ICD-10-CM | POA: Diagnosis not present

## 2017-06-15 LAB — CBC
HEMATOCRIT: 38.1 % (ref 34.0–46.6)
Hemoglobin: 13.1 g/dL (ref 11.1–15.9)
MCH: 29.8 pg (ref 26.6–33.0)
MCHC: 34.4 g/dL (ref 31.5–35.7)
MCV: 87 fL (ref 79–97)
Platelets: 311 10*3/uL (ref 150–379)
RBC: 4.4 x10E6/uL (ref 3.77–5.28)
RDW: 13.5 % (ref 12.3–15.4)
WBC: 6 10*3/uL (ref 3.4–10.8)

## 2017-06-15 LAB — BASIC METABOLIC PANEL
BUN/Creatinine Ratio: 13 (ref 12–28)
BUN: 8 mg/dL (ref 8–27)
CO2: 27 mmol/L (ref 20–29)
Calcium: 9.8 mg/dL (ref 8.7–10.3)
Chloride: 101 mmol/L (ref 96–106)
Creatinine, Ser: 0.63 mg/dL (ref 0.57–1.00)
GFR, EST AFRICAN AMERICAN: 101 mL/min/{1.73_m2} (ref >59)
GFR, EST NON AFRICAN AMERICAN: 88 mL/min/{1.73_m2} (ref >59)
Glucose: 98 mg/dL (ref 65–99)
Potassium: 4.4 mmol/L (ref 3.5–5.2)
SODIUM: 141 mmol/L (ref 134–144)

## 2017-06-15 LAB — LIPID PANEL
CHOL/HDL RATIO: 2.6 ratio (ref 0.0–4.4)
Cholesterol, Total: 118 mg/dL (ref 100–199)
HDL: 45 mg/dL (ref >39)
LDL CALC: 57 mg/dL (ref 0–99)
Triglycerides: 79 mg/dL (ref 0–149)
VLDL Cholesterol Cal: 16 mg/dL (ref 5–40)

## 2017-06-15 LAB — TSH: TSH: 3.61 u[IU]/mL (ref 0.450–4.500)

## 2017-06-16 ENCOUNTER — Encounter: Payer: Self-pay | Admitting: Cardiovascular Disease

## 2017-06-18 ENCOUNTER — Ambulatory Visit: Payer: Medicare HMO | Admitting: Cardiovascular Disease

## 2017-06-18 ENCOUNTER — Encounter: Payer: Self-pay | Admitting: Cardiovascular Disease

## 2017-06-18 ENCOUNTER — Encounter

## 2017-06-18 VITALS — BP 144/62 | HR 65 | Ht 61.0 in | Wt 152.4 lb

## 2017-06-18 DIAGNOSIS — E039 Hypothyroidism, unspecified: Secondary | ICD-10-CM

## 2017-06-18 DIAGNOSIS — I10 Essential (primary) hypertension: Secondary | ICD-10-CM

## 2017-06-18 DIAGNOSIS — I214 Non-ST elevation (NSTEMI) myocardial infarction: Secondary | ICD-10-CM

## 2017-06-18 DIAGNOSIS — E785 Hyperlipidemia, unspecified: Secondary | ICD-10-CM | POA: Diagnosis not present

## 2017-06-18 DIAGNOSIS — I2510 Atherosclerotic heart disease of native coronary artery without angina pectoris: Secondary | ICD-10-CM | POA: Diagnosis not present

## 2017-06-18 MED ORDER — AMLODIPINE BESYLATE 10 MG PO TABS: 10.0000 mg | ORAL_TABLET | Freq: Every day | ORAL | 3 refills | Status: DC

## 2017-06-18 NOTE — Patient Instructions (Signed)
Medication Instructions:  INCREASE amlodipine to 10 mg daily  Follow-Up: Your physician wants you to follow-up in: 6 months with Dr. Kelly.  You will receive a reminder letter in the mail two months in advance. If you don't receive a letter, please call our office to schedule the follow-up appointment.   Any Other Special Instructions Will Be Listed Below (If Applicable).     If you need a refill on your cardiac medications before your next appointment, please call your pharmacy.   

## 2017-06-18 NOTE — Progress Notes (Signed)
Patient ID: Cathy Reilly, female   DOB: 11-15-1940, 77 y.o.   MRN: [id number]    PCP: Dr. Carol Ada  HPI: Cathy Reilly is a 77 y.o. female who presents to the office today for a 6 month follow up cardiology evaluation.   [name] has a history of hypertension and hypothyroidism.  She was admitted in the early morning on 12/19/2014 with a symptom complex suggestive of unstable angina.  She ruled in for non-ST segment elevation MI with initial troponin of 5.  She was referred for cardiac catheterization.  In the catheterization laboratory she developed an episode of VT/VF and required defibrillation.  She underwent successful intervention to a subtotally/totally occluded LAD which had diffuse proximal to mid disease and was successfully stented with a 3033 mm Xience Alpine stent.  She was also felt to have a possible spontaneous dissection with a 90% distal intermediate stenosis ulcerated plaque.  She was maintained on anticoagulation and several days later was brought back to the catheterization laboratory.  Her LAD stent was widely patent and there was resolution of her prior intermediate stenosis.  Subsequent, she has remained stable.  She is participating in phase II cardiac rehabilitation.  She denies recurrent anginal symptoms.  She admits to being active.  She is unaware of any palpitations.  When I saw her in October 2017 she had been on aspirin 81 mg and Brilinta 90 twice a day for dual antiplatelet therapy. I discussed the Pegasus trial data and ultimately switched her to 60 mg, Brilinta twice a day, which she has been taking and tolerating.  When I last saw her in November 2018 she was stable and denied any episodes of chest pain or palpitations.  She stays active taking care of her 9 grandchildren and has 5 great-grandchildren.  She has been on amlodipine 5 mg, lisinopril 20 mg, metoprolol 37.5 mg twice a day for hypertension and post MI regimen.  She was on aspirin and low-dose  Brilinta at 60 mg twice a day for dual antiplatelet therapy.  She has been on Crestor mg daily for hyperlipidemia with target LDL less than 70.  She states her blood pressure typically at home ranges in the 130 to 160 range.    Past 6 months, she denies any recurrent anginal type symptoms.  She has noticed that her blood pressure at home really runs in the 140 - 150 range.  He had lab work last week.  Cholesterol was 118 LDL 59.  She has been on amlodipine 7.5 mg , metoprolol 37.5 mg twice a day and lisinopril 20 mg for hypertension.  She has hypothyroidism on levothyroxine 88 mcg.  Not had any bleeding on aspirin and Brilinta.  She presents for reevaluation.  Past Medical History:  Diagnosis Date  . Anxiety   . Bronchitis 12/2014  . CAD (coronary artery disease), native coronary artery s/p DES to LAD 12/20/2014 01/03/2015  . Hypertension   . Hypothyroidism   . NSTEMI (non-ST elevated myocardial infarction) ([place]) 12/2014  . Osteopenia    hip  . Prediabetes Hgb A1C 6.0 01/03/2015  . Thyroid disease   . VF (ventricular fibrillation) during cath on 12/20/2014 from occluded mid LAD, s/p defib     Past Surgical History:  Procedure Laterality Date  . APPENDECTOMY    . CARDIAC CATHETERIZATION N/A 12/20/2014   Procedure: Left Heart Cath and Coronary Angiography;  Surgeon: Troy Sine, MD;  Location: [facility];  Service: Cardiovascular;  Laterality: N/A;  .  CARDIAC CATHETERIZATION N/A 12/20/2014   Procedure: Coronary Stent Intervention;  Surgeon: Troy Sine, MD;  Location: [facility];  Service: Cardiovascular;  Laterality: N/A;  . CARDIAC CATHETERIZATION N/A 12/24/2014   Procedure: Left Heart Cath and Coronary Angiography;  Surgeon: Jettie Booze, MD;  Location: [facility];  Service: Cardiovascular;  Laterality: N/A;  . CHOLECYSTECTOMY    . CORONARY ANGIOPLASTY WITH STENT PLACEMENT    . TONSILLECTOMY      Allergies  Allergen Reactions  . Codeine Nausea  And Vomiting  . Crestor [Rosuvastatin] Other (See Comments)    myaglia  . Lipitor [Atorvastatin] Other (See Comments)    myaglia  . Septra [Sulfamethoxazole-Trimethoprim] Nausea And Vomiting  . Simvastatin Other (See Comments)    myaglia  . Welchol [Colesevelam Hcl] Other (See Comments)    myaglia  . Prednisone Palpitations    Current Outpatient Medications  Medication Sig Dispense Refill  . amLODipine (NORVASC) 10 MG tablet Take 1 tablet (10 mg total) by mouth daily. 90 tablet 3  . aspirin 81 MG tablet Take 81 mg by mouth daily.    Marland Kitchen levothyroxine (SYNTHROID, LEVOTHROID) 88 MCG tablet Take 88 mcg by mouth daily before breakfast.    . metoprolol tartrate (LOPRESSOR) 25 MG tablet TAKE 1 AND 1/2 TABLETS TWO TIMES DAILY 270 tablet 3  . nitroGLYCERIN (NITROSTAT) 0.4 MG SL tablet Place 1 tablet (0.4 mg total) under the tongue every 5 (five) minutes x 3 doses as needed for chest pain. 100 tablet 0  . rosuvastatin (CRESTOR) 20 MG tablet TAKE 1 TABLET DAILY (PLEASE MAKE APPOINTMENT FOR REFILLS) 90 tablet 1  . ticagrelor (BRILINTA) 60 MG TABS tablet Take 1 tablet (60 mg total) by mouth 2 (two) times daily. 180 tablet 3  . lisinopril (PRINIVIL,ZESTRIL) 20 MG tablet Take 1 tablet (20 mg total) by mouth daily. 90 tablet 3   No current facility-administered medications for this visit.     Social History   Socioeconomic History  . Marital status: Married    Spouse name: Not on file  . Number of children: Not on file  . Years of education: Not on file  . Highest education level: Not on file  Occupational History  . Not on file  Social Needs  . Financial resource strain: Not on file  . Food insecurity:    Worry: Not on file    Inability: Not on file  . Transportation needs:    Medical: Not on file    Non-medical: Not on file  Tobacco Use  . Smoking status: Never Smoker  . Smokeless tobacco: Never Used  Substance and Sexual Activity  . Alcohol use: No    Alcohol/week: 0.0 oz  . Drug  use: No  . Sexual activity: Not on file  Lifestyle  . Physical activity:    Days per week: Not on file    Minutes per session: Not on file  . Stress: Not on file  Relationships  . Social connections:    Talks on phone: Not on file    Gets together: Not on file    Attends religious service: Not on file    Active member of club or organization: Not on file    Attends meetings of clubs or organizations: Not on file    Relationship status: Not on file  . Intimate partner violence:    Fear of current or ex partner: Not on file    Emotionally abused: Not on file  Physically abused: Not on file    Forced sexual activity: Not on file  Other Topics Concern  . Not on file  Social History Narrative  . Not on file    Family History  Problem Relation Age of Onset  . Hypertension Mother   . Hypertension Father   . Hypertension Sister   . Cancer Sister   . Hypertension Brother   . Cancer Brother   . CVA Brother   . Transient ischemic attack Sister     ROS General: Negative; No fevers, chills, or night sweats HEENT: Negative; No changes in vision or hearing, sinus congestion, difficulty swallowing Pulmonary: Negative; No cough, wheezing, shortness of breath, hemoptysis Cardiovascular: See HPI: No chest pain, presyncope, syncope, palpatations GI: Negative; No nausea, vomiting, diarrhea, or abdominal pain GU: Negative; No dysuria, hematuria, or difficulty voiding Musculoskeletal: Negative; no myalgias, joint pain, or weakness Hematologic: Negative; no easy bruising, bleeding Endocrine: Negative; no heat/cold intolerance; no diabetes, Neuro: Negative; no changes in balance, headaches Skin: Negative; No rashes or skin lesions Psychiatric: Negative; No behavioral problems, depression Sleep: Negative; No snoring,  daytime sleepiness, hypersomnolence, bruxism, restless legs, hypnogognic hallucinations. Other comprehensive 14 point system review is negative   Physical Exam BP (!)  144/62 (BP Location: Left Arm)   Pulse 65   Ht '5\' 1"'$  (1.549 m)   Wt 152 lb 6.4 oz (69.1 kg)   BMI 28.80 kg/m    Repeat  blood pressure by me was 150/72  Wt Readings from Last 3 Encounters:  06/18/17 152 lb 6.4 oz (69.1 kg)  12/29/16 154 lb 12.8 oz (70.2 kg)  12/04/16 150 lb (68 kg)   General: Alert, oriented, no distress.  Skin: normal turgor, no rashes, warm and dry HEENT: Normocephalic, atraumatic. Pupils equal round and reactive to light; sclera anicteric; extraocular muscles intact;  Nose without nasal septal hypertrophy Mouth/Parynx benign; Mallinpatti scale 2 Neck: No JVD, no carotid bruits; normal carotid upstroke Lungs: clear to ausculatation and percussion; no wheezing or rales Chest wall: without tenderness to palpitation Heart: PMI not displaced, RRR, s1 s2 normal, 1/6 systolic murmur, no diastolic murmur, no rubs, gallops, thrills, or heaves Abdomen: soft, nontender; no hepatosplenomehaly, BS+; abdominal aorta nontender and not dilated by palpation. Back: no CVA tenderness Pulses 2+ Musculoskeletal: full range of motion, normal strength, no joint deformities Extremities: no clubbing cyanosis or edema, Homan's sign negative  Neurologic: grossly nonfocal; Cranial nerves grossly wnl Psychologic: Normal mood and affect   ECG (independently read by me): Normal sinus rhythm at 65 bpm.  No ectopy.  Normal intervals.  November 2018 ECG (independently read by me): Sinus bradycardia 57 bpm.  Normal intervals.  No ectopy.  No ST segment changes.  March 2018 ECG (independently read by me): Normal sinus rhythm at 60 bpm.  Preserved R waves inferiorly with small nondiagnostic Q waves in the 3  11/26/2015 ECG (independently read by me): Sinus bradycardia 59 bpm.  No ECG evidence of her prior MI.  Preserved R waves inferiorly with small Q wave in 3.  ECG (independently read by me):  Sinus bradycardia 59 bpm.  No ectopy.  No ECG criteria for her MI.  LABS:  BMP Latest Ref Rng &  Units 06/15/2017 04/20/2016 04/26/2015  Glucose 65 - 99 mg/dL 98 96 94  BUN 8 - 27 mg/dL '8 9 12  '$ Creatinine 0.57 - 1.00 mg/dL 0.63 0.61 0.71  BUN/Creat Ratio 12 - 28 13 - -  Sodium 134 - 144 mmol/L 141 141 140  Potassium 3.5 - 5.2 mmol/L 4.4 4.4 4.3  Chloride 96 - 106 mmol/L 101 104 102  CO2 20 - 29 mmol/L '27 29 29  '$ Calcium 8.7 - 10.3 mg/dL 9.8 9.7 9.9     Hepatic Function Latest Ref Rng & Units 04/20/2016 04/26/2015  Total Protein 6.1 - 8.1 g/dL 6.8 7.3  Albumin 3.6 - 5.1 g/dL 4.3 4.1  AST 10 - 35 U/L 20 18  ALT 6 - 29 U/L 14 13  Alk Phosphatase 33 - 130 U/L 67 75  Total Bilirubin 0.2 - 1.2 mg/dL 1.2 1.0    CBC Latest Ref Rng & Units 06/15/2017 04/26/2015 01/03/2015  WBC 3.4 - 10.8 x10E3/uL 6.0 6.2 9.9  Hemoglobin 11.1 - 15.9 g/dL 13.1 13.8 13.0  Hematocrit 34.0 - 46.6 % 38.1 40.1 38.7  Platelets 150 - 379 x10E3/uL 311 291 462(H)   Lab Results  Component Value Date   MCV 87 06/15/2017   MCV 85.5 04/26/2015   MCV 88.2 01/03/2015    Lab Results  Component Value Date   TSH 3.610 06/15/2017    BNP    Component Value Date/Time   BNP 552.5 (H) 01/03/2015 1000    ProBNP No results found for: PROBNP   Lipid Panel     Component Value Date/Time   CHOL 118 06/15/2017 0933   TRIG 79 06/15/2017 0933   HDL 45 06/15/2017 0933   CHOLHDL 2.6 06/15/2017 0933   CHOLHDL 2.8 04/20/2016 1039   VLDL 21 04/20/2016 1039   LDLCALC 57 06/15/2017 0933     RADIOLOGY: No results found.  IMPRESSION: 1. Essential hypertension   2. Hyperlipidemia LDL goal <70   3. CAD in native artery   4. History of NSTEMI (non-ST elevated myocardial infarction) ([place])   5. Hypothyroidism, unspecified type     ASSESSMENT AND PLAN:  Ms. Kathleene Bergemann is a 77 year-old female who suffered a non-ST segment elevation MI in November 2016 and underwent successful stenting of a subtotally occluded LAD extending proximally into the mid vessel. She developed VT/VF requiring defibrillation in the laboratory.   At the time she was also felt to have a possible ulcerated plaque in the distal ramus immediate vessel, which ultimately cleared with anticoagulation therapy.  Since her initial presentation, she has been free of recurrent anginal symptomatology.  She took aspirin and Brilinta 90 mg bid for 1 year and ultimately was switched to reduced dose Brilinta at 60 mg twice a day, which she has tolerated well.  She remains active but does not exercise regularly.  Her blood pressure today continues to be elevated on repeat by me was 150/72.  I discussed with her the new hypertensive guidelines.  I have recommended further titration of amlodipine up to 10 mg daily.  She continues to be on aspirin and Brilinta at 60 mg twice daily dosing without bleeding.  She is not having recurrent anginal symptoms on her medical regimen.  She continues to be on rosuvastatin 20 mg for hyperlipidemia.  Most recent lipid studies from Jun 15, 2017 were excellent with an LDL at 57.  She had normal renal function.  Continues to be on levothyroxine for thyroidism and TSH is normal at 3.6 on current therapy.  She will monitor her blood pressure at home.  I will see her in 6 months for reevaluation.  Time spent: 25 minutes Troy Sine, MD, [facility]  06/20/2017 1:29 PM

## 2017-06-20 ENCOUNTER — Encounter: Payer: Self-pay | Admitting: Cardiovascular Disease

## 2017-06-25 ENCOUNTER — Other Ambulatory Visit: Payer: Self-pay | Admitting: Cardiovascular Disease

## 2017-06-25 NOTE — Telephone Encounter (Signed)
Rx request sent to pharmacy.  

## 2017-07-27 ENCOUNTER — Telehealth: Payer: Self-pay | Admitting: Cardiovascular Disease

## 2017-07-27 NOTE — Telephone Encounter (Signed)
Spoke with pt and advised that samples are available for pick up at the front desk.  Brilinta 60 mg Qty: 2 boxes Lot# [id number] expires 8/21

## 2017-07-27 NOTE — Telephone Encounter (Signed)
New Message    Patient calling the office for samples of medication:   1.  What medication and dosage are you requesting samples for? ticagrelor (BRILINTA) 60 MG TABS tablet  2.  Are you currently out of this medication? 3 days

## 2017-08-03 ENCOUNTER — Other Ambulatory Visit: Payer: Self-pay | Admitting: Cardiovascular Disease

## 2017-11-15 ENCOUNTER — Telehealth: Payer: Self-pay | Admitting: Cardiovascular Disease

## 2017-11-15 NOTE — Telephone Encounter (Signed)
Spoke with pt, her ankles are normal in the morning when she gets up but then swell after she is on her feet. She feels the swelling is getting worse and is now getting uncomfortable. Her bp is running 139-140/60-70. shye would like to change to something different for her bp. Will forward to dr Claiborne Billings to review and advise,

## 2017-11-15 NOTE — Telephone Encounter (Signed)
New message:       Pt c/o medication issue:  1. Name of Medication: amLODipine (NORVASC) 10 MG tablet  2. How are you currently taking this medication (dosage and times per day)Take 1 tablet (10 mg total) by mouth daily.?  3. Are you having a reaction (difficulty breathing--STAT)? No  4. What is your medication issue? Pt states is there another medication she could switch to due to this medication causing swelling in her ankles.

## 2017-11-15 NOTE — Telephone Encounter (Signed)
With increasing leg swelling, recommend decreasing amlodipine to 5 mg.  Add HCTZ 12.5 mg to her medical regimen.  Follow-up blood pressure readings.

## 2017-11-16 MED ORDER — HYDROCHLOROTHIAZIDE 12.5 MG PO CAPS: 12.5000 mg | ORAL_CAPSULE | Freq: Every day | ORAL | 3 refills | Status: DC

## 2017-11-16 MED ORDER — AMLODIPINE BESYLATE 5 MG PO TABS: 5.0000 mg | ORAL_TABLET | Freq: Every day | ORAL | Status: DC

## 2017-11-16 NOTE — Telephone Encounter (Signed)
Spoke with pt, aware of dr [facility] recommendation. New script sent to the pharmacy.

## 2017-12-06 ENCOUNTER — Telehealth: Payer: Self-pay | Admitting: Cardiovascular Disease

## 2017-12-06 NOTE — Telephone Encounter (Signed)
Medication samples have been provided to the patient.  Drug name: Brilinta 60 mg  Qty: 28 tablets    LOT: [id number]  Exp.Date: 04/22  Samples left at front desk for patient pick-up. Patient notified.

## 2017-12-06 NOTE — Telephone Encounter (Signed)
Patient calling the office for samples of medication:   1.  What medication and dosage are you requesting samples for? ticagrelor (BRILINTA) 60 MG TABS tablet  2.  Are you currently out of this medication?  No   

## 2017-12-24 ENCOUNTER — Telehealth: Payer: Self-pay | Admitting: Cardiovascular Disease

## 2017-12-24 NOTE — Telephone Encounter (Signed)
New Message         Patient calling the office for samples of medication:   1.  What medication and dosage are you requesting samples for? Brilinta 60 mg  2.  Are you currently out of this medication? Enough of days

## 2017-12-24 NOTE — Telephone Encounter (Signed)
Returned call to patient Brilinta 60 mg samples left at [employer] office front desk.

## 2017-12-25 ENCOUNTER — Other Ambulatory Visit: Payer: Self-pay | Admitting: Cardiovascular Disease

## 2018-01-17 ENCOUNTER — Telehealth: Payer: Self-pay | Admitting: Cardiovascular Disease

## 2018-01-17 NOTE — Telephone Encounter (Signed)
New Message    Patient calling the office for samples of medication:   1.  What medication and dosage are you requesting samples for? Brilinta   2.  Are you currently out of this medication? Have one remaining

## 2018-01-17 NOTE — Telephone Encounter (Signed)
Pt advise samples are available for pick up at the front desk.  Name: Cathy Reilly Form: 2 boxes Lot # [id number] Expires: 4/22

## 2018-03-01 ENCOUNTER — Telehealth: Payer: Self-pay | Admitting: Cardiovascular Disease

## 2018-03-01 NOTE — Telephone Encounter (Signed)
Medication samples have been provided to the patient.  Drug name: brilinta 60mg   Qty: 3 boxes  LOT: [id number]  Exp.Date: 05/2020  Samples left at front desk for patient pick-up. Patient notified via VM.  Sheral Apley M 10:15 AM 03/01/2018

## 2018-03-01 NOTE — Telephone Encounter (Signed)
° ° °  Patient calling the office for samples of medication:   1.  What medication and dosage are you requesting samples for?BRILINTA 60 MG TABS tablet  2.  Are you currently out of this medication? no

## 2018-03-18 ENCOUNTER — Ambulatory Visit: Payer: Medicare HMO | Admitting: Cardiovascular Disease

## 2018-03-18 ENCOUNTER — Encounter: Payer: Self-pay | Admitting: Cardiovascular Disease

## 2018-03-18 VITALS — BP 128/68 | HR 61 | Ht 61.0 in | Wt 157.6 lb

## 2018-03-18 DIAGNOSIS — E785 Hyperlipidemia, unspecified: Secondary | ICD-10-CM | POA: Diagnosis not present

## 2018-03-18 DIAGNOSIS — I10 Essential (primary) hypertension: Secondary | ICD-10-CM

## 2018-03-18 DIAGNOSIS — I214 Non-ST elevation (NSTEMI) myocardial infarction: Secondary | ICD-10-CM | POA: Diagnosis not present

## 2018-03-18 DIAGNOSIS — I2510 Atherosclerotic heart disease of native coronary artery without angina pectoris: Secondary | ICD-10-CM

## 2018-03-18 DIAGNOSIS — E039 Hypothyroidism, unspecified: Secondary | ICD-10-CM

## 2018-03-18 LAB — COMPREHENSIVE METABOLIC PANEL
ALT: 15 IU/L (ref 0–32)
AST: 20 IU/L (ref 0–40)
Albumin/Globulin Ratio: 1.8 (ref 1.2–2.2)
Albumin: 4.5 g/dL (ref 3.7–4.7)
Alkaline Phosphatase: 71 IU/L (ref 39–117)
BUN/Creatinine Ratio: 15 (ref 12–28)
BUN: 11 mg/dL (ref 8–27)
Bilirubin Total: 1.6 mg/dL — ABNORMAL HIGH (ref 0.0–1.2)
CO2: 25 mmol/L (ref 20–29)
Calcium: 9.9 mg/dL (ref 8.7–10.3)
Chloride: 100 mmol/L (ref 96–106)
Creatinine, Ser: 0.72 mg/dL (ref 0.57–1.00)
GFR calc Af Amer: 94 mL/min/{1.73_m2} (ref >59)
GFR calc non Af Amer: 82 mL/min/{1.73_m2} (ref >59)
Globulin, Total: 2.5 g/dL (ref 1.5–4.5)
Glucose: 100 mg/dL — ABNORMAL HIGH (ref 65–99)
Potassium: 4.2 mmol/L (ref 3.5–5.2)
Sodium: 142 mmol/L (ref 134–144)
Total Protein: 7 g/dL (ref 6.0–8.5)

## 2018-03-18 LAB — LIPID PANEL
Chol/HDL Ratio: 2.4 ratio (ref 0.0–4.4)
Cholesterol, Total: 111 mg/dL (ref 100–199)
HDL: 46 mg/dL (ref >39)
LDL Calculated: 49 mg/dL (ref 0–99)
Triglycerides: 81 mg/dL (ref 0–149)
VLDL CHOLESTEROL CAL: 16 mg/dL (ref 5–40)

## 2018-03-18 LAB — TSH: TSH: 1.49 u[IU]/mL (ref 0.450–4.500)

## 2018-03-18 LAB — CBC
Hematocrit: 36.5 % (ref 34.0–46.6)
Hemoglobin: 12.7 g/dL (ref 11.1–15.9)
MCH: 30 pg (ref 26.6–33.0)
MCHC: 34.8 g/dL (ref 31.5–35.7)
MCV: 86 fL (ref 79–97)
Platelets: 295 10*3/uL (ref 150–450)
RBC: 4.23 x10E6/uL (ref 3.77–5.28)
RDW: 12.9 % (ref 11.7–15.4)
WBC: 9.4 10*3/uL (ref 3.4–10.8)

## 2018-03-18 MED ORDER — LISINOPRIL 20 MG PO TABS: 30.0000 mg | ORAL_TABLET | Freq: Every day | ORAL | 3 refills | Status: DC

## 2018-03-18 MED ORDER — AMLODIPINE BESYLATE 5 MG PO TABS: 5.0000 mg | ORAL_TABLET | Freq: Every day | ORAL | 3 refills | Status: DC

## 2018-03-18 NOTE — Progress Notes (Signed)
Patient ID: Cathy Reilly, female   DOB: Jul 14, 1940, 78 y.o.   MRN: [id number]    PCP: Dr. Carol Ada  HPI: Cathy Reilly is a 78 y.o. female who presents to the office today for a 9 month follow up cardiology evaluation.   Cathy Reilly has a history of hypertension and hypothyroidism.  She was admitted in the early morning on 12/19/2014 with a symptom complex suggestive of unstable angina.  She ruled in for non-ST segment elevation MI with initial troponin of 5.  She was referred for cardiac catheterization.  In the catheterization laboratory she developed an episode of VT/VF and required defibrillation.  She underwent successful intervention to a subtotally/totally occluded LAD which had diffuse proximal to mid disease and was successfully stented with a 3033 mm Xience Alpine stent.  She was also felt to have a possible spontaneous dissection with a 90% distal intermediate stenosis ulcerated plaque.  She was maintained on anticoagulation and several days later was brought back to the catheterization laboratory.  Her LAD stent was widely patent and there was resolution of her prior intermediate stenosis.  Subsequent, she has remained stable.  She is participating in phase II cardiac rehabilitation.  She denies recurrent anginal symptoms.  She admits to being active.  She is unaware of any palpitations.  When I saw her in October 2017 she had been on aspirin 81 mg and Brilinta 90 twice a day for dual antiplatelet therapy. I discussed the Pegasus trial data and ultimately switched her to 60 mg, Brilinta twice a day, which she has been taking and tolerating.  When I last saw her in November 2018 she was stable and denied any episodes of chest pain or palpitations.  She stays active taking care of her 9 grandchildren and has 5 great-grandchildren.  She has been on amlodipine 5 mg, lisinopril 20 mg, metoprolol 37.5 mg twice a day for hypertension and post MI regimen.  She was on aspirin and low-dose  Brilinta at 60 mg twice a day for dual antiplatelet therapy.  She has been on Crestor mg daily for hyperlipidemia with target LDL less than 70.  She states her blood pressure typically at home ranges in the 130 to 160 range.    I last saw her in May 2019 at which time she denied any recurrent anginal symptomatology.   Her blood pressure at home was running s in the 140 - 150 range.    Cholesterol was 118 LDL 59.  She has been on amlodipine 7.5 mg , metoprolol 37.5 mg twice a day and lisinopril 20 mg for hypertension.  She has hypothyroidism on levothyroxine 88 mcg.  Not had any bleeding on aspirin and Brilinta.  I discussed with her new hypertensive guidelines and recommended further titration of amlodipine up to 10 mg daily.  Since I last saw her, she developed swelling on amlodipine bleeding.  Reduction of dose back to 5 mg and institution of hydrochlorothiazide 12.5 mg.  This resolved her swelling.  She denies any anginal symptoms.  She states her blood pressure typically runs at home 503 and 546 systolically denies palpitations PND orthopnea.  She presents for evaluation.  Past Medical History:  Diagnosis Date  . Anxiety   . Bronchitis 12/2014  . CAD (coronary artery disease), native coronary artery s/p DES to LAD 12/20/2014 01/03/2015  . Hypertension   . Hypothyroidism   . NSTEMI (non-ST elevated myocardial infarction) ([place]) 12/2014  . Osteopenia    hip  . Prediabetes  Hgb A1C 6.0 01/03/2015  . Thyroid disease   . VF (ventricular fibrillation) during cath on 12/20/2014 from occluded mid LAD, s/p defib     Past Surgical History:  Procedure Laterality Date  . APPENDECTOMY    . CARDIAC CATHETERIZATION N/A 12/20/2014   Procedure: Left Heart Cath and Coronary Angiography;  Surgeon: Troy Sine, MD;  Location: [facility];  Service: Cardiovascular;  Laterality: N/A;  . CARDIAC CATHETERIZATION N/A 12/20/2014   Procedure: Coronary Stent Intervention;  Surgeon: Troy Sine, MD;   Location: [place] [facility] [facility];  Service: Cardiovascular;  Laterality: N/A;  . CARDIAC CATHETERIZATION N/A 12/24/2014   Procedure: Left Heart Cath and Coronary Angiography;  Surgeon: Jettie Booze, MD;  Location: [place] [facility];  Service: Cardiovascular;  Laterality: N/A;  . CHOLECYSTECTOMY    . CORONARY ANGIOPLASTY WITH STENT PLACEMENT    . TONSILLECTOMY      Allergies  Allergen Reactions  . Codeine Nausea And Vomiting  . Crestor [Rosuvastatin] Other (See Comments)    myaglia  . Lipitor [Atorvastatin] Other (See Comments)    myaglia  . Septra [Sulfamethoxazole-Trimethoprim] Nausea And Vomiting  . Simvastatin Other (See Comments)    myaglia  . Welchol [Colesevelam Hcl] Other (See Comments)    myaglia  . Prednisone Palpitations    Current Outpatient Medications  Medication Sig Dispense Refill  . amLODipine (NORVASC) 5 MG tablet Take 1 tablet (5 mg total) by mouth daily. 90 tablet 3  . amLODipine (NORVASC) 5 MG tablet Take 1 tablet (5 mg total) by mouth daily. 90 tablet 3  . aspirin 81 MG tablet Take 81 mg by mouth daily.    Marland Kitchen BRILINTA 60 MG TABS tablet TAKE 1 TABLET BY MOUTH TWICE DAILY 60 tablet 10  . ELDERBERRY PO Take 50 mg by mouth 2 (two) times daily.    . hydrochlorothiazide (MICROZIDE) 12.5 MG capsule Take 1 capsule (12.5 mg total) by mouth daily. 90 capsule 3  . levothyroxine (SYNTHROID, LEVOTHROID) 88 MCG tablet Take 88 mcg by mouth daily before breakfast.    . lisinopril (PRINIVIL,ZESTRIL) 20 MG tablet Take 1.5 tablets (30 mg total) by mouth daily. 90 tablet 3  . metoprolol tartrate (LOPRESSOR) 25 MG tablet TAKE 1 AND 1/2 TABLETS TWO TIMES DAILY 270 tablet 3  . nitroGLYCERIN (NITROSTAT) 0.4 MG SL tablet Place 1 tablet (0.4 mg total) under the tongue every 5 (five) minutes x 3 doses as needed for chest pain. 100 tablet 0  . rosuvastatin (CRESTOR) 20 MG tablet TAKE 1 TABLET DAILY (PLEASE MAKE APPOINTMENT FOR REFILLS) 90 tablet 0  . ticagrelor (BRILINTA) 60 MG TABS  tablet Take 1 tablet (60 mg total) by mouth 2 (two) times daily. 180 tablet 3   No current facility-administered medications for this visit.     Social History   Socioeconomic History  . Marital status: Married    Spouse name: Not on file  . Number of children: Not on file  . Years of education: Not on file  . Highest education level: Not on file  Occupational History  . Not on file  Social Needs  . Financial resource strain: Not on file  . Food insecurity:    Worry: Not on file    Inability: Not on file  . Transportation needs:    Medical: Not on file    Non-medical: Not on file  Tobacco Use  . Smoking status: Never Smoker  . Smokeless tobacco: Never Used  Substance and Sexual Activity  .  Alcohol use: No    Alcohol/week: 0.0 standard drinks  . Drug use: No  . Sexual activity: Not on file  Lifestyle  . Physical activity:    Days per week: Not on file    Minutes per session: Not on file  . Stress: Not on file  Relationships  . Social connections:    Talks on phone: Not on file    Gets together: Not on file    Attends religious service: Not on file    Active member of club or organization: Not on file    Attends meetings of clubs or organizations: Not on file    Relationship status: Not on file  . Intimate partner violence:    Fear of current or ex partner: Not on file    Emotionally abused: Not on file    Physically abused: Not on file    Forced sexual activity: Not on file  Other Topics Concern  . Not on file  Social History Narrative  . Not on file    Family History  Problem Relation Age of Onset  . Hypertension Mother   . Hypertension Father   . Hypertension Sister   . Cancer Sister   . Hypertension Brother   . Cancer Brother   . CVA Brother   . Transient ischemic attack Sister     ROS General: Negative; No fevers, chills, or night sweats HEENT: Negative; No changes in vision or hearing, sinus congestion, difficulty swallowing Pulmonary:  Negative; No cough, wheezing, shortness of breath, hemoptysis Cardiovascular: See HPI: No chest pain, presyncope, syncope, palpatations GI: Negative; No nausea, vomiting, diarrhea, or abdominal pain GU: Negative; No dysuria, hematuria, or difficulty voiding Musculoskeletal: Negative; no myalgias, joint pain, or weakness Hematologic: Negative; no easy bruising, bleeding Endocrine: Negative; no heat/cold intolerance; no diabetes, Neuro: Negative; no changes in balance, headaches Skin: Negative; No rashes or skin lesions Psychiatric: Negative; No behavioral problems, depression Sleep: Negative; No snoring,  daytime sleepiness, hypersomnolence, bruxism, restless legs, hypnogognic hallucinations. Other comprehensive 14 point system review is negative   Physical Exam BP 128/68   Pulse 61   Ht _0  (1.549 m)   Wt 157 lb 10.1 oz (71.5 kg)   BMI 29.78 kg/m    Repeat blood pressure by me was 142/76  Wt Readings from Last 3 Encounters:  03/18/18 157 lb 10.1 oz (71.5 kg)  06/18/17 152 lb 6.4 oz (69.1 kg)  12/29/16 154 lb 12.8 oz (70.2 kg)   General: Alert, oriented, no distress.  Skin: normal turgor, no rashes, warm and dry HEENT: Normocephalic, atraumatic. Pupils equal round and reactive to light; sclera anicteric; extraocular muscles intact;  Nose without nasal septal hypertrophy Mouth/Parynx benign; Mallinpatti scale 2 Neck: No JVD, no carotid bruits; normal carotid upstroke Lungs: clear to ausculatation and percussion; no wheezing or rales Chest wall: without tenderness to palpitation Heart: PMI not displaced, RRR, s1 s2 normal, 1/6 systolic murmur, no diastolic murmur, no rubs, gallops, thrills, or heaves Abdomen: soft, nontender; no hepatosplenomehaly, BS+; abdominal aorta nontender and not dilated by palpation. Back: no CVA tenderness Pulses 2+ Musculoskeletal: full range of motion, normal strength, no joint deformities Extremities: Trivial if any residual ankle edema no  clubbing cyanosis, Homan's sign negative  Neurologic: grossly nonfocal; Cranial nerves grossly wnl Psychologic: Normal mood and affect   ECG (independently read by me): Normal sinus rhythm at 61 bpm.  No ectopy.  Normal intervals.  May 2019 ECG (independently read by me): Normal sinus rhythm at 65 bpm.  No ectopy.  Normal intervals.  November 2018 ECG (independently read by me): Sinus bradycardia 57 bpm.  Normal intervals.  No ectopy.  No ST segment changes.  March 2018 ECG (independently read by me): Normal sinus rhythm at 60 bpm.  Preserved R waves inferiorly with small nondiagnostic Q waves in the 3  11/26/2015 ECG (independently read by me): Sinus bradycardia 59 bpm.  No ECG evidence of her prior MI.  Preserved R waves inferiorly with small Q wave in 3.  ECG (independently read by me):  Sinus bradycardia 59 bpm.  No ectopy.  No ECG criteria for her MI.  LABS:  BMP Latest Ref Rng & Units 03/18/2018 06/15/2017 04/20/2016  Glucose 65 - 99 mg/dL 100(H) 98 96  BUN 8 - 27 mg/dL _0 Creatinine 0.57 - 1.00 mg/dL 0.72 0.63 0.61  BUN/Creat Ratio 12 - _1 -  Sodium 134 - 144 mmol/L 142 141 141  Potassium 3.5 - 5.2 mmol/L 4.2 4.4 4.4  Chloride 96 - 106 mmol/L 100 101 104  CO2 20 - 29 mmol/L _2 Calcium 8.7 - 10.3 mg/dL 9.9 9.8 9.7     Hepatic Function Latest Ref Rng & Units 03/18/2018 04/20/2016 04/26/2015  Total Protein 6.0 - 8.5 g/dL 7.0 6.8 7.3  Albumin 3.7 - 4.7 g/dL 4.5 4.3 4.1  AST 0 - 40 IU/L _3 ALT 0 - 32 IU/L _4 Alk Phosphatase 39 - 117 IU/L 71 67 75  Total Bilirubin 0.0 - 1.2 mg/dL 1.6(H) 1.2 1.0    CBC Latest Ref Rng & Units 03/18/2018 06/15/2017 04/26/2015  WBC 3.4 - 10.8 x10E3/uL 9.4 6.0 6.2  Hemoglobin 11.1 - 15.9 g/dL 12.7 13.1 13.8  Hematocrit 34.0 - 46.6 % 36.5 38.1 40.1  Platelets 150 - 450 x10E3/uL 295 311 291   Lab Results  Component Value Date   MCV 86 03/18/2018   MCV 87 06/15/2017   MCV 85.5 04/26/2015    Lab Results  Component  Value Date   TSH 1.490 03/18/2018    BNP    Component Value Date/Time   BNP 552.5 (H) 01/03/2015 1000    ProBNP No results found for: PROBNP   Lipid Panel     Component Value Date/Time   CHOL 111 03/18/2018 0907   TRIG 81 03/18/2018 0907   HDL 46 03/18/2018 0907   CHOLHDL 2.4 03/18/2018 0907   CHOLHDL 2.8 04/20/2016 1039   VLDL 21 04/20/2016 1039   LDLCALC 49 03/18/2018 0907     RADIOLOGY: No results found.  IMPRESSION: 1. Essential hypertension   2. Hyperlipidemia LDL goal <70   3. Hypothyroidism, unspecified type   4. CAD in native artery   5. History of NSTEMI (non-ST elevated myocardial infarction) [facility]) with VT/VF     ASSESSMENT AND PLAN:  Cathy Reilly is a 78 year-old female who suffered a non-ST segment elevation MI in November 2016 and underwent successful stenting of a subtotally occluded LAD extending proximally into the mid vessel. She developed VT/VF requiring defibrillation in the laboratory.  At the time she was also felt to have a possible ulcerated plaque in the distal ramus immediate vessel, which ultimately cleared with anticoagulation therapy.  Since her initial presentation, she has been free of recurrent anginal symptomatology.  She took aspirin and Brilinta 90 mg bid for 1 year and ultimately was switched to reduced dose Brilinta at 60 mg twice a day, which she has tolerated well.  When I last saw her, her blood pressure was elevated and I tried further titrating amlodipine to 10 mg.  However this resulted in lower extremity edema necessitating dose reduction and institution of low-dose hydrochlorothiazide 12.5 mg.  Her blood pressure today is elevated based on hypertensive guidelines.  For this reason I will further titrate lisinopril from 20 mg up to 30 mg daily.  She will continue the amlodipine at 5 mg HCTZ as prescribed.  We discussed potential substitution with generic clopidogrel for Brilinta 60 mg but she prefers to stay on this therapy and has  tolerated it well.  I will recheck laboratory in the fasting state.  She will monitor her blood pressure.  We discussed the importance of exercise.  She continues to be on rosuvastatin 20 mg with target LDL less than 70.  In May 2019 LDL cholesterol was 57.  I will notify her regarding her laboratory and will see her in 6 months for reevaluation.  Time spent: 25 minutes Troy Sine, MD, [facility]  03/20/2018 8:43 AM

## 2018-03-18 NOTE — Patient Instructions (Signed)
Medication Instructions:  Increase Lisinopril to 30 mg (1.5 tablets)   If you need a refill on your cardiac medications before your next appointment, please call your pharmacy.   Lab work: Fasting labs today (CMET, CBC, LIPID, TSH) If you have labs (blood work) drawn today and your tests are completely normal, you will receive your results only by: Marland Kitchen MyChart Message (if you have MyChart) OR . A paper copy in the mail If you have any lab test that is abnormal or we need to change your treatment, we will call you to review the results.   Follow-Up: At [facility], you and your health needs are our priority.  As part of our continuing mission to provide you with exceptional heart care, we have created designated Provider Care Teams.  These Care Teams include your primary Cardiologist (physician) and Advanced Practice Providers (APPs -  Physician Assistants and Nurse Practitioners) who all work together to provide you with the care you need, when you need it. You will need a follow up appointment in 6 months.  Please call our office 2 months in advance to schedule this appointment.  You may see Dr.Kelly or one of the following Advanced Practice Providers on your designated Care Team: Almyra Deforest, [place] . Fabian Sharp, PA-C

## 2018-03-20 ENCOUNTER — Encounter: Payer: Self-pay | Admitting: Cardiovascular Disease

## 2018-03-25 ENCOUNTER — Telehealth: Payer: Self-pay | Admitting: Cardiovascular Disease

## 2018-03-25 DIAGNOSIS — B029 Zoster without complications: Secondary | ICD-10-CM | POA: Diagnosis not present

## 2018-03-25 NOTE — Telephone Encounter (Signed)
Called patient, she states she had blood drawn on Friday, and since then her arm has been sore, and just recently noticed bump like areas around where she had the blood draw, and its sore and hurts to touch. Patient denies the red areas anywhere else, only located on her arm, patient has no other symptoms than it is sore, arm is not swollen. Patient will contact PCP to notify them, so they can check her arm. I advised patient if she was still concerned after talking to them to call us back. Patient verbalized understanding.

## 2018-03-25 NOTE — Telephone Encounter (Signed)
New Message:    Patient states she had blood last Friday and states her arm hurt  to touch and states she has red spot

## 2018-03-28 ENCOUNTER — Emergency Department (HOSPITAL_COMMUNITY): Payer: Medicare HMO

## 2018-03-28 ENCOUNTER — Other Ambulatory Visit: Payer: Self-pay

## 2018-03-28 ENCOUNTER — Observation Stay (HOSPITAL_COMMUNITY)
Admission: EM | Admit: 2018-03-28 | Discharge: 2018-03-29 | Disposition: A | Discharge status: Home / Self Care | Payer: Medicare HMO | Attending: Internal Medicine | Admitting: Internal Medicine

## 2018-03-28 ENCOUNTER — Encounter (HOSPITAL_COMMUNITY): Payer: Self-pay

## 2018-03-28 DIAGNOSIS — M8580 Other specified disorders of bone density and structure, unspecified site: Secondary | ICD-10-CM | POA: Diagnosis not present

## 2018-03-28 DIAGNOSIS — R079 Chest pain, unspecified: Principal | ICD-10-CM | POA: Insufficient documentation

## 2018-03-28 DIAGNOSIS — I252 Old myocardial infarction: Secondary | ICD-10-CM | POA: Insufficient documentation

## 2018-03-28 DIAGNOSIS — R7303 Prediabetes: Secondary | ICD-10-CM | POA: Insufficient documentation

## 2018-03-28 DIAGNOSIS — Z7982 Long term (current) use of aspirin: Secondary | ICD-10-CM | POA: Diagnosis not present

## 2018-03-28 DIAGNOSIS — R1013 Epigastric pain: Secondary | ICD-10-CM | POA: Diagnosis present

## 2018-03-28 DIAGNOSIS — E876 Hypokalemia: Secondary | ICD-10-CM | POA: Diagnosis not present

## 2018-03-28 DIAGNOSIS — E785 Hyperlipidemia, unspecified: Secondary | ICD-10-CM | POA: Diagnosis not present

## 2018-03-28 DIAGNOSIS — I10 Essential (primary) hypertension: Secondary | ICD-10-CM

## 2018-03-28 DIAGNOSIS — Z955 Presence of coronary angioplasty implant and graft: Secondary | ICD-10-CM | POA: Diagnosis not present

## 2018-03-28 DIAGNOSIS — Z79899 Other long term (current) drug therapy: Secondary | ICD-10-CM | POA: Insufficient documentation

## 2018-03-28 DIAGNOSIS — B029 Zoster without complications: Secondary | ICD-10-CM | POA: Insufficient documentation

## 2018-03-28 DIAGNOSIS — I5031 Acute diastolic (congestive) heart failure: Secondary | ICD-10-CM | POA: Insufficient documentation

## 2018-03-28 DIAGNOSIS — I2510 Atherosclerotic heart disease of native coronary artery without angina pectoris: Secondary | ICD-10-CM | POA: Diagnosis not present

## 2018-03-28 DIAGNOSIS — Z79890 Hormone replacement therapy (postmenopausal): Secondary | ICD-10-CM | POA: Insufficient documentation

## 2018-03-28 DIAGNOSIS — R1111 Vomiting without nausea: Secondary | ICD-10-CM | POA: Diagnosis not present

## 2018-03-28 DIAGNOSIS — Z8249 Family history of ischemic heart disease and other diseases of the circulatory system: Secondary | ICD-10-CM | POA: Diagnosis not present

## 2018-03-28 DIAGNOSIS — E039 Hypothyroidism, unspecified: Secondary | ICD-10-CM | POA: Insufficient documentation

## 2018-03-28 DIAGNOSIS — I110 Hypertensive heart disease with heart failure: Secondary | ICD-10-CM | POA: Diagnosis not present

## 2018-03-28 DIAGNOSIS — Z9861 Coronary angioplasty status: Secondary | ICD-10-CM | POA: Diagnosis not present

## 2018-03-28 DIAGNOSIS — F419 Anxiety disorder, unspecified: Secondary | ICD-10-CM | POA: Diagnosis not present

## 2018-03-28 LAB — COMPREHENSIVE METABOLIC PANEL
ALT: 30 U/L (ref 0–44)
ANION GAP: 13 (ref 5–15)
AST: 37 U/L (ref 15–41)
Albumin: 3.9 g/dL (ref 3.5–5.0)
Alkaline Phosphatase: 62 U/L (ref 38–126)
BUN: 9 mg/dL (ref 8–23)
CO2: 24 mmol/L (ref 22–32)
Calcium: 9.3 mg/dL (ref 8.9–10.3)
Chloride: 102 mmol/L (ref 98–111)
Creatinine, Ser: 0.84 mg/dL (ref 0.44–1.00)
GFR calc non Af Amer: >60 mL/min (ref >60)
Glucose, Bld: 136 mg/dL — ABNORMAL HIGH (ref 70–99)
Potassium: 3.4 mmol/L — ABNORMAL LOW (ref 3.5–5.1)
Sodium: 139 mmol/L (ref 135–145)
Total Bilirubin: 1 mg/dL (ref 0.3–1.2)
Total Protein: 7.3 g/dL (ref 6.5–8.1)

## 2018-03-28 LAB — CBC WITH DIFFERENTIAL/PLATELET
Abs Immature Granulocytes: 0.04 10*3/uL (ref 0.00–0.07)
BASOS ABS: 0 10*3/uL (ref 0.0–0.1)
Basophils Relative: 1 %
EOS ABS: 0.4 10*3/uL (ref 0.0–0.5)
Eosinophils Relative: 5 %
HCT: 37.5 % (ref 36.0–46.0)
Hemoglobin: 12.2 g/dL (ref 12.0–15.0)
Immature Granulocytes: 1 %
Lymphocytes Relative: 17 %
Lymphs Abs: 1.2 10*3/uL (ref 0.7–4.0)
MCH: 29 pg (ref 26.0–34.0)
MCHC: 32.5 g/dL (ref 30.0–36.0)
MCV: 89.1 fL (ref 80.0–100.0)
Monocytes Absolute: 0.7 10*3/uL (ref 0.1–1.0)
Monocytes Relative: 10 %
Neutro Abs: 5 10*3/uL (ref 1.7–7.7)
Neutrophils Relative %: 66 %
Platelets: 242 10*3/uL (ref 150–400)
RBC: 4.21 MIL/uL (ref 3.87–5.11)
RDW: 12.8 % (ref 11.5–15.5)
WBC: 7.4 10*3/uL (ref 4.0–10.5)
nRBC: 0 % (ref 0.0–0.2)

## 2018-03-28 LAB — I-STAT TROPONIN, ED: Troponin i, poc: 0 ng/mL (ref 0.00–0.08)

## 2018-03-28 MED ORDER — LEVOTHYROXINE SODIUM 88 MCG PO TABS: 88.0000 ug | ORAL_TABLET | Freq: Every day | ORAL | Status: DC

## 2018-03-28 MED ORDER — ONDANSETRON HCL 4 MG/2ML IJ SOLN: 4.0000 mg | Freq: Four times a day (QID) | INTRAMUSCULAR | Status: DC | PRN

## 2018-03-28 MED ORDER — POTASSIUM CHLORIDE CRYS ER 20 MEQ PO TBCR
20.0000 meq | EXTENDED_RELEASE_TABLET | Freq: Once | ORAL | Status: AC
  Administered 2018-03-29: 20 meq via ORAL
  Filled 2018-03-28: qty 1

## 2018-03-28 MED ORDER — METOPROLOL TARTRATE 25 MG PO TABS
37.5000 mg | ORAL_TABLET | Freq: Two times a day (BID) | ORAL | Status: DC
  Administered 2018-03-29: 37.5 mg via ORAL
  Filled 2018-03-28: qty 2

## 2018-03-28 MED ORDER — AMLODIPINE BESYLATE 5 MG PO TABS: 5.0000 mg | ORAL_TABLET | Freq: Every day | ORAL | Status: DC

## 2018-03-28 MED ORDER — TICAGRELOR 60 MG PO TABS
60.0000 mg | ORAL_TABLET | Freq: Two times a day (BID) | ORAL | Status: DC
  Administered 2018-03-29: 60 mg via ORAL
  Filled 2018-03-28: qty 1

## 2018-03-28 MED ORDER — ASPIRIN 81 MG PO CHEW: 81.0000 mg | CHEWABLE_TABLET | Freq: Every day | ORAL | Status: DC

## 2018-03-28 MED ORDER — HYDROCHLOROTHIAZIDE 12.5 MG PO CAPS: 12.5000 mg | ORAL_CAPSULE | Freq: Every day | ORAL | Status: DC

## 2018-03-28 MED ORDER — NITROGLYCERIN 0.4 MG SL SUBL: 0.4000 mg | SUBLINGUAL_TABLET | SUBLINGUAL | Status: DC | PRN

## 2018-03-28 MED ORDER — ROSUVASTATIN CALCIUM 20 MG PO TABS
20.0000 mg | ORAL_TABLET | Freq: Every evening | ORAL | Status: DC
  Filled 2018-03-28: qty 1

## 2018-03-28 MED ORDER — ASPIRIN 81 MG PO CHEW: 324.0000 mg | CHEWABLE_TABLET | Freq: Once | ORAL | Status: DC

## 2018-03-28 MED ORDER — VALACYCLOVIR HCL 500 MG PO TABS
1000.0000 mg | ORAL_TABLET | Freq: Three times a day (TID) | ORAL | Status: DC
  Administered 2018-03-29: 1000 mg via ORAL
  Filled 2018-03-28 (×2): qty 2

## 2018-03-28 MED ORDER — MORPHINE SULFATE (PF) 2 MG/ML IV SOLN: 1.0000 mg | INTRAVENOUS | Status: DC | PRN

## 2018-03-28 MED ORDER — ENOXAPARIN SODIUM 40 MG/0.4ML ~~LOC~~ SOLN
40.0000 mg | SUBCUTANEOUS | Status: DC
  Filled 2018-03-28: qty 0.4

## 2018-03-28 MED ORDER — ACETAMINOPHEN 325 MG PO TABS: 650.0000 mg | ORAL_TABLET | ORAL | Status: DC | PRN

## 2018-03-28 MED ORDER — LISINOPRIL 20 MG PO TABS: 30.0000 mg | ORAL_TABLET | Freq: Every day | ORAL | Status: DC

## 2018-03-28 NOTE — H&P (Signed)
History and Physical    Cathy Reilly GNO:[id number] DOB: 09-Apr-1940 DOA: 03/28/2018  PCP: Carol Ada, MD   Patient coming from: Home   Chief Complaint: Chest/epigastric pain   HPI: Cathy Reilly is a 78 y.o. female with medical history significant for coronary artery disease, hypertension, and hypothyroidism, now presenting to the emergency department for evaluation of acute pain in the lower chest/epigastrium.  Patient developed a vesicular rash and pain involving her left arm on 03/24/2018, saw her PCP for evaluation of this, was diagnosed with herpes zoster, and started on antiviral and tramadol on 03/26/2018.  She had otherwise been in her usual state until shortly after dinner tonight when she developed acute onset of pain in the epigastrium and lower chest.  She developed nausea and mild dyspnea with this, vomited once after 15 or 20 minutes of symptoms, and then experienced complete resolution.  She denies any fevers, chills, or cough.  Denies swelling or pain in the lower extremities.  Reports that she had not experienced any chest pain since she received a stent to her LAD, and the pain she experienced prior to this was nothing like the episode tonight.  She called EMS and was treated with 324 mg of aspirin prior to arrival in the ED.  ED Course: Upon arrival to the ED, patient is found to be afebrile, saturating well on room air, and with vitals otherwise normal.  EKG features a sinus rhythm with nonspecific ST-T abnormality in the inferior leads, similar to prior.  Chest x-ray is negative for acute cardiopulmonary disease.  Chemistry panel notable for slight hypokalemia and CBC is unremarkable.  Initial troponin is normal.  Patient remains hemodynamically stable and will be observed for further evaluation and management.  Review of Systems:  All other systems reviewed and apart from HPI, are negative.  Past Medical History:  Diagnosis Date  . Anxiety   . Bronchitis 12/2014  . CAD  (coronary artery disease), native coronary artery s/p DES to LAD 12/20/2014 01/03/2015  . Hypertension   . Hypothyroidism   . NSTEMI (non-ST elevated myocardial infarction) ([place]) 12/2014  . Osteopenia    hip  . Prediabetes Hgb A1C 6.0 01/03/2015  . Thyroid disease   . VF (ventricular fibrillation) during cath on 12/20/2014 from occluded mid LAD, s/p defib     Past Surgical History:  Procedure Laterality Date  . APPENDECTOMY    . CARDIAC CATHETERIZATION N/A 12/20/2014   Procedure: Left Heart Cath and Coronary Angiography;  Surgeon: Troy Sine, MD;  Location: [facility];  Service: Cardiovascular;  Laterality: N/A;  . CARDIAC CATHETERIZATION N/A 12/20/2014   Procedure: Coronary Stent Intervention;  Surgeon: Troy Sine, MD;  Location: [facility];  Service: Cardiovascular;  Laterality: N/A;  . CARDIAC CATHETERIZATION N/A 12/24/2014   Procedure: Left Heart Cath and Coronary Angiography;  Surgeon: Jettie Booze, MD;  Location: [facility];  Service: Cardiovascular;  Laterality: N/A;  . CHOLECYSTECTOMY    . CORONARY ANGIOPLASTY WITH STENT PLACEMENT    . TONSILLECTOMY       reports that she has never smoked. She has never used smokeless tobacco. She reports that she does not drink alcohol or use drugs.  Allergies  Allergen Reactions  . Codeine Nausea And Vomiting  . Crestor [Rosuvastatin] Other (See Comments)    myaglia  . Lipitor [Atorvastatin] Other (See Comments)    myaglia  . Septra [Sulfamethoxazole-Trimethoprim] Nausea And Vomiting  . Simvastatin Other (See Comments)  myaglia  . Welchol [Colesevelam Hcl] Other (See Comments)    myaglia  . Prednisone Palpitations    Family History  Problem Relation Age of Onset  . Hypertension Mother   . Hypertension Father   . Hypertension Sister   . Cancer Sister   . Hypertension Brother   . Cancer Brother   . CVA Brother   . Transient ischemic attack Sister      Prior to Admission medications    Medication Sig Start Date End Date Taking? Authorizing Provider  amLODipine (NORVASC) 5 MG tablet Take 1 tablet (5 mg total) by mouth daily. 03/18/18  Yes Troy Sine, MD  aspirin 81 MG tablet Take 81 mg by mouth daily.   Yes [provider]  ELDERBERRY PO Take 50 mg by mouth 2 (two) times daily.   Yes [provider]  hydrochlorothiazide (MICROZIDE) 12.5 MG capsule Take 1 capsule (12.5 mg total) by mouth daily. 11/16/17 03/29/27 Yes Troy Sine, MD  levothyroxine (SYNTHROID, LEVOTHROID) 88 MCG tablet Take 88 mcg by mouth daily before breakfast.   Yes [provider]  lisinopril (PRINIVIL,ZESTRIL) 20 MG tablet Take 1.5 tablets (30 mg total) by mouth daily. 03/18/18 06/16/18 Yes Troy Sine, MD  metoprolol tartrate (LOPRESSOR) 25 MG tablet TAKE 1 AND 1/2 TABLETS TWO TIMES DAILY Patient taking differently: Take 37.5 mg by mouth 2 (two) times daily.  08/03/17  Yes Troy Sine, MD  nitroGLYCERIN (NITROSTAT) 0.4 MG SL tablet Place 1 tablet (0.4 mg total) under the tongue every 5 (five) minutes x 3 doses as needed for chest pain. 02/05/15  Yes Troy Sine, MD  rosuvastatin (CRESTOR) 20 MG tablet TAKE 1 TABLET DAILY (PLEASE MAKE APPOINTMENT FOR REFILLS) Patient taking differently: Take 20 mg by mouth every evening.  12/27/17  Yes Troy Sine, MD  ticagrelor (BRILINTA) 60 MG TABS tablet Take 1 tablet (60 mg total) by mouth 2 (two) times daily. 06/26/16  Yes Troy Sine, MD    Physical Exam: Vitals:   03/28/18 2109 03/28/18 2115 03/28/18 2200 03/28/18 2215  BP: (!) 144/62 (!) 137/51 (!) 123/93 129/61  Pulse: 70 70 68 68  Resp: 15 18 18 16   Temp: 98.5 F (36.9 C)     TempSrc: Oral     SpO2: 97% 96% 95% 96%  Weight: 70.3 kg     Height: 5\' 1"  (1.549 m)       Constitutional: NAD, calm  Eyes: PERTLA, lids and conjunctivae normal ENMT: Mucous membranes are moist. Posterior pharynx clear of any exudate or lesions.   Neck: normal, supple, no masses, no  thyromegaly Respiratory: clear to auscultation bilaterally, no wheezing, no crackles. Normal respiratory effort.    Cardiovascular: S1 & S2 heard, regular rate and rhythm. No extremity edema.   Abdomen: No distension, no tenderness, soft. Bowel sounds normal.  Musculoskeletal: no clubbing / cyanosis. No joint deformity upper and lower extremities.    Skin: Vesicular lesions on background of erythema involving anterolateral LUE with most lesions crusted. Warm, dry, well-perfused. Neurologic: CN 2-12 grossly intact. Sensation intact, DTR normal. Strength 5/5 in all 4 limbs.  Psychiatric: Alert and oriented x 3. Calm, cooperative.    Labs on Admission: I have personally reviewed following labs and imaging studies  CBC: Recent Labs  Lab 03/28/18 2133  WBC 7.4  NEUTROABS 5.0  HGB 12.2  HCT 37.5  MCV 89.1  PLT 948   Basic Metabolic Panel: Recent Labs  Lab 03/28/18 2133  NA  139  K 3.4*  CL 102  CO2 24  GLUCOSE 136*  BUN 9  CREATININE 0.84  CALCIUM 9.3   GFR: Estimated Creatinine Clearance: 51.1 mL/min (by C-G formula based on SCr of 0.84 mg/dL). Liver Function Tests: Recent Labs  Lab 03/28/18 2133  AST 37  ALT 30  ALKPHOS 62  BILITOT 1.0  PROT 7.3  ALBUMIN 3.9   No results for input(s): LIPASE, AMYLASE in the last 168 hours. No results for input(s): AMMONIA in the last 168 hours. Coagulation Profile: No results for input(s): INR, PROTIME in the last 168 hours. Cardiac Enzymes: No results for input(s): CKTOTAL, CKMB, CKMBINDEX, TROPONINI in the last 168 hours. BNP (last 3 results) No results for input(s): PROBNP in the last 8760 hours. HbA1C: No results for input(s): HGBA1C in the last 72 hours. CBG: No results for input(s): GLUCAP in the last 168 hours. Lipid Profile: No results for input(s): CHOL, HDL, LDLCALC, TRIG, CHOLHDL, LDLDIRECT in the last 72 hours. Thyroid Function Tests: No results for input(s): TSH, T4TOTAL, FREET4, T3FREE, THYROIDAB in the last 72  hours. Anemia Panel: No results for input(s): VITAMINB12, FOLATE, FERRITIN, TIBC, IRON, RETICCTPCT in the last 72 hours. Urine analysis: No results found for: COLORURINE, APPEARANCEUR, LABSPEC, PHURINE, GLUCOSEU, HGBUR, BILIRUBINUR, KETONESUR, PROTEINUR, UROBILINOGEN, NITRITE, LEUKOCYTESUR Sepsis Labs: @LABRCNTIP (procalcitonin:4,lacticidven:4) )No results found for this or any previous visit (from the past 240 hour(s)).   Radiological Exams on Admission: Dg Chest Portable 1 View  Result Date: 03/28/2018 CLINICAL DATA:  Chest pain, vomiting EXAM: PORTABLE CHEST 1 VIEW COMPARISON:  01/03/2015 FINDINGS: Heart and mediastinal contours are within normal limits. No focal opacities or effusions. No acute bony abnormality. IMPRESSION: No active disease. Electronically Signed   By: Rolm Baptise M.D.   On: 03/28/2018 21:48    EKG: Independently reviewed. Sinus rhythm, non-specific ST-T abnormality in inferior leads, similar to prior.   Assessment/Plan   1. Chest pain; CAD  - Patient has known CAD with hx of stent to LAD  - She presents after an episode of pain in lower chest and epigastrium that resolved after vomiting  - EKG does not appear significantly different, CXR is unremarkable, and troponin is 0.00   - She was treated with ASA 324 mg prior to arrival in ED   - Likely GI-etiology, but she has known CAD and was referred for admission for cardiac rule-out  - Continue cardiac monitoring, check serial troponin levels, repeat EKG, continue ASA, Brilinta, statin, ACE, and beta-blocker    2. Zoster  - She developed pain and vesicular rash on 03/23/18 involving C5 dermatome on the left  - There is a couple lesions that are not yet crusted  - Continue antiviral, contact and airborne precautions    3. Hypertension  - BP at goal, continue Norvasc, lisinopril, HCTZ, and Lopressor     DVT prophylaxis: Lovenox  Code Status: Full  Family Communication: Husband updated at bedside Consults called:  None Admission status: Observation     Vianne Bulls, MD [facility] Pager [phone]  If 7PM-7AM, please contact night-coverage [email or web address] Password [email or web address]  03/28/2018, 11:18 PM

## 2018-03-28 NOTE — ED Notes (Signed)
RN informed Pt can receive visitor  

## 2018-03-28 NOTE — ED Provider Notes (Signed)
[facility] EMERGENCY DEPARTMENT Provider Note   CSN: [id number] Arrival date & time: 03/28/18  2059    History   Chief Complaint Chief Complaint  Patient presents with  . Chest Pain    HPI Cathy Reilly is a 78 y.o. female.      Chest Pain  Pain location:  Substernal area Pain quality: pressure   Pain radiates to:  Does not radiate Pain severity:  Moderate Onset quality:  Sudden Duration:  15 minutes Timing:  Constant Progression:  Resolved Chronicity:  New Context: at rest   Relieved by:  None tried Worsened by:  Nothing Ineffective treatments:  None tried Associated symptoms: no abdominal pain, no altered mental status, no back pain, no cough, no fever, no lower extremity edema, no palpitations, no shortness of breath and no vomiting   Risk factors: coronary artery disease     Past Medical History:  Diagnosis Date  . Anxiety   . Bronchitis 12/2014  . CAD (coronary artery disease), native coronary artery s/p DES to LAD 12/20/2014 01/03/2015  . Hypertension   . Hypothyroidism   . NSTEMI (non-ST elevated myocardial infarction) ([place]) 12/2014  . Osteopenia    hip  . Prediabetes Hgb A1C 6.0 01/03/2015  . Thyroid disease   . VF (ventricular fibrillation) during cath on 12/20/2014 from occluded mid LAD, s/p defib     Patient Active Problem List   Diagnosis Date Noted  . Chest pain 03/28/2018  . Zoster 03/28/2018  . Hypokalemia 03/28/2018  . Hyperlipidemia LDL goal <70 04/18/2015  . CAD S/P LAD DES 12/20/14 01/10/2015  . Chest pain with moderate risk for cardiac etiology - more consistent with DHF. 01/03/2015  . Atherosclerotic heart disease of native coronary artery with other forms of angina pectoris ([place]) 01/03/2015  . Essential hypertension 01/03/2015  . Hyperlipidemia with target LDL less than 70 01/03/2015  . Hypothyroidism 01/03/2015  . Prediabetes Hgb A1C 6.0 01/03/2015  . Acute diastolic heart failure ([place]) 01/03/2015  . Presence  of drug coated stent in LAD coronary artery   . History of NSTEMI (non-ST elevated myocardial infarction) ([place]) 12/20/2014  . VF (ventricular fibrillation) during cath on 12/20/2014 from occluded mid LAD, s/p defib     Past Surgical History:  Procedure Laterality Date  . APPENDECTOMY    . CARDIAC CATHETERIZATION N/A 12/20/2014   Procedure: Left Heart Cath and Coronary Angiography;  Surgeon: Troy Sine, MD;  Location: [facility];  Service: Cardiovascular;  Laterality: N/A;  . CARDIAC CATHETERIZATION N/A 12/20/2014   Procedure: Coronary Stent Intervention;  Surgeon: Troy Sine, MD;  Location: [facility];  Service: Cardiovascular;  Laterality: N/A;  . CARDIAC CATHETERIZATION N/A 12/24/2014   Procedure: Left Heart Cath and Coronary Angiography;  Surgeon: Jettie Booze, MD;  Location: [facility];  Service: Cardiovascular;  Laterality: N/A;  . CHOLECYSTECTOMY    . CORONARY ANGIOPLASTY WITH STENT PLACEMENT    . TONSILLECTOMY       OB History   No obstetric history on file.      Home Medications    Prior to Admission medications   Medication Sig Start Date End Date Taking? Authorizing Provider  amLODipine (NORVASC) 5 MG tablet Take 1 tablet (5 mg total) by mouth daily. 03/18/18  Yes Troy Sine, MD  aspirin 81 MG tablet Take 81 mg by mouth daily.   Yes [provider]  ELDERBERRY PO Take 50 mg by mouth 2 (two) times  daily.   Yes [provider]  hydrochlorothiazide (MICROZIDE) 12.5 MG capsule Take 1 capsule (12.5 mg total) by mouth daily. 11/16/17 03/29/27 Yes Troy Sine, MD  levothyroxine (SYNTHROID, LEVOTHROID) 88 MCG tablet Take 88 mcg by mouth daily before breakfast.   Yes [provider]  lisinopril (PRINIVIL,ZESTRIL) 20 MG tablet Take 1.5 tablets (30 mg total) by mouth daily. 03/18/18 06/16/18 Yes Troy Sine, MD  metoprolol tartrate (LOPRESSOR) 25 MG tablet TAKE 1 AND 1/2 TABLETS TWO TIMES DAILY Patient  taking differently: Take 37.5 mg by mouth 2 (two) times daily.  08/03/17  Yes Troy Sine, MD  nitroGLYCERIN (NITROSTAT) 0.4 MG SL tablet Place 1 tablet (0.4 mg total) under the tongue every 5 (five) minutes x 3 doses as needed for chest pain. 02/05/15  Yes Troy Sine, MD  rosuvastatin (CRESTOR) 20 MG tablet TAKE 1 TABLET DAILY (PLEASE MAKE APPOINTMENT FOR REFILLS) Patient taking differently: Take 20 mg by mouth every evening.  12/27/17  Yes Troy Sine, MD  ticagrelor (BRILINTA) 60 MG TABS tablet Take 1 tablet (60 mg total) by mouth 2 (two) times daily. 06/26/16  Yes Troy Sine, MD  amLODipine (NORVASC) 5 MG tablet Take 1 tablet (5 mg total) by mouth daily. Patient not taking: Reported on 03/28/2018 03/18/18   Troy Sine, MD  BRILINTA 60 MG TABS tablet TAKE 1 TABLET BY MOUTH TWICE DAILY Patient not taking: Reported on 03/28/2018 08/03/17   Troy Sine, MD    Family History Family History  Problem Relation Age of Onset  . Hypertension Mother   . Hypertension Father   . Hypertension Sister   . Cancer Sister   . Hypertension Brother   . Cancer Brother   . CVA Brother   . Transient ischemic attack Sister     Social History Social History   Tobacco Use  . Smoking status: Never Smoker  . Smokeless tobacco: Never Used  Substance Use Topics  . Alcohol use: No    Alcohol/week: 0.0 standard drinks  . Drug use: No     Allergies   Codeine; Crestor [rosuvastatin]; Lipitor [atorvastatin]; Septra [sulfamethoxazole-trimethoprim]; Simvastatin; Welchol [colesevelam hcl]; and Prednisone   Review of Systems Review of Systems  Constitutional: Negative for chills and fever.  HENT: Negative for ear pain and sore throat.   Eyes: Negative for pain and visual disturbance.  Respiratory: Negative for cough and shortness of breath.   Cardiovascular: Positive for chest pain. Negative for palpitations.  Gastrointestinal: Negative for abdominal pain and vomiting.  Genitourinary:  Negative for dysuria and hematuria.  Musculoskeletal: Negative for arthralgias and back pain.  Skin: Negative for color change and rash.  Neurological: Negative for seizures and syncope.  All other systems reviewed and are negative.    Physical Exam Updated Vital Signs BP 129/61   Pulse 68   Temp 98.5 F (36.9 C) (Oral)   Resp 16   Ht 5\' 1"  (1.549 m)   Wt 70.3 kg   SpO2 96%   BMI 29.29 kg/m   Physical Exam Vitals signs and nursing note reviewed.  Constitutional:      General: She is not in acute distress.    Appearance: She is well-developed.     Comments: Patient resting comfortably, chest pain-free.  HENT:     Head: Normocephalic and atraumatic.  Eyes:     Conjunctiva/sclera: Conjunctivae normal.  Neck:     Musculoskeletal: Neck supple.  Cardiovascular:     Rate and Rhythm: Normal  rate and regular rhythm.     Heart sounds: No murmur.  Pulmonary:     Effort: Pulmonary effort is normal. No respiratory distress.     Breath sounds: Normal breath sounds.  Abdominal:     Palpations: Abdomen is soft.     Tenderness: There is no abdominal tenderness.  Musculoskeletal: Normal range of motion.  Skin:    General: Skin is warm and dry.     Capillary Refill: Capillary refill takes less than 2 seconds.  Neurological:     General: No focal deficit present.     Mental Status: She is alert.  Psychiatric:        Mood and Affect: Mood normal.      ED Treatments / Results  Labs (all labs ordered are listed, but only abnormal results are displayed) Labs Reviewed  COMPREHENSIVE METABOLIC PANEL - Abnormal; Notable for the following components:      Result Value   Potassium 3.4 (*)    Glucose, Bld 136 (*)    All other components within normal limits  CBC WITH DIFFERENTIAL/PLATELET  I-STAT TROPONIN, ED    EKG EKG Interpretation  Date/Time:  Monday March 28 2018 21:05:36 EST Ventricular Rate:  71 PR Interval:    QRS Duration: 109 QT Interval:  445 QTC  Calculation: 484 R Axis:   74 Text Interpretation:  Sinus rhythm Consider inferior infarct No acute changes No significant change since last tracing Confirmed by Varney Biles [phone]) on 03/28/2018 10:54:46 PM   Radiology Dg Chest Portable 1 View  Result Date: 03/28/2018 CLINICAL DATA:  Chest pain, vomiting EXAM: PORTABLE CHEST 1 VIEW COMPARISON:  01/03/2015 FINDINGS: Heart and mediastinal contours are within normal limits. No focal opacities or effusions. No acute bony abnormality. IMPRESSION: No active disease. Electronically Signed   By: Rolm Baptise M.D.   On: 03/28/2018 21:48    Procedures Procedures (including critical care time)  Medications Ordered in ED Medications - No data to display   Initial Impression / Assessment and Plan / ED Course  I have reviewed the triage vital signs and the nursing notes.  Pertinent labs & imaging results that were available during my care of the patient were reviewed by me and considered in my medical decision making (see chart for details).        78 year old female significant past medical history of prior MI with stent by 2 years ago who presents with one episode of chest pain lasting approximately 15 minutes concerning for ACS.  Patient was eating at time of the chest pain and had an episode of vomiting shortly thereafter chest pain resolved.  Patient denied radiation of the pain, neurological symptoms, infectious-like symptoms, shortness of breath, dizziness.  Concern for ACS, possible acid reflux, possible food related due to patient feeling better after vomiting.  Doubt PE due to no shortness of breath, no tachycardia, no lower peripheral swelling, no other risk factors.  Doubt infection due to no fever, no chills or night sweats.  We will obtain laboratory studies, chest x-ray.  Initial troponin negative, laboratory studies appropriate, EKG shows no signs of ST elevation or depression, appropriate intervals.  Patient is high risk has  not had a recent stress test.  Due to high risk patient will need ACS rule out as an inpatient with echo and stress testing.  Inpatient team in agreement with this plan.  Patient in agreement with this plan.  Patient admitted in stable condition with stable vital signs.  The above care was  discussed and agreed upon by my attending physician.  Final Clinical Impressions(s) / ED Diagnoses   Final diagnoses:  Chest pain, unspecified type    ED Discharge Orders    None       Orson Aloe, MD 03/28/18 1572    Varney Biles, MD 03/29/18 1510

## 2018-03-28 NOTE — ED Triage Notes (Addendum)
Per [facility], pt from home w/ a c/o sub sternal/epigastric pain that began shortly after eating and taking Tramadol. The pt has never taken Tramadol before. She became nauseous and had one episode of vomiting. The pain completely dissipated after vomiting. The pain did not radiate and lasted ~15-20 mins. Pt has shingles on her left arm and was treating the pain with Tramadol.  1554/66 HR 72 RR 16 97% RA  1 nitro 325 mg ASA

## 2018-03-29 LAB — TROPONIN I
Troponin I: <0.03 ng/mL (ref <0.03)
Troponin I: <0.03 ng/mL (ref <0.03)

## 2018-03-29 NOTE — ED Notes (Signed)
Hospitalist states that patient may eat.

## 2018-03-29 NOTE — ED Notes (Signed)
Pt was ambulatory to the bathroom.

## 2018-03-29 NOTE — ED Notes (Signed)
Respiratory at bedside collecting sample

## 2018-03-29 NOTE — ED Notes (Signed)
Patient verbalizes understanding of discharge instructions. Opportunity for questioning and answers were provided. Armband removed by staff, pt discharged from ED.  

## 2018-03-29 NOTE — ED Notes (Signed)
Patient is NPO at this time, No Diet was ordered for Lunch.

## 2018-03-29 NOTE — ED Notes (Signed)
Hospitalist at bedside 

## 2018-03-30 ENCOUNTER — Other Ambulatory Visit: Payer: Self-pay | Admitting: Cardiovascular Disease

## 2018-03-30 ENCOUNTER — Telehealth: Payer: Self-pay | Admitting: Cardiovascular Disease

## 2018-03-30 NOTE — Telephone Encounter (Signed)
Spoke to patient . Aware no samples are available. Patient states she has rx  ,but just trying to save a liitle. Offered patient to fillout for patient assistance - she request  Form be sent to her and she will bring back to be completed. She states she tried a few years ago, RN informed patient it will not hurt to try again.  information mailed

## 2018-03-30 NOTE — Telephone Encounter (Signed)
Follow up: ° ° °Patient would like for some one to call her back. °

## 2018-03-30 NOTE — Telephone Encounter (Signed)
Noted! Thank you

## 2018-03-30 NOTE — Telephone Encounter (Signed)
Patient calling the office for samples of medication:   1.  What medication and dosage are you requesting samples for? Cathy Reilly 60 mg  2.  Are you currently out of this medication? No

## 2018-03-30 NOTE — Telephone Encounter (Signed)
Prescription was e-sent to  Mail order- f/u appt will be 8 /20 per dr [facility] nurse

## 2018-04-28 ENCOUNTER — Other Ambulatory Visit: Payer: Self-pay | Admitting: Cardiovascular Disease

## 2018-04-28 MED ORDER — HYDROCHLOROTHIAZIDE 12.5 MG PO CAPS: 12.5000 mg | ORAL_CAPSULE | Freq: Every day | ORAL | 3 refills | Status: DC

## 2018-04-28 MED ORDER — TICAGRELOR 60 MG PO TABS: 60.0000 mg | ORAL_TABLET | Freq: Two times a day (BID) | ORAL | 3 refills | Status: DC

## 2018-04-28 NOTE — Telephone Encounter (Signed)
New Message    *STAT* If patient is at the pharmacy, call can be transferred to refill team.   1. Which medications need to be refilled? (please list name of each medication and dose if known) ticagrelor (BRILINTA) 60 MG TABS tablet and hydrochlorothiazide (MICROZIDE) 12.5 MG capsule    2. Which pharmacy/location (including street and city if local pharmacy) is medication to be sent to? [place], [place] 3. Do they need a 30 day or 90 day supply? [place]

## 2018-04-28 NOTE — Telephone Encounter (Signed)
Pt's medications were sent to pt's pharmacy as requested. Confirmation received.  

## 2018-05-31 ENCOUNTER — Ambulatory Visit: Payer: Medicare HMO | Admitting: Cardiovascular Disease

## 2018-07-29 ENCOUNTER — Other Ambulatory Visit: Payer: Self-pay | Admitting: Cardiovascular Disease

## 2018-08-15 DIAGNOSIS — H5213 Myopia, bilateral: Secondary | ICD-10-CM | POA: Diagnosis not present

## 2018-08-15 DIAGNOSIS — H5203 Hypermetropia, bilateral: Secondary | ICD-10-CM | POA: Diagnosis not present

## 2018-09-16 ENCOUNTER — Other Ambulatory Visit: Payer: Self-pay | Admitting: Cardiovascular Disease

## 2018-09-19 ENCOUNTER — Other Ambulatory Visit: Payer: Self-pay

## 2018-09-19 MED ORDER — LISINOPRIL 20 MG PO TABS: 30.0000 mg | ORAL_TABLET | Freq: Every day | ORAL | 3 refills | Status: DC

## 2018-09-29 ENCOUNTER — Other Ambulatory Visit: Payer: Self-pay | Admitting: Cardiovascular Disease

## 2018-09-30 ENCOUNTER — Telehealth: Payer: Self-pay | Admitting: Cardiovascular Disease

## 2018-09-30 ENCOUNTER — Other Ambulatory Visit: Payer: Self-pay

## 2018-09-30 MED ORDER — ROSUVASTATIN CALCIUM 20 MG PO TABS: 20.0000 mg | ORAL_TABLET | Freq: Every day | ORAL | 0 refills | Status: DC

## 2018-09-30 MED ORDER — ROSUVASTATIN CALCIUM 20 MG PO TABS: 20.0000 mg | ORAL_TABLET | Freq: Every day | ORAL | 5 refills | Status: AC

## 2018-09-30 NOTE — Telephone Encounter (Signed)
 *  STAT* If patient is at the pharmacy, call can be transferred to refill team.   1. Which medications need to be refilled? (please list name of each medication and dose if known) rosuvastatin (CRESTOR) 20 MG tablet  2. Which pharmacy/location (including street and city if local pharmacy) is medication to be sent to? [facility] [place]  3. Do they need a 30 day or 90 day supply? [place]

## 2018-09-30 NOTE — Telephone Encounter (Signed)
Pt's medication was sent to pt's pharmacy as requested. Confirmation received.  °

## 2018-10-25 ENCOUNTER — Other Ambulatory Visit: Payer: Self-pay

## 2018-10-25 ENCOUNTER — Encounter: Payer: Self-pay | Admitting: Cardiovascular Disease

## 2018-10-25 ENCOUNTER — Ambulatory Visit (INDEPENDENT_AMBULATORY_CARE_PROVIDER_SITE_OTHER): Payer: Medicare HMO | Admitting: Cardiovascular Disease

## 2018-10-25 DIAGNOSIS — Z9861 Coronary angioplasty status: Secondary | ICD-10-CM | POA: Diagnosis not present

## 2018-10-25 DIAGNOSIS — I214 Non-ST elevation (NSTEMI) myocardial infarction: Secondary | ICD-10-CM | POA: Diagnosis not present

## 2018-10-25 DIAGNOSIS — E039 Hypothyroidism, unspecified: Secondary | ICD-10-CM

## 2018-10-25 DIAGNOSIS — I25118 Atherosclerotic heart disease of native coronary artery with other forms of angina pectoris: Secondary | ICD-10-CM | POA: Diagnosis not present

## 2018-10-25 DIAGNOSIS — I10 Essential (primary) hypertension: Secondary | ICD-10-CM | POA: Diagnosis not present

## 2018-10-25 DIAGNOSIS — I2510 Atherosclerotic heart disease of native coronary artery without angina pectoris: Secondary | ICD-10-CM | POA: Diagnosis not present

## 2018-10-25 DIAGNOSIS — E785 Hyperlipidemia, unspecified: Secondary | ICD-10-CM | POA: Diagnosis not present

## 2018-10-25 MED ORDER — LISINOPRIL 40 MG PO TABS: 40.0000 mg | ORAL_TABLET | Freq: Every day | ORAL | 3 refills | Status: DC

## 2018-10-25 NOTE — Patient Instructions (Signed)
Medication Instructions:  INCREASE LISINOPRIL 40MG  DAILY If you need a refill on your cardiac medications before your next appointment, please call your pharmacy.  Labwork: HAVE LABS DONE WITH DR Tamala Julian   Follow-Up: You will need a follow up appointment in 12 months.  Please call our office 3 months in advance, June 2021 to schedule this, September 2021 appointment.  You may see Shelva Majestic, MD or one of the following Advanced Practice Providers on your designated Care Team:  Gearldine Shown, PA-C  Fabian Sharp, PA-C     At [facility], you and your health needs are our priority.  As part of our continuing mission to provide you with exceptional heart care, we have created designated Provider Care Teams.  These Care Teams include your primary Cardiologist (physician) and Advanced Practice Providers (APPs -  Physician Assistants and Nurse Practitioners) who all work together to provide you with the care you need, when you need it.  Thank you for choosing [facility] at [facility]!!

## 2018-10-25 NOTE — Progress Notes (Signed)
Patient ID: Cathy Reilly, female   DOB: 11/11/1940, 78 y.o.   MRN: [id number]    PCP: Dr. Carol Ada  HPI: Cathy Reilly is a 78 y.o. female who presents to the office today for a 7 month follow up cardiology evaluation.   Ms. Laske has a history of hypertension and hypothyroidism.  She was admitted in the early morning on 12/19/2014 with a symptom complex suggestive of unstable angina.  She ruled in for non-ST segment elevation MI with initial troponin of 5.  She was referred for cardiac catheterization.  In the catheterization laboratory she developed an episode of VT/VF and required defibrillation.  She underwent successful intervention to a subtotally/totally occluded LAD which had diffuse proximal to mid disease and was successfully stented with a 3033 mm Xience Alpine stent.  She was also felt to have a possible spontaneous dissection with a 90% distal intermediate stenosis ulcerated plaque.  She was maintained on anticoagulation and several days later was brought back to the catheterization laboratory.  Her LAD stent was widely patent and there was resolution of her prior intermediate stenosis.  Subsequent, she has remained stable.  She is participating in phase II cardiac rehabilitation.  She denies recurrent anginal symptoms.  She admits to being active.  She is unaware of any palpitations.  When I saw her in October 2017 she had been on aspirin 81 mg and Brilinta 90 twice a day for dual antiplatelet therapy. I discussed the Pegasus trial data and ultimately switched her to 60 mg, Brilinta twice a day, which she has been taking and tolerating.  When I saw her in November 2018 she was stable and denied any episodes of chest pain or palpitations.  She stays active taking care of her 9 grandchildren and has 5 great-grandchildren.  She has been on amlodipine 5 mg, lisinopril 20 mg, metoprolol 37.5 mg twice a day for hypertension and post MI regimen.  She was on aspirin and low-dose Brilinta at  60 mg twice a day for dual antiplatelet therapy.  She has been on Crestor mg daily for hyperlipidemia with target LDL less than 70.  She states her blood pressure typically at home ranges in the 130 to 160 range.    When seen in May 2019  she denied any recurrent anginal symptomatology.   Her blood pressure at home was running in the 140 - 150 range.    Cholesterol was 118 LDL 59.  She was on amlodipine 7.5 mg , metoprolol 37.5 mg twice a day and lisinopril 20 mg for hypertension.  She has hypothyroidism on levothyroxine 88 mcg.  Not had any bleeding on aspirin and Brilinta.  I discussed with her new hypertensive guidelines and recommended further titration of amlodipine up to 10 mg daily.  I last saw her in February 2020.  She had developed swelling on the increased amlodipine dose and this had been reduced back down to 5 mg with HCTZ 12.5 mg being added to her regimen.  Edema had resolved.  Presently she feels well.  Her blood pressure most of the time typically is over 130.  She denies chest pain or shortness of breath.  She denies palpitations.  She has been taking amlodipine 5 mg, HCTZ 12.5 mg, lisinopril 30 mg and metoprolol 37.5 mg twice a day for blood pressure control.  Past Medical History:  Diagnosis Date  . Anxiety   . Bronchitis 12/2014  . CAD (coronary artery disease), native coronary artery s/p DES to LAD  12/20/2014 01/03/2015  . Hypertension   . Hypothyroidism   . NSTEMI (non-ST elevated myocardial infarction) ([place]) 12/2014  . Osteopenia    hip  . Prediabetes Hgb A1C 6.0 01/03/2015  . Thyroid disease   . VF (ventricular fibrillation) during cath on 12/20/2014 from occluded mid LAD, s/p defib     Past Surgical History:  Procedure Laterality Date  . APPENDECTOMY    . CARDIAC CATHETERIZATION N/A 12/20/2014   Procedure: Left Heart Cath and Coronary Angiography;  Surgeon: Troy Sine, MD;  Location: [facility];  Service: Cardiovascular;  Laterality: N/A;  . CARDIAC  CATHETERIZATION N/A 12/20/2014   Procedure: Coronary Stent Intervention;  Surgeon: Troy Sine, MD;  Location: [facility];  Service: Cardiovascular;  Laterality: N/A;  . CARDIAC CATHETERIZATION N/A 12/24/2014   Procedure: Left Heart Cath and Coronary Angiography;  Surgeon: Jettie Booze, MD;  Location: [facility];  Service: Cardiovascular;  Laterality: N/A;  . CHOLECYSTECTOMY    . CORONARY ANGIOPLASTY WITH STENT PLACEMENT    . TONSILLECTOMY      Allergies  Allergen Reactions  . Codeine Nausea And Vomiting  . Crestor [Rosuvastatin] Other (See Comments)    myaglia  . Lipitor [Atorvastatin] Other (See Comments)    myaglia  . Septra [Sulfamethoxazole-Trimethoprim] Nausea And Vomiting  . Simvastatin Other (See Comments)    myaglia  . Welchol [Colesevelam Hcl] Other (See Comments)    myaglia  . Prednisone Palpitations    Current Outpatient Medications  Medication Sig Dispense Refill  . amLODipine (NORVASC) 5 MG tablet Take 1 tablet (5 mg total) by mouth daily. 90 tablet 3  . aspirin 81 MG tablet Take 81 mg by mouth daily.    Marland Kitchen ELDERBERRY PO Take 50 mg by mouth 2 (two) times daily.    . hydrochlorothiazide (MICROZIDE) 12.5 MG capsule Take 1 capsule (12.5 mg total) by mouth daily. 90 capsule 3  . levothyroxine (SYNTHROID, LEVOTHROID) 88 MCG tablet Take 88 mcg by mouth daily before breakfast.    . lisinopril (ZESTRIL) 40 MG tablet Take 1 tablet (40 mg total) by mouth daily. 90 tablet 3  . metoprolol tartrate (LOPRESSOR) 25 MG tablet TAKE 1 AND 1/2 TABLETS TWO TIMES DAILY 270 tablet 3  . nitroGLYCERIN (NITROSTAT) 0.4 MG SL tablet Place 1 tablet (0.4 mg total) under the tongue every 5 (five) minutes x 3 doses as needed for chest pain. 100 tablet 0  . rosuvastatin (CRESTOR) 20 MG tablet Take 1 tablet (20 mg total) by mouth daily. 30 tablet 5  . ticagrelor (BRILINTA) 60 MG TABS tablet Take 1 tablet (60 mg total) by mouth 2 (two) times daily. 180 tablet 3  . vitamin  B-12 (CYANOCOBALAMIN) 500 MCG tablet Take 500 mcg by mouth daily.     No current facility-administered medications for this visit.     Social History   Socioeconomic History  . Marital status: Married    Spouse name: Not on file  . Number of children: Not on file  . Years of education: Not on file  . Highest education level: Not on file  Occupational History  . Not on file  Social Needs  . Financial resource strain: Not on file  . Food insecurity    Worry: Not on file    Inability: Not on file  . Transportation needs    Medical: Not on file    Non-medical: Not on file  Tobacco Use  . Smoking status: Never Smoker  .  Smokeless tobacco: Never Used  Substance and Sexual Activity  . Alcohol use: No    Alcohol/week: 0.0 standard drinks  . Drug use: No  . Sexual activity: Not on file  Lifestyle  . Physical activity    Days per week: Not on file    Minutes per session: Not on file  . Stress: Not on file  Relationships  . Social Herbalist on phone: Not on file    Gets together: Not on file    Attends religious service: Not on file    Active member of club or organization: Not on file    Attends meetings of clubs or organizations: Not on file    Relationship status: Not on file  . Intimate partner violence    Fear of current or ex partner: Not on file    Emotionally abused: Not on file    Physically abused: Not on file    Forced sexual activity: Not on file  Other Topics Concern  . Not on file  Social History Narrative  . Not on file    Family History  Problem Relation Age of Onset  . Hypertension Mother   . Hypertension Father   . Hypertension Sister   . Cancer Sister   . Hypertension Brother   . Cancer Brother   . CVA Brother   . Transient ischemic attack Sister     ROS General: Negative; No fevers, chills, or night sweats HEENT: Negative; No changes in vision or hearing, sinus congestion, difficulty swallowing Pulmonary: Negative; No cough,  wheezing, shortness of breath, hemoptysis Cardiovascular: See HPI: No chest pain, presyncope, syncope, palpatations GI: Negative; No nausea, vomiting, diarrhea, or abdominal pain GU: Negative; No dysuria, hematuria, or difficulty voiding Musculoskeletal: Negative; no myalgias, joint pain, or weakness Hematologic: Negative; no easy bruising, bleeding Endocrine: Negative; no heat/cold intolerance; no diabetes, Neuro: Negative; no changes in balance, headaches Skin: Negative; No rashes or skin lesions Psychiatric: Negative; No behavioral problems, depression Sleep: Negative; No snoring,  daytime sleepiness, hypersomnolence, bruxism, restless legs, hypnogognic hallucinations. Other comprehensive 14 point system review is negative   Physical Exam BP (!) 148/70   Pulse 64   Temp 98.2 F (36.8 C)   Ht 5' (1.524 m)   Wt 155 lb (70.3 kg)   SpO2 98%   BMI 30.27 kg/m    Repeat blood pressure by me initially was 140/70 and on repeat was 134/70  Wt Readings from Last 3 Encounters:  10/25/18 155 lb (70.3 kg)  03/28/18 155 lb (70.3 kg)  03/18/18 157 lb 10.1 oz (71.5 kg)   General: Alert, oriented, no distress.  Skin: normal turgor, no rashes, warm and dry HEENT: Normocephalic, atraumatic. Pupils equal round and reactive to light; sclera anicteric; extraocular muscles intact;  Nose without nasal septal hypertrophy Mouth/Parynx benign; Mallinpatti scale 2 Neck: No JVD, no carotid bruits; normal carotid upstroke Lungs: clear to ausculatation and percussion; no wheezing or rales Chest wall: without tenderness to palpitation Heart: PMI not displaced, RRR, s1 s2 normal, 1/6 systolic murmur, no diastolic murmur, no rubs, gallops, thrills, or heaves Abdomen: soft, nontender; no hepatosplenomehaly, BS+; abdominal aorta nontender and not dilated by palpation. Back: no CVA tenderness Pulses 2+ Musculoskeletal: full range of motion, normal strength, no joint deformities Extremities: no clubbing  cyanosis or edema, Homan's sign negative  Neurologic: grossly nonfocal; Cranial nerves grossly wnl Psychologic: Normal mood and affect   ECG (independently read by me): Normal sinus rhythm at 62 bpm.  No  ectopy.  QTc interval 472 ms.  February 2020 ECG (independently read by me): Normal sinus rhythm at 61 bpm.  No ectopy.  Normal intervals.  May 2019 ECG (independently read by me): Normal sinus rhythm at 65 bpm.  No ectopy.  Normal intervals.  November 2018 ECG (independently read by me): Sinus bradycardia 57 bpm.  Normal intervals.  No ectopy.  No ST segment changes.  March 2018 ECG (independently read by me): Normal sinus rhythm at 60 bpm.  Preserved R waves inferiorly with small nondiagnostic Q waves in the 3  11/26/2015 ECG (independently read by me): Sinus bradycardia 59 bpm.  No ECG evidence of her prior MI.  Preserved R waves inferiorly with small Q wave in 3.  ECG (independently read by me):  Sinus bradycardia 59 bpm.  No ectopy.  No ECG criteria for her MI.  LABS:  BMP Latest Ref Rng & Units 03/28/2018 03/18/2018 06/15/2017  Glucose 70 - 99 mg/dL 136(H) 100(H) 98  BUN 8 - 23 mg/dL '9 11 8  '$ Creatinine 0.44 - 1.00 mg/dL 0.84 0.72 0.63  BUN/Creat Ratio 12 - 28 - 15 13  Sodium 135 - 145 mmol/L 139 142 141  Potassium 3.5 - 5.1 mmol/L 3.4(L) 4.2 4.4  Chloride 98 - 111 mmol/L 102 100 101  CO2 22 - 32 mmol/L '24 25 27  '$ Calcium 8.9 - 10.3 mg/dL 9.3 9.9 9.8     Hepatic Function Latest Ref Rng & Units 03/28/2018 03/18/2018 04/20/2016  Total Protein 6.5 - 8.1 g/dL 7.3 7.0 6.8  Albumin 3.5 - 5.0 g/dL 3.9 4.5 4.3  AST 15 - 41 U/L 37 20 20  ALT 0 - 44 U/L '30 15 14  '$ Alk Phosphatase 38 - 126 U/L 62 71 67  Total Bilirubin 0.3 - 1.2 mg/dL 1.0 1.6(H) 1.2    CBC Latest Ref Rng & Units 03/28/2018 03/18/2018 06/15/2017  WBC 4.0 - 10.5 K/uL 7.4 9.4 6.0  Hemoglobin 12.0 - 15.0 g/dL 12.2 12.7 13.1  Hematocrit 36.0 - 46.0 % 37.5 36.5 38.1  Platelets 150 - 400 K/uL 242 295 311   Lab Results   Component Value Date   MCV 89.1 03/28/2018   MCV 86 03/18/2018   MCV 87 06/15/2017    Lab Results  Component Value Date   TSH 1.490 03/18/2018    BNP    Component Value Date/Time   BNP 552.5 (H) 01/03/2015 1000    ProBNP No results found for: PROBNP   Lipid Panel     Component Value Date/Time   CHOL 111 03/18/2018 0907   TRIG 81 03/18/2018 0907   HDL 46 03/18/2018 0907   CHOLHDL 2.4 03/18/2018 0907   CHOLHDL 2.8 04/20/2016 1039   VLDL 21 04/20/2016 1039   LDLCALC 49 03/18/2018 0907     RADIOLOGY: No results found.  IMPRESSION: 1. History of NSTEMI (non-ST elevated myocardial infarction) ([place])   2. CAD S/P LAD DES 12/20/14   3. Essential hypertension   4. Hyperlipidemia LDL goal <70   5. Hypothyroidism, unspecified type     ASSESSMENT AND PLAN:  Ms. Rajanee Schuelke is a 78 year-old female who suffered a non-ST segment elevation MI in November 2016 and underwent successful stenting of a subtotally occluded LAD extending proximally into the mid vessel. She developed VT/VF requiring defibrillation in the laboratory.  At the time she was also felt to have a possible ulcerated plaque in the distal ramus immediate vessel, which ultimately cleared with anticoagulation therapy.  Since her initial  presentation, she has been free of recurrent anginal symptomatology.  She took aspirin and Brilinta 90 mg bid for 1 year and ultimately was switched to reduced dose Brilinta at 60 mg twice a day, which she has tolerated well.  In the past I had discussed the possibility of switching to clopidogrel but she preferred to stay on Brilinta since she has been doing exceptionally well on this therapy.  Her blood pressure today is mildly elevated and she states at home typically her blood pressure usually is over 248 systolically.  As result, I have recommended slight titration of her lisinopril from 30 mg up to milligrams daily.  She will continue amlodipine 5 mg, HCTZ 12.5 mg, in addition to  metoprolol 37.5 mg twice a day.  She continues to be on rosuvastatin 20 mg for hyperlipidemia with target LDL less than 70.  LDL cholesterol was 49 on March 18, 2018.  She sees Dr. Carol Ada for her primary care and will be seeing her again in February 2021.  As long as she remains stable I will see her in 1 year for reevaluation or sooner problems arise.   Time spent: 25 minutes Troy Sine, MD, [facility]  10/27/2018 12:03 PM

## 2018-10-27 ENCOUNTER — Encounter: Payer: Self-pay | Admitting: Cardiovascular Disease

## 2018-11-22 DIAGNOSIS — Z23 Encounter for immunization: Secondary | ICD-10-CM | POA: Diagnosis not present

## 2018-12-06 ENCOUNTER — Telehealth: Payer: Self-pay | Admitting: Cardiovascular Disease

## 2018-12-06 ENCOUNTER — Other Ambulatory Visit: Payer: Self-pay | Admitting: Cardiovascular Disease

## 2018-12-06 NOTE — Telephone Encounter (Signed)
New Message  Patient calling the office for samples of medication:   1.  What medication and dosage are you requesting samples for? ticagrelor (BRILINTA) 60 MG TABS tablet Take 1 tablet (60 mg total) by mouth 2 (two) times daily.  2.  Are you currently out of this medication? Yes

## 2018-12-07 NOTE — Telephone Encounter (Signed)
Medication samples have been provided to the patient.  Drug name: Brilinta 60 mg Qty: 3 boxes  LOT: [id number]  Exp.Date: 02/2021  Samples left at front desk for patient pick-up. Patient notified.

## 2019-01-10 ENCOUNTER — Telehealth: Payer: Self-pay | Admitting: Cardiovascular Disease

## 2019-01-10 NOTE — Telephone Encounter (Signed)
° ° °  Patient calling the office for samples of medication:   1.  What medication and dosage are you requesting samples for? Brilinta  2.  Are you currently out of this medication? no

## 2019-01-10 NOTE — Telephone Encounter (Signed)
At the front desk-pt notified

## 2019-01-31 DIAGNOSIS — E039 Hypothyroidism, unspecified: Secondary | ICD-10-CM | POA: Diagnosis not present

## 2019-01-31 DIAGNOSIS — R1110 Vomiting, unspecified: Secondary | ICD-10-CM | POA: Diagnosis not present

## 2019-02-01 DIAGNOSIS — R1110 Vomiting, unspecified: Secondary | ICD-10-CM | POA: Diagnosis not present

## 2019-02-01 DIAGNOSIS — Z03818 Encounter for observation for suspected exposure to other biological agents ruled out: Secondary | ICD-10-CM | POA: Diagnosis not present

## 2019-02-17 NOTE — Telephone Encounter (Signed)
Patient calling to see if her samples will still be at the front desk for her. She would like to pick them up today.

## 2019-02-17 NOTE — Telephone Encounter (Signed)
Let pt know her samples are still up at front and she can pick them up

## 2019-03-16 DIAGNOSIS — Z1211 Encounter for screening for malignant neoplasm of colon: Secondary | ICD-10-CM | POA: Diagnosis not present

## 2019-03-16 DIAGNOSIS — E039 Hypothyroidism, unspecified: Secondary | ICD-10-CM | POA: Diagnosis not present

## 2019-03-16 DIAGNOSIS — Z0000 Encounter for general adult medical examination without abnormal findings: Secondary | ICD-10-CM | POA: Diagnosis not present

## 2019-03-16 DIAGNOSIS — I2510 Atherosclerotic heart disease of native coronary artery without angina pectoris: Secondary | ICD-10-CM | POA: Diagnosis not present

## 2019-03-16 DIAGNOSIS — I10 Essential (primary) hypertension: Secondary | ICD-10-CM | POA: Diagnosis not present

## 2019-03-16 DIAGNOSIS — Z1389 Encounter for screening for other disorder: Secondary | ICD-10-CM | POA: Diagnosis not present

## 2019-03-16 DIAGNOSIS — E782 Mixed hyperlipidemia: Secondary | ICD-10-CM | POA: Diagnosis not present

## 2019-03-26 ENCOUNTER — Ambulatory Visit: Payer: Medicare HMO | Attending: Internal Medicine

## 2019-03-26 DIAGNOSIS — Z23 Encounter for immunization: Secondary | ICD-10-CM | POA: Insufficient documentation

## 2019-03-26 NOTE — Progress Notes (Signed)
   Covid-19 Vaccination Clinic  Name:  Cathy Reilly    MRN: [id number] DOB: 04/13/1940  03/26/2019  Cathy Reilly was observed post Covid-19 immunization for 30 minutes based on pre-vaccination screening without incidence. She was provided with Vaccine Information Sheet and instruction to access the V-Safe system.   Cathy Reilly was instructed to call 911 with any severe reactions post vaccine: Marland Kitchen Difficulty breathing  . Swelling of your face and throat  . A fast heartbeat  . A bad rash all over your body  . Dizziness and weakness    Immunizations Administered    Name Date Dose VIS Date Route   Pfizer COVID-19 Vaccine 03/26/2019  2:53 PM 0.3 mL 01/13/2019 Intramuscular   Manufacturer: [place]   Lot: [id number]   [place]: [id number]

## 2019-03-29 ENCOUNTER — Other Ambulatory Visit: Payer: Self-pay | Admitting: Cardiovascular Disease

## 2019-04-11 DIAGNOSIS — Z1211 Encounter for screening for malignant neoplasm of colon: Secondary | ICD-10-CM | POA: Diagnosis not present

## 2019-04-19 ENCOUNTER — Ambulatory Visit: Payer: Medicare HMO | Attending: Internal Medicine

## 2019-04-19 DIAGNOSIS — Z23 Encounter for immunization: Secondary | ICD-10-CM

## 2019-04-19 NOTE — Progress Notes (Signed)
   Covid-19 Vaccination Clinic  Name:  Cathy Reilly    MRN: [id number] DOB: 17-Jul-1940  04/19/2019  Cathy Reilly was observed post Covid-19 immunization for 15 minutes without incident. She was provided with Vaccine Information Sheet and instruction to access the V-Safe system.   Cathy Reilly was instructed to call 911 with any severe reactions post vaccine: Marland Kitchen Difficulty breathing  . Swelling of face and throat  . A fast heartbeat  . A bad rash all over body  . Dizziness and weakness   Immunizations Administered    Name Date Dose VIS Date Route   Pfizer COVID-19 Vaccine 04/19/2019  9:40 AM 0.3 mL 01/13/2019 Intramuscular   Manufacturer: [place]   Lot: [id number]   [place]: [id number]

## 2019-04-25 ENCOUNTER — Other Ambulatory Visit: Payer: Self-pay | Admitting: Cardiovascular Disease

## 2019-05-01 ENCOUNTER — Telehealth: Payer: Self-pay | Admitting: Cardiovascular Disease

## 2019-05-01 NOTE — Telephone Encounter (Signed)
Samples at the front desk Pt notified 

## 2019-05-01 NOTE — Telephone Encounter (Signed)
New Message   Patient calling the office for samples of medication:   1.  What medication and dosage are you requesting samples for? Brilinta 60 mg   2.  Are you currently out of this medication? No

## 2019-06-22 ENCOUNTER — Telehealth: Payer: Self-pay | Admitting: Cardiovascular Disease

## 2019-06-22 NOTE — Telephone Encounter (Signed)
Patient calling the office for samples of medication:   1.  What medication and dosage are you requesting samples for? BRILINTA 60 MG TABS tablet  2.  Are you currently out of this medication?  No

## 2019-06-22 NOTE — Telephone Encounter (Signed)
PT AWARE SAMPLES LEFT AT FRONT DESK .Adonis Housekeeper

## 2019-06-28 ENCOUNTER — Telehealth: Payer: Self-pay | Admitting: Cardiovascular Disease

## 2019-06-28 NOTE — Telephone Encounter (Signed)
Pt was told to call her dentist office  And have them to call or send a clearance

## 2019-06-28 NOTE — Telephone Encounter (Signed)
Will be on the lookout for clearance and have it filled out- once we receive it. - not currently here.  Thanks!

## 2019-07-07 ENCOUNTER — Telehealth: Payer: Self-pay

## 2019-07-07 NOTE — Telephone Encounter (Signed)
   Primary Cardiologist: Shelva Majestic, MD  Chart reviewed as part of pre-operative protocol coverage.   Simple dental extractions are considered low risk procedures per guidelines and generally do not require any specific cardiac clearance. It is also generally accepted that for simple extractions and dental cleanings, there is no need to interrupt blood thinner therapy.  SBE prophylaxis is not required for the patient.  I will route this recommendation to the requesting party via Epic fax function and remove from pre-op pool.  Please call with questions.  [place], [place] 07/07/2019, 6:03 PM

## 2019-07-07 NOTE — Telephone Encounter (Signed)
° °  [facility] Pre-operative Risk Assessment    HEARTCARE STAFF: - Please ensure there is not already an duplicate clearance open for this procedure. - Under Visit Info/Reason for Call, type in Other and utilize the format Clearance MM/DD/YY or Clearance TBD. Do not use dashes or single digits. - If request is for dental extraction, please clarify the # of teeth to be extracted.  Request for surgical clearance:  1. What type of surgery is being performed? Extraction of tooth #27  2. When is this surgery scheduled? TBD   3. What type of clearance is required (medical clearance vs. Pharmacy clearance to hold med vs. Both)? both  4. Are there any medications that need to be held prior to surgery and how long? aspirin, brillinta   5. Practice name and name of physician performing surgery? [facility]   6. What is the office phone number? [phone]   7.   What is the office fax number?  [phone]  8.   Anesthesia type (None, local, MAC, general) ? unknown   Sherrie Mustache 07/07/2019, 4:35 PM  _________________________________________________________________   (provider comments below)

## 2019-07-13 ENCOUNTER — Other Ambulatory Visit: Payer: Self-pay | Admitting: Cardiovascular Disease

## 2019-07-19 NOTE — Telephone Encounter (Signed)
Follow up   Pt is calling she said she didn't have time to pick up her Brilinta samples and would like to know if it's still available and she can pick it up tomorrow

## 2019-07-19 NOTE — Telephone Encounter (Signed)
Called patient-  Old samples were placed back in the bin, will make new samples and put up front. Patient aware.

## 2019-07-31 ENCOUNTER — Telehealth: Payer: Self-pay | Admitting: Cardiovascular Disease

## 2019-07-31 NOTE — Telephone Encounter (Signed)
I s/w the dental office and confirmed that it is only 1 tooth to be extracted # 27. I will update the pre op provider today.

## 2019-07-31 NOTE — Telephone Encounter (Signed)
° °  Primary Cardiologist: Shelva Majestic, MD  Chart reviewed as part of pre-operative protocol coverage. Callback, please find out how many extractions. If only 1-2, appears there was a clearance already faxed on 07/07/19 for this. Agree she does not need SBE ppx based on cardiac hx. ThxCharlie Pitter, PA-C 07/31/2019, 3:17 PM

## 2019-07-31 NOTE — Telephone Encounter (Signed)
I s/w [facility] office and confirmed the fax is correct. I will re-fax notes.

## 2019-07-31 NOTE — Telephone Encounter (Signed)
OK, thanks! Please re-fax duplicate clearance from 07/07/19 as this still applies - may want to verify fax # since they must not have gotten the first one. Please let their office verbally know since it's only a extraction of 1 tooth, we do not typically recommend holding blood thinners, which is outlined in Hao's note as well. Will remove from pre-op APP pool. Amahd Morino PA-C

## 2019-07-31 NOTE — Telephone Encounter (Signed)
Confirmed fax # with [facility].

## 2019-07-31 NOTE — Telephone Encounter (Signed)
   1. What dental office are you calling from? Dr. Darnelle Maffucci [facility]'s office  2. What is your office phone number? [phone]  3. What is your fax number? [phone]  4. What type of procedure is the patient having performed? extractions  5. What date is procedure scheduled or is the patient there now? TBD (if the patient is at the dentist's office question goes to their cardiologist if he/she is in the office.  If not, question should go to the DOD).   6. What is your question (ex. Antibiotics prior to procedure, holding medication-we need to know how long dentist wants pt to hold med)? Office would to know if the patient needs to stop blood thinners prior to her extractions.

## 2019-08-01 DIAGNOSIS — E039 Hypothyroidism, unspecified: Secondary | ICD-10-CM | POA: Diagnosis not present

## 2019-08-01 DIAGNOSIS — I10 Essential (primary) hypertension: Secondary | ICD-10-CM | POA: Diagnosis not present

## 2019-08-01 DIAGNOSIS — E782 Mixed hyperlipidemia: Secondary | ICD-10-CM | POA: Diagnosis not present

## 2019-08-01 DIAGNOSIS — M8580 Other specified disorders of bone density and structure, unspecified site: Secondary | ICD-10-CM | POA: Diagnosis not present

## 2019-08-01 DIAGNOSIS — I214 Non-ST elevation (NSTEMI) myocardial infarction: Secondary | ICD-10-CM | POA: Diagnosis not present

## 2019-08-01 DIAGNOSIS — I2510 Atherosclerotic heart disease of native coronary artery without angina pectoris: Secondary | ICD-10-CM | POA: Diagnosis not present

## 2019-08-02 ENCOUNTER — Other Ambulatory Visit: Payer: Self-pay | Admitting: Cardiovascular Disease

## 2019-08-08 ENCOUNTER — Telehealth: Payer: Self-pay | Admitting: Cardiovascular Disease

## 2019-08-08 NOTE — Telephone Encounter (Signed)
° ° °  Patient calling the office for samples of medication:   1.  What medication and dosage are you requesting samples for?   BRILINTA 60 MG TABS tablet     2.  Are you currently out of this medication? Yes

## 2019-08-08 NOTE — Telephone Encounter (Signed)
Inform pt that samples are available for pick up at the front desk. Pt verbalized understanding.  Brilinta 60 mg Qty: 2 boxes Lot # [id number] Exp: 4/23

## 2019-08-16 ENCOUNTER — Other Ambulatory Visit: Payer: Self-pay | Admitting: Cardiovascular Disease

## 2019-08-29 ENCOUNTER — Emergency Department (HOSPITAL_BASED_OUTPATIENT_CLINIC_OR_DEPARTMENT_OTHER)
Admission: EM | Admit: 2019-08-29 | Discharge: 2019-08-29 | Disposition: A | Discharge status: Home / Self Care | Payer: Medicare HMO | Attending: Emergency Medicine | Admitting: Emergency Medicine

## 2019-08-29 ENCOUNTER — Telehealth: Payer: Self-pay | Admitting: Cardiovascular Disease

## 2019-08-29 ENCOUNTER — Other Ambulatory Visit: Payer: Self-pay

## 2019-08-29 ENCOUNTER — Emergency Department (HOSPITAL_BASED_OUTPATIENT_CLINIC_OR_DEPARTMENT_OTHER): Payer: Medicare HMO

## 2019-08-29 ENCOUNTER — Encounter (HOSPITAL_BASED_OUTPATIENT_CLINIC_OR_DEPARTMENT_OTHER): Payer: Self-pay | Admitting: Emergency Medicine

## 2019-08-29 DIAGNOSIS — I5031 Acute diastolic (congestive) heart failure: Secondary | ICD-10-CM | POA: Insufficient documentation

## 2019-08-29 DIAGNOSIS — R079 Chest pain, unspecified: Secondary | ICD-10-CM

## 2019-08-29 DIAGNOSIS — R072 Precordial pain: Secondary | ICD-10-CM | POA: Insufficient documentation

## 2019-08-29 DIAGNOSIS — Z7982 Long term (current) use of aspirin: Secondary | ICD-10-CM | POA: Insufficient documentation

## 2019-08-29 DIAGNOSIS — I110 Hypertensive heart disease with heart failure: Secondary | ICD-10-CM | POA: Insufficient documentation

## 2019-08-29 DIAGNOSIS — E039 Hypothyroidism, unspecified: Secondary | ICD-10-CM | POA: Insufficient documentation

## 2019-08-29 DIAGNOSIS — Z79899 Other long term (current) drug therapy: Secondary | ICD-10-CM | POA: Insufficient documentation

## 2019-08-29 DIAGNOSIS — I2510 Atherosclerotic heart disease of native coronary artery without angina pectoris: Secondary | ICD-10-CM | POA: Diagnosis not present

## 2019-08-29 LAB — CBC
HCT: 36.8 % (ref 36.0–46.0)
Hemoglobin: 12.4 g/dL (ref 12.0–15.0)
MCH: 29.8 pg (ref 26.0–34.0)
MCHC: 33.7 g/dL (ref 30.0–36.0)
MCV: 88.5 fL (ref 80.0–100.0)
Platelets: 275 10*3/uL (ref 150–400)
RBC: 4.16 MIL/uL (ref 3.87–5.11)
RDW: 12.2 % (ref 11.5–15.5)
WBC: 8.5 10*3/uL (ref 4.0–10.5)
nRBC: 0 % (ref 0.0–0.2)

## 2019-08-29 LAB — BASIC METABOLIC PANEL
Anion gap: 9 (ref 5–15)
BUN: 28 mg/dL — ABNORMAL HIGH (ref 8–23)
CO2: 29 mmol/L (ref 22–32)
Calcium: 9.2 mg/dL (ref 8.9–10.3)
Chloride: 97 mmol/L — ABNORMAL LOW (ref 98–111)
Creatinine, Ser: 0.67 mg/dL (ref 0.44–1.00)
GFR calc Af Amer: >60 mL/min (ref >60)
GFR calc non Af Amer: >60 mL/min (ref >60)
Glucose, Bld: 116 mg/dL — ABNORMAL HIGH (ref 70–99)
Potassium: 3.2 mmol/L — ABNORMAL LOW (ref 3.5–5.1)
Sodium: 135 mmol/L (ref 135–145)

## 2019-08-29 LAB — TROPONIN I (HIGH SENSITIVITY)
Troponin I (High Sensitivity): 4 ng/L (ref <18)
Troponin I (High Sensitivity): 4 ng/L (ref <18)

## 2019-08-29 MED ORDER — NITROGLYCERIN 0.4 MG SL SUBL: 0.4000 mg | SUBLINGUAL_TABLET | SUBLINGUAL | 1 refills | Status: AC | PRN

## 2019-08-29 MED ORDER — SODIUM CHLORIDE 0.9% FLUSH
3.0000 mL | Freq: Once | INTRAVENOUS | Status: DC
  Filled 2019-08-29: qty 3

## 2019-08-29 NOTE — Telephone Encounter (Signed)
ackowledged 

## 2019-08-29 NOTE — ED Triage Notes (Signed)
Epigastric pain at 12.  Took 2 nitroglycerin sl and pain was relieved.

## 2019-08-29 NOTE — Telephone Encounter (Signed)
Returned the call to the patient. She stated that she had chest pain today that lasted around 15-20 minutes. The pain started around her chest/rib area and radiated to her back. She took two nitroglycerin and stated that the pain went away after the second one. The nitroglycerin was expired (from 2016) so not sure how effective this may have been.    The patient does have a history of a heart attack and is concerned that this may be occurring again. She stated that she will go to the ED for further evaluation.

## 2019-08-29 NOTE — ED Notes (Signed)
ED Provider at bedside. 

## 2019-08-29 NOTE — Telephone Encounter (Signed)
Pt c/o of Chest Pain: STAT if CP now or developed within 24 hours  1. Are you having CP right now? No   2. Are you experiencing any other symptoms (ex. SOB, nausea, vomiting, sweating)? No   3. How long have you been experiencing CP? 15-20 minutes   4. Is your CP continuous or coming and going? Continuous   5. Have you taken Nitroglycerin? Took 2 Nitroglycerin  ?

## 2019-08-29 NOTE — ED Provider Notes (Signed)
[facility] DEPART[facility] Provider Note   CSN: [id number] Arrival date & time: 08/29/19  1351     History Chief Complaint  Patient presents with  . Chest Pain    Cathy Reilly is a 79 y.o. female.  Patient is a 79 year old female with past medical history of coronary artery disease with stent placed in 2016, hypertension, hyperlipidemia.  She presents today for evaluation of chest/epigastric discomfort.  Patient was shopping today when she developed a sudden onset of sharp pain to the epigastrium/lower chest region.  This radiated through to her back and lasted for approximately 15 minutes.  She took 2 nitroglycerin and the pain seemed to go away.  She denies any shortness of breath, nausea, diaphoresis, or radiation of her pain to the arm or jaw.  She denies any recent exertional symptoms.  Her symptoms began at approximately 12 noon.  She denies having consumed any spicy or acidic foods prior to the onset of symptoms.  This feels different than what she experienced with her prior cardiac issues.  The history is provided by the patient.  Chest Pain Pain location:  Substernal area and epigastric Pain quality: sharp   Pain radiates to:  Does not radiate Pain severity:  Moderate Onset quality:  Sudden Duration:  15 minutes Timing:  Constant Progression:  Resolved Chronicity:  New Relieved by:  Nitroglycerin Worsened by:  Nothing Ineffective treatments:  None tried      Past Medical History:  Diagnosis Date  . Anxiety   . Bronchitis 12/2014  . CAD (coronary artery disease), native coronary artery s/p DES to LAD 12/20/2014 01/03/2015  . Hypertension   . Hypothyroidism   . NSTEMI (non-ST elevated myocardial infarction) ([place]) 12/2014  . Osteopenia    hip  . Prediabetes Hgb A1C 6.0 01/03/2015  . Thyroid disease   . VF (ventricular fibrillation) during cath on 12/20/2014 from occluded mid LAD, s/p defib     Patient Active Problem List   Diagnosis Date  Noted  . Chest pain 03/28/2018  . Zoster 03/28/2018  . Hypokalemia 03/28/2018  . Hyperlipidemia LDL goal <70 04/18/2015  . CAD S/P LAD DES 12/20/14 01/10/2015  . Chest pain with moderate risk for cardiac etiology - more consistent with DHF. 01/03/2015  . Atherosclerotic heart disease of native coronary artery with other forms of angina pectoris ([place]) 01/03/2015  . Essential hypertension 01/03/2015  . Hyperlipidemia with target LDL less than 70 01/03/2015  . Hypothyroidism 01/03/2015  . Prediabetes Hgb A1C 6.0 01/03/2015  . Acute diastolic heart failure ([place]) 01/03/2015  . Presence of drug coated stent in LAD coronary artery   . History of NSTEMI (non-ST elevated myocardial infarction) ([place]) 12/20/2014  . VF (ventricular fibrillation) during cath on 12/20/2014 from occluded mid LAD, s/p defib     Past Surgical History:  Procedure Laterality Date  . APPENDECTOMY    . CARDIAC CATHETERIZATION N/A 12/20/2014   Procedure: Left Heart Cath and Coronary Angiography;  Surgeon: Troy Sine, MD;  Location: [facility];  Service: Cardiovascular;  Laterality: N/A;  . CARDIAC CATHETERIZATION N/A 12/20/2014   Procedure: Coronary Stent Intervention;  Surgeon: Troy Sine, MD;  Location: [facility];  Service: Cardiovascular;  Laterality: N/A;  . CARDIAC CATHETERIZATION N/A 12/24/2014   Procedure: Left Heart Cath and Coronary Angiography;  Surgeon: Jettie Booze, MD;  Location: [facility];  Service: Cardiovascular;  Laterality: N/A;  . CHOLECYSTECTOMY    . CORONARY ANGIOPLASTY WITH STENT  PLACEMENT    . TONSILLECTOMY       OB History   No obstetric history on file.     Family History  Problem Relation Age of Onset  . Hypertension Mother   . Hypertension Father   . Hypertension Sister   . Cancer Sister   . Hypertension Brother   . Cancer Brother   . CVA Brother   . Transient ischemic attack Sister     Social History   Tobacco Use  . Smoking status:  Never Smoker  . Smokeless tobacco: Never Used  Vaping Use  . Vaping Use: Never used  Substance Use Topics  . Alcohol use: No    Alcohol/week: 0.0 standard drinks  . Drug use: No    Home Medications Prior to Admission medications   Medication Sig Start Date End Date Taking? Authorizing Provider  amLODipine (NORVASC) 5 MG tablet TAKE 1 TABLET EVERY DAY 08/03/19   Troy Sine, MD  aspirin 81 MG tablet Take 81 mg by mouth daily.    [provider]  BRILINTA 60 MG TABS tablet TAKE 1 TABLET (60 MG TOTAL) BY MOUTH 2 (TWO) TIMES DAILY. 08/17/19   Troy Sine, MD  ELDERBERRY PO Take 50 mg by mouth 2 (two) times daily.    [provider]  hydrochlorothiazide (MICROZIDE) 12.5 MG capsule TAKE 1 CAPSULE (12.5 MG TOTAL) BY MOUTH DAILY. 04/25/19   Troy Sine, MD  levothyroxine (SYNTHROID, LEVOTHROID) 88 MCG tablet Take 88 mcg by mouth daily before breakfast.    [provider]  lisinopril (ZESTRIL) 40 MG tablet Take 1 tablet (40 mg total) by mouth daily. 10/25/18 01/23/19  Troy Sine, MD  metoprolol tartrate (LOPRESSOR) 25 MG tablet TAKE 1 AND 1/2 TABLETS TWO TIMES DAILY 07/13/19   Troy Sine, MD  nitroGLYCERIN (NITROSTAT) 0.4 MG SL tablet Place 1 tablet (0.4 mg total) under the tongue every 5 (five) minutes x 3 doses as needed for chest pain. 08/29/19   Troy Sine, MD  rosuvastatin (CRESTOR) 20 MG tablet Take 1 tablet (20 mg total) by mouth daily. 09/30/18   Troy Sine, MD  vitamin B-12 (CYANOCOBALAMIN) 500 MCG tablet Take 500 mcg by mouth daily.    [provider]    Allergies    Codeine, Crestor [rosuvastatin], Lipitor [atorvastatin], Septra [sulfamethoxazole-trimethoprim], Simvastatin, Welchol [colesevelam hcl], and Prednisone  Review of Systems   Review of Systems  Cardiovascular: Positive for chest pain.  All other systems reviewed and are negative.   Physical Exam Updated Vital Signs BP (!) 127/59   Pulse 65   Temp 98.3 F  (36.8 C) (Oral)   Resp 15   SpO2 100%   Physical Exam Vitals and nursing note reviewed.  Constitutional:      General: She is not in acute distress.    Appearance: She is well-developed. She is not diaphoretic.  HENT:     Head: Normocephalic and atraumatic.  Cardiovascular:     Rate and Rhythm: Normal rate and regular rhythm.     Heart sounds: No murmur heard.  No friction rub. No gallop.   Pulmonary:     Effort: Pulmonary effort is normal. No respiratory distress.     Breath sounds: Normal breath sounds. No wheezing.  Abdominal:     General: Bowel sounds are normal. There is no distension.     Palpations: Abdomen is soft.     Tenderness: There is no abdominal tenderness.  Musculoskeletal:  General: Normal range of motion.     Cervical back: Normal range of motion and neck supple.     Right lower leg: No tenderness. No edema.     Left lower leg: No tenderness. No edema.  Skin:    General: Skin is warm and dry.  Neurological:     Mental Status: She is alert and oriented to person, place, and time.     ED Results / Procedures / Treatments   Labs (all labs ordered are listed, but only abnormal results are displayed) Labs Reviewed  CBC  BASIC METABOLIC PANEL  TROPONIN I (HIGH SENSITIVITY)    EKG EKG Interpretation  Date/Time:  Tuesday August 29 2019 14:14:32 EDT Ventricular Rate:  66 PR Interval:  150 QRS Duration: 90 QT Interval:  440 QTC Calculation: 461 R Axis:   86 Text Interpretation: Normal sinus rhythm Normal ECG No STEMI Confirmed by Octaviano Glow [phone]) on 08/29/2019 2:22:18 PM   Radiology DG Chest 2 View  Result Date: 08/29/2019 CLINICAL DATA:  Chest pain EXAM: CHEST - 2 VIEW COMPARISON:  03/28/2018 FINDINGS: The heart size and mediastinal contours are within normal limits. Both lungs are clear. The visualized skeletal structures are unremarkable. IMPRESSION: No active cardiopulmonary disease. Electronically Signed   By: Franchot Gallo M.D.    On: 08/29/2019 15:19    Procedures Procedures (including critical care time)  Medications Ordered in ED Medications  sodium chloride flush (NS) 0.9 % injection 3 mL (3 mLs Intravenous Not Given 08/29/19 1456)    ED Course  I have reviewed the triage vital signs and the nursing notes.  Pertinent labs & imaging results that were available during my care of the patient were reviewed by me and considered in my medical decision making (see chart for details).    MDM Rules/Calculators/A&P  Patient presenting here with complaints of chest discomfort as described in the HPI.  Her symptoms were brief and she is now symptom-free.  She has had 2 - troponins and unchanged EKG and has remained pain-free while in the ED.  At this point, I feel as though discharge is appropriate with close outpatient follow-up with cardiology and return in the meantime if symptoms worsen or change.  Final Clinical Impression(s) / ED Diagnoses Final diagnoses:  None    Rx / DC Orders ED Discharge Orders    None       Veryl Speak, MD 08/30/19 [phone]

## 2019-08-29 NOTE — ED Notes (Signed)
Dr. Stark Jock at bedside to provide discharge instructions.  Chest pain education and return precautions provided to patient and pt verbalized understanding.

## 2019-09-05 ENCOUNTER — Other Ambulatory Visit: Payer: Self-pay | Admitting: Cardiovascular Disease

## 2019-09-20 DIAGNOSIS — I10 Essential (primary) hypertension: Secondary | ICD-10-CM | POA: Diagnosis not present

## 2019-09-20 DIAGNOSIS — E039 Hypothyroidism, unspecified: Secondary | ICD-10-CM | POA: Diagnosis not present

## 2019-09-20 DIAGNOSIS — I2510 Atherosclerotic heart disease of native coronary artery without angina pectoris: Secondary | ICD-10-CM | POA: Diagnosis not present

## 2019-09-20 DIAGNOSIS — E782 Mixed hyperlipidemia: Secondary | ICD-10-CM | POA: Diagnosis not present

## 2019-10-23 DIAGNOSIS — I214 Non-ST elevation (NSTEMI) myocardial infarction: Secondary | ICD-10-CM | POA: Diagnosis not present

## 2019-10-23 DIAGNOSIS — E039 Hypothyroidism, unspecified: Secondary | ICD-10-CM | POA: Diagnosis not present

## 2019-10-23 DIAGNOSIS — E782 Mixed hyperlipidemia: Secondary | ICD-10-CM | POA: Diagnosis not present

## 2019-10-23 DIAGNOSIS — I2510 Atherosclerotic heart disease of native coronary artery without angina pectoris: Secondary | ICD-10-CM | POA: Diagnosis not present

## 2019-10-23 DIAGNOSIS — I10 Essential (primary) hypertension: Secondary | ICD-10-CM | POA: Diagnosis not present

## 2019-10-23 DIAGNOSIS — M8580 Other specified disorders of bone density and structure, unspecified site: Secondary | ICD-10-CM | POA: Diagnosis not present

## 2019-12-12 ENCOUNTER — Telehealth: Payer: Self-pay | Admitting: Cardiovascular Disease

## 2019-12-12 NOTE — Telephone Encounter (Signed)
Patient calling the office for samples of medication:   1.  What medication and dosage are you requesting samples for? 60 MG TABS tablet  2.  Are you currently out of this medication? No - Patient states she has a 1-2 day supply remaining.

## 2019-12-12 NOTE — Telephone Encounter (Signed)
LM2CB-samples at the front desk

## 2019-12-18 ENCOUNTER — Other Ambulatory Visit: Payer: Self-pay | Admitting: Cardiovascular Disease

## 2019-12-18 NOTE — Telephone Encounter (Signed)
Rx has been sent to the pharmacy electronically. ° °

## 2019-12-25 ENCOUNTER — Encounter: Payer: Self-pay | Admitting: Cardiovascular Disease

## 2019-12-25 ENCOUNTER — Ambulatory Visit: Payer: Medicare HMO | Admitting: Cardiovascular Disease

## 2019-12-25 ENCOUNTER — Other Ambulatory Visit: Payer: Self-pay

## 2019-12-25 VITALS — BP 132/58 | HR 55 | Ht 61.0 in | Wt 158.6 lb

## 2019-12-25 DIAGNOSIS — Z9861 Coronary angioplasty status: Secondary | ICD-10-CM

## 2019-12-25 DIAGNOSIS — I2510 Atherosclerotic heart disease of native coronary artery without angina pectoris: Secondary | ICD-10-CM

## 2019-12-25 DIAGNOSIS — I10 Essential (primary) hypertension: Secondary | ICD-10-CM

## 2019-12-25 DIAGNOSIS — E039 Hypothyroidism, unspecified: Secondary | ICD-10-CM | POA: Diagnosis not present

## 2019-12-25 DIAGNOSIS — E785 Hyperlipidemia, unspecified: Secondary | ICD-10-CM | POA: Diagnosis not present

## 2019-12-25 MED ORDER — TICAGRELOR 60 MG PO TABS: 60.0000 mg | ORAL_TABLET | Freq: Two times a day (BID) | ORAL | 10 refills | Status: DC

## 2019-12-25 NOTE — Progress Notes (Signed)
Patient ID: Cathy Reilly, female   DOB: 1940/06/30, 79 y.o.   MRN: [id number]    PCP: Dr. Carol Ada  HPI: Chery Giusto is a 79 y.o. female who presents to the office today for a 14 month follow up cardiology evaluation.   Ms. Gannett has a history of hypertension and hypothyroidism.  She was admitted in the early morning on 12/19/2014 with a symptom complex suggestive of unstable angina.  She ruled in for non-ST segment elevation MI with initial troponin of 5.  She was referred for cardiac catheterization.  In the catheterization laboratory she developed an episode of VT/VF and required defibrillation.  She underwent successful intervention to a subtotally/totally occluded LAD which had diffuse proximal to mid disease and was successfully stented with a 3033 mm Xience Alpine stent.  She was also felt to have a possible spontaneous dissection with a 90% distal intermediate stenosis ulcerated plaque.  She was maintained on anticoagulation and several days later was brought back to the catheterization laboratory.  Her LAD stent was widely patent and there was resolution of her prior intermediate stenosis.  Subsequent, she has remained stable.  She is participating in phase II cardiac rehabilitation.  She denies recurrent anginal symptoms.  She admits to being active.  She is unaware of any palpitations.  When I saw her in October 2017 she had been on aspirin 81 mg and Brilinta 90 twice a day for dual antiplatelet therapy. I discussed the Pegasus trial data and ultimately switched her to 60 mg, Brilinta twice a day, which she has been taking and tolerating.  When I saw her in November 2018 she was stable and denied any episodes of chest pain or palpitations.  She stays active taking care of her 9 grandchildren and has 5 great-grandchildren.  She has been on amlodipine 5 mg, lisinopril 20 mg, metoprolol 37.5 mg twice a day for hypertension and post MI regimen.  She was on aspirin and low-dose  Brilinta at 60 mg twice a day for dual antiplatelet therapy.  She has been on Crestor mg daily for hyperlipidemia with target LDL less than 70.  She states her blood pressure typically at home ranges in the 130 to 160 range.    When seen in May 2019  she denied any recurrent anginal symptomatology.   Her blood pressure at home was running in the 140 - 150 range.    Cholesterol was 118 LDL 59.  She was on amlodipine 7.5 mg , metoprolol 37.5 mg twice a day and lisinopril 20 mg for hypertension.  She has hypothyroidism on levothyroxine 88 mcg.  Not had any bleeding on aspirin and Brilinta.  I discussed with her new hypertensive guidelines and recommended further titration of amlodipine up to 10 mg daily.  I saw her in February 2020.  She had developed swelling on the increased amlodipine dose and this had been reduced back down to 5 mg with HCTZ 12.5 mg being added to her regimen.  Edema had resolved.  I last saw her in September 2020 at which time she continued to feel well. Her blood pressure most of the time typically is over 130.  She denied chest pain or shortness of breath or palpitations.  She was taking amlodipine 5 mg, HCTZ 12.5 mg, lisinopril 30 mg and metoprolol 37.5 mg twice a day for blood pressure control.  During that evaluation, I recommended slight titration of lisinopril from 20 mg up to 30 mg daily.  Presently, she feels  well.  Her blood pressure improved with the increase lisinopril dose.  Approximately 3 months ago she did experience an episode of midepigastric discomfort which she felt most likely was due to gas.  She denies any exertional chest pain symptomatology.  She denies palpitations.  She continues to be on aspirin and low-dose Brilinta following her VT VF arrest.  She is on amlodipine 5 mg, HCTZ 12.5 mg, lisinopril 30 mg in addition to metoprolol tartrate 37.5 mg twice a day.  She continues to be on rosuvastatin 20 mg daily for hyperlipidemia.  She presents for evaluation.  Past  Medical History:  Diagnosis Date  . Anxiety   . Bronchitis 12/2014  . CAD (coronary artery disease), native coronary artery s/p DES to LAD 12/20/2014 01/03/2015  . Hypertension   . Hypothyroidism   . NSTEMI (non-ST elevated myocardial infarction) ([place]) 12/2014  . Osteopenia    hip  . Prediabetes Hgb A1C 6.0 01/03/2015  . Thyroid disease   . VF (ventricular fibrillation) during cath on 12/20/2014 from occluded mid LAD, s/p defib     Past Surgical History:  Procedure Laterality Date  . APPENDECTOMY    . CARDIAC CATHETERIZATION N/A 12/20/2014   Procedure: Left Heart Cath and Coronary Angiography;  Surgeon: Troy Sine, MD;  Location: [facility];  Service: Cardiovascular;  Laterality: N/A;  . CARDIAC CATHETERIZATION N/A 12/20/2014   Procedure: Coronary Stent Intervention;  Surgeon: Troy Sine, MD;  Location: [facility];  Service: Cardiovascular;  Laterality: N/A;  . CARDIAC CATHETERIZATION N/A 12/24/2014   Procedure: Left Heart Cath and Coronary Angiography;  Surgeon: Jettie Booze, MD;  Location: [facility];  Service: Cardiovascular;  Laterality: N/A;  . CHOLECYSTECTOMY    . CORONARY ANGIOPLASTY WITH STENT PLACEMENT    . TONSILLECTOMY      Allergies  Allergen Reactions  . Codeine Nausea And Vomiting  . Crestor [Rosuvastatin] Other (See Comments)    myaglia  . Lipitor [Atorvastatin] Other (See Comments)    myaglia  . Septra [Sulfamethoxazole-Trimethoprim] Nausea And Vomiting  . Simvastatin Other (See Comments)    myaglia  . Welchol [Colesevelam Hcl] Other (See Comments)    myaglia  . Prednisone Palpitations    Current Outpatient Medications  Medication Sig Dispense Refill  . amLODipine (NORVASC) 5 MG tablet TAKE 1 TABLET EVERY DAY 90 tablet 1  . aspirin 81 MG tablet Take 81 mg by mouth daily.    Marland Kitchen ELDERBERRY PO Take 50 mg by mouth 2 (two) times daily.    . hydrochlorothiazide (MICROZIDE) 12.5 MG capsule TAKE 1 CAPSULE (12.5 MG TOTAL) BY  MOUTH DAILY. 90 capsule 3  . levothyroxine (SYNTHROID, LEVOTHROID) 88 MCG tablet Take 88 mcg by mouth daily before breakfast.    . lisinopril (ZESTRIL) 30 MG tablet Take 30 mg by mouth daily.     . metoprolol tartrate (LOPRESSOR) 25 MG tablet TAKE 1 AND 1/2 TABLETS TWO TIMES DAILY 270 tablet 1  . nitroGLYCERIN (NITROSTAT) 0.4 MG SL tablet Place 1 tablet (0.4 mg total) under the tongue every 5 (five) minutes x 3 doses as needed for chest pain. 25 tablet 1  . rosuvastatin (CRESTOR) 20 MG tablet Take 1 tablet (20 mg total) by mouth daily. 30 tablet 5  . ticagrelor (BRILINTA) 60 MG TABS tablet Take 1 tablet (60 mg total) by mouth 2 (two) times daily. 60 tablet 10  . vitamin B-12 (CYANOCOBALAMIN) 500 MCG tablet Take 500 mcg by mouth daily.  No current facility-administered medications for this visit.    Social History   Socioeconomic History  . Marital status: Married    Spouse name: Not on file  . Number of children: Not on file  . Years of education: Not on file  . Highest education level: Not on file  Occupational History  . Not on file  Tobacco Use  . Smoking status: Never Smoker  . Smokeless tobacco: Never Used  Vaping Use  . Vaping Use: Never used  Substance and Sexual Activity  . Alcohol use: No    Alcohol/week: 0.0 standard drinks  . Drug use: No  . Sexual activity: Not on file  Other Topics Concern  . Not on file  Social History Narrative  . Not on file   Social Determinants of Health   Financial Resource Strain:   . Difficulty of Paying Living Expenses: Not on file  Food Insecurity:   . Worried About Charity fundraiser in the Last Year: Not on file  . Ran Out of Food in the Last Year: Not on file  Transportation Needs:   . Lack of Transportation (Medical): Not on file  . Lack of Transportation (Non-Medical): Not on file  Physical Activity:   . Days of Exercise per Week: Not on file  . Minutes of Exercise per Session: Not on file  Stress:   . Feeling of  Stress : Not on file  Social Connections:   . Frequency of Communication with Friends and Family: Not on file  . Frequency of Social Gatherings with Friends and Family: Not on file  . Attends Religious Services: Not on file  . Active Member of Clubs or Organizations: Not on file  . Attends Archivist Meetings: Not on file  . Marital Status: Not on file  Intimate Partner Violence:   . Fear of Current or Ex-Partner: Not on file  . Emotionally Abused: Not on file  . Physically Abused: Not on file  . Sexually Abused: Not on file    Family History  Problem Relation Age of Onset  . Hypertension Mother   . Hypertension Father   . Hypertension Sister   . Cancer Sister   . Hypertension Brother   . Cancer Brother   . CVA Brother   . Transient ischemic attack Sister     ROS General: Negative; No fevers, chills, or night sweats HEENT: Negative; No changes in vision or hearing, sinus congestion, difficulty swallowing Pulmonary: Negative; No cough, wheezing, shortness of breath, hemoptysis Cardiovascular: See HPI: No chest pain, presyncope, syncope, palpatations GI: Negative; No nausea, vomiting, diarrhea, or abdominal pain GU: Negative; No dysuria, hematuria, or difficulty voiding Musculoskeletal: Negative; no myalgias, joint pain, or weakness Hematologic: Negative; no easy bruising, bleeding Endocrine: Negative; no heat/cold intolerance; no diabetes, Neuro: Negative; no changes in balance, headaches Skin: Negative; No rashes or skin lesions Psychiatric: Negative; No behavioral problems, depression Sleep: Negative; No snoring,  daytime sleepiness, hypersomnolence, bruxism, restless legs, hypnogognic hallucinations. Other comprehensive 14 point system review is negative   Physical Exam BP (!) 132/58   Pulse (!) 55   Ht _0  (1.549 m)   Wt 158 lb 9.6 oz (71.9 kg)   BMI 29.97 kg/m    Repeat blood pressure by me was 122/66  Wt Readings from Last 3 Encounters:    12/25/19 158 lb 9.6 oz (71.9 kg)  10/25/18 155 lb (70.3 kg)  03/28/18 155 lb (70.3 kg)   General: Alert, oriented, no distress.  Skin: normal turgor, no rashes, warm and dry HEENT: Normocephalic, atraumatic. Pupils equal round and reactive to light; sclera anicteric; extraocular muscles intact;  Nose without nasal septal hypertrophy Mouth/Parynx benign; Mallinpatti scale 3 Neck: No JVD, no carotid bruits; normal carotid upstroke Lungs: clear to ausculatation and percussion; no wheezing or rales Chest wall: without tenderness to palpitation Heart: PMI not displaced, RRR, s1 s2 normal, 1/6 systolic murmur, no diastolic murmur, no rubs, gallops, thrills, or heaves Abdomen: soft, nontender; no hepatosplenomehaly, BS+; abdominal aorta nontender and not dilated by palpation. Back: no CVA tenderness Pulses 2+ Musculoskeletal: full range of motion, normal strength, no joint deformities Extremities: no clubbing cyanosis or edema, Homan's sign negative  Neurologic: grossly nonfocal; Cranial nerves grossly wnl Psychologic: Normal mood and affect  ECG (independently read by me): Sinus bradycardia at 55; no ectopy, normal intervals  September 2020 ECG (independently read by me): Normal sinus rhythm at 62 bpm.  No ectopy.  QTc interval 472 ms.  February 2020 ECG (independently read by me): Normal sinus rhythm at 61 bpm.  No ectopy.  Normal intervals.  May 2019 ECG (independently read by me): Normal sinus rhythm at 65 bpm.  No ectopy.  Normal intervals.  November 2018 ECG (independently read by me): Sinus bradycardia 57 bpm.  Normal intervals.  No ectopy.  No ST segment changes.  March 2018 ECG (independently read by me): Normal sinus rhythm at 60 bpm.  Preserved R waves inferiorly with small nondiagnostic Q waves in the 3  11/26/2015 ECG (independently read by me): Sinus bradycardia 59 bpm.  No ECG evidence of her prior MI.  Preserved R waves inferiorly with small Q wave in 3.  ECG  (independently read by me):  Sinus bradycardia 59 bpm.  No ectopy.  No ECG criteria for her MI.  LABS:  BMP Latest Ref Rng & Units 08/29/2019 03/28/2018 03/18/2018  Glucose 70 - 99 mg/dL 116(H) 136(H) 100(H)  BUN 8 - 23 mg/dL 28(H) 9 11  Creatinine 0.44 - 1.00 mg/dL 0.67 0.84 0.72  BUN/Creat Ratio 12 - 28 - - 15  Sodium 135 - 145 mmol/L 135 139 142  Potassium 3.5 - 5.1 mmol/L 3.2(L) 3.4(L) 4.2  Chloride 98 - 111 mmol/L 97(L) 102 100  CO2 22 - 32 mmol/L _0 Calcium 8.9 - 10.3 mg/dL 9.2 9.3 9.9     Hepatic Function Latest Ref Rng & Units 03/28/2018 03/18/2018 04/20/2016  Total Protein 6.5 - 8.1 g/dL 7.3 7.0 6.8  Albumin 3.5 - 5.0 g/dL 3.9 4.5 4.3  AST 15 - 41 U/L 37 20 20  ALT 0 - 44 U/L _1 Alk Phosphatase 38 - 126 U/L 62 71 67  Total Bilirubin 0.3 - 1.2 mg/dL 1.0 1.6(H) 1.2    CBC Latest Ref Rng & Units 08/29/2019 03/28/2018 03/18/2018  WBC 4.0 - 10.5 K/uL 8.5 7.4 9.4  Hemoglobin 12.0 - 15.0 g/dL 12.4 12.2 12.7  Hematocrit 36 - 46 % 36.8 37.5 36.5  Platelets 150 - 400 K/uL 275 242 295   Lab Results  Component Value Date   MCV 88.5 08/29/2019   MCV 89.1 03/28/2018   MCV 86 03/18/2018    Lab Results  Component Value Date   TSH 1.490 03/18/2018    BNP    Component Value Date/Time   BNP 552.5 (H) 01/03/2015 1000    ProBNP No results found for: PROBNP   Lipid Panel     Component Value Date/Time   CHOL 111 03/18/2018  0907   TRIG 81 03/18/2018 0907   HDL 46 03/18/2018 0907   CHOLHDL 2.4 03/18/2018 0907   CHOLHDL 2.8 04/20/2016 1039   VLDL 21 04/20/2016 1039   LDLCALC 49 03/18/2018 0907     RADIOLOGY: No results found.  IMPRESSION:  1. CAD S/P percutaneous coronary angioplasty in setting of non-STEMI/VT VF  arrest November 2016   2. Essential hypertension   3. CAD in native artery   4. Hyperlipidemia LDL goal <70   5. Hypothyroidism, unspecified type     ASSESSMENT AND PLAN:  Ms. Janat Tabbert is a 79 year-old female who suffered a non-ST  segment elevation MI in November 2016 and underwent successful stenting of a subtotally occluded LAD extending proximally into the mid vessel. She developed VT/VF requiring defibrillation in the laboratory.  At the time she was also felt to have a possible ulcerated plaque in the distal ramus immediate vessel, which ultimately cleared with anticoagulation therapy.  Since her initial presentation, she has been free of recurrent anginal symptomatology.  She took aspirin and Brilinta 90 mg bid for 1 year and ultimately was switched to reduced dose Brilinta at 60 mg twice a day, which she has tolerated well.  In the past I had discussed the possibility of switching to clopidogrel but she preferred to stay on Brilinta since she has been doing exceptionally well on this therapy.  At her last office visit I titrated lisinopril for more optimal blood pressure control.  Her blood pressure today is excellent on her regimen now consisting of amlodipine 5 mg, HCTZ 12.5 mg, lisinopril 30 mg, and metoprolol tartrate 37.5 mg twice a day.  Resting pulse is 55.  She is not had any anginal symptoms or palpitations.  She continues to be on rosuvastatin 20 mg for hyperlipidemia.  In February 2021, LDL cholesterol was 61 with total cholesterol 123 triglycerides 81.  She is on levothyroxine for hypothyroidism.  She is followed by Dr. Carol Ada who checks her laboratory.  I reviewed laboratory when she presented to the emergency room in July chest pain was most likely due to gas.  Clinically she is stable on current therapy.  She will be seeing her primary physician in February 2022.  I will see her in 1 year for reevaluation or sooner as needed  Troy Sine, MD, [facility]  12/26/2019 5:25 PM

## 2019-12-25 NOTE — Patient Instructions (Addendum)
Medication Instructions:  No changes   *If you need a refill on your cardiac medications before your next appointment, please call your pharmacy*   Lab Work: Not needed   Testing/Procedures: Not needed   Follow-Up: At [facility], you and your health needs are our priority.  As part of our continuing mission to provide you with exceptional heart care, we have created designated Provider Care Teams.  These Care Teams include your primary Cardiologist (physician) and Advanced Practice Providers (APPs -  Physician Assistants and Nurse Practitioners) who all work together to provide you with the care you need, when you need it.     Your next appointment:   12 month(s)  The format for your next appointment:   In Person  Provider:   Thomas Kelly, MD     

## 2019-12-26 ENCOUNTER — Encounter: Payer: Self-pay | Admitting: Cardiovascular Disease

## 2020-01-02 DIAGNOSIS — H5203 Hypermetropia, bilateral: Secondary | ICD-10-CM | POA: Diagnosis not present

## 2020-01-30 DIAGNOSIS — E039 Hypothyroidism, unspecified: Secondary | ICD-10-CM | POA: Diagnosis not present

## 2020-01-30 DIAGNOSIS — I214 Non-ST elevation (NSTEMI) myocardial infarction: Secondary | ICD-10-CM | POA: Diagnosis not present

## 2020-01-30 DIAGNOSIS — E782 Mixed hyperlipidemia: Secondary | ICD-10-CM | POA: Diagnosis not present

## 2020-01-30 DIAGNOSIS — I10 Essential (primary) hypertension: Secondary | ICD-10-CM | POA: Diagnosis not present

## 2020-01-30 DIAGNOSIS — M8580 Other specified disorders of bone density and structure, unspecified site: Secondary | ICD-10-CM | POA: Diagnosis not present

## 2020-01-30 DIAGNOSIS — I2510 Atherosclerotic heart disease of native coronary artery without angina pectoris: Secondary | ICD-10-CM | POA: Diagnosis not present

## 2020-02-14 DIAGNOSIS — Z20828 Contact with and (suspected) exposure to other viral communicable diseases: Secondary | ICD-10-CM | POA: Diagnosis not present

## 2020-03-04 ENCOUNTER — Telehealth: Payer: Self-pay | Admitting: Cardiovascular Disease

## 2020-03-04 NOTE — Telephone Encounter (Signed)
Spoke with patient and reviewed pharmacists recommendations. Patient verbalized understanding.

## 2020-03-04 NOTE — Telephone Encounter (Signed)
Recommend Tylenol or topical Voltaren gel. If neither of these work to control pt's pain, would advise she follow up with PCP for alternative pain med like tramadol. Agree she should avoid NSAIDs given history of ASCVD and concomitant DAPT.

## 2020-03-04 NOTE — Telephone Encounter (Signed)
Patient is calling stating that she is having pain in her knee and wants to take ibuprofen, she says that she was told not to take this since she has had a heart attack in the past. She would like Dr. Evette Georges suggestion for an alternative. Please advise.

## 2020-03-11 DIAGNOSIS — M25562 Pain in left knee: Secondary | ICD-10-CM | POA: Diagnosis not present

## 2020-03-14 DIAGNOSIS — M1712 Unilateral primary osteoarthritis, left knee: Secondary | ICD-10-CM | POA: Diagnosis not present

## 2020-03-26 ENCOUNTER — Other Ambulatory Visit: Payer: Self-pay

## 2020-03-26 DIAGNOSIS — Z0000 Encounter for general adult medical examination without abnormal findings: Secondary | ICD-10-CM | POA: Diagnosis not present

## 2020-03-26 DIAGNOSIS — M25562 Pain in left knee: Secondary | ICD-10-CM | POA: Diagnosis not present

## 2020-03-26 DIAGNOSIS — Z1389 Encounter for screening for other disorder: Secondary | ICD-10-CM | POA: Diagnosis not present

## 2020-03-26 DIAGNOSIS — E039 Hypothyroidism, unspecified: Secondary | ICD-10-CM | POA: Diagnosis not present

## 2020-03-26 DIAGNOSIS — E782 Mixed hyperlipidemia: Secondary | ICD-10-CM | POA: Diagnosis not present

## 2020-03-26 DIAGNOSIS — I2510 Atherosclerotic heart disease of native coronary artery without angina pectoris: Secondary | ICD-10-CM | POA: Diagnosis not present

## 2020-03-26 DIAGNOSIS — I10 Essential (primary) hypertension: Secondary | ICD-10-CM | POA: Diagnosis not present

## 2020-03-26 MED ORDER — AMLODIPINE BESYLATE 5 MG PO TABS: 5.0000 mg | ORAL_TABLET | Freq: Every day | ORAL | 3 refills | Status: AC

## 2020-03-28 ENCOUNTER — Telehealth: Payer: Self-pay | Admitting: *Deleted

## 2020-03-28 DIAGNOSIS — M1712 Unilateral primary osteoarthritis, left knee: Secondary | ICD-10-CM | POA: Diagnosis not present

## 2020-03-28 NOTE — Telephone Encounter (Signed)
   [facility] Pre-operative Risk Assessment    HEARTCARE STAFF: - Please ensure there is not already an duplicate clearance open for this procedure. - Under Visit Info/Reason for Call, type in Other and utilize the format Clearance MM/DD/YY or Clearance TBD. Do not use dashes or single digits. - If request is for dental extraction, please clarify the # of teeth to be extracted.  Request for surgical clearance:  1. What type of surgery is being performed? Left knee scope   2. When is this surgery scheduled? TBD   3. What type of clearance is required (medical clearance vs. Pharmacy clearance to hold med vs. Both)? both  4. Are there any medications that need to be held prior to surgery and how long?brilinta - asa  5. Practice name and name of physician performing surgery? [facility]   6. What is the office phone number? [phone]   7.   What is the office fax number? [phone]  8.   Anesthesia type (None, local, MAC, general) ? Not listed   Fredia Beets 03/28/2020, 4:06 PM  _________________________________________________________________   (provider comments below)

## 2020-03-29 NOTE — Telephone Encounter (Signed)
Dr. Claiborne Billings to review.  [name] is a pleasant 80 year old female with past medical history of CAD status post DES to proximal to mid LAD in November 2016.  She has not had any ischemic work-up since.  She denies any recent chest pain or worsening shortness of breath.  After 1 year of aspirin Brilinta, her Brilinta was lowered to 60 mg twice a day.  Note, she did have V. tach/V. fib during the cardiac catheterization that required defibrillation.   Talking with the patient, she does not do much strenuous activity.  She thinks she can push herself to walk 2 blocks away from her home if needed.  She denies any recent exertional chest discomfort or shortness of breath.   Dr. Claiborne Billings, would you be okay with clearing the patient to proceed with low risk knee arthroscopy without further work-up.  Also can Mrs. Ridling hold her Brilinta for 5 days prior to the procedure?  If possible, would recommend continue on the aspirin through the procedure.  Please forward your response to [facility] PREOP

## 2020-04-01 NOTE — Telephone Encounter (Signed)
Agree with recommendations from [place], [place].  Okay to proceed with procedure

## 2020-04-02 DIAGNOSIS — H52209 Unspecified astigmatism, unspecified eye: Secondary | ICD-10-CM | POA: Diagnosis not present

## 2020-04-02 DIAGNOSIS — H5203 Hypermetropia, bilateral: Secondary | ICD-10-CM | POA: Diagnosis not present

## 2020-04-02 DIAGNOSIS — H524 Presbyopia: Secondary | ICD-10-CM | POA: Diagnosis not present

## 2020-04-02 NOTE — Telephone Encounter (Signed)
Notes faxed to surgeon. This phone note will be removed from the preop pool. Richardson Dopp, PA-C  04/02/2020 2:52 PM

## 2020-04-02 NOTE — Telephone Encounter (Signed)
   Primary Cardiologist: Shelva Majestic, MD  Chart reviewed as part of pre-operative protocol coverage.   See notes from Dr. Claiborne Billings and Almyra Deforest, PA-C.  Summary of Recommendations: . The patient is at acceptable risk for the planned procedure without further cardiovascular testing.  . The patient may hold Ticagrelor (Brilinta) for 5 days prior to her procedure and resume post op as soon as it is felt to be safe. . The patient should remain on ASA without interruption.    Please call with questions. Richardson Dopp, PA-C 04/02/2020, 2:47 PM

## 2020-04-17 DIAGNOSIS — S83232A Complex tear of medial meniscus, current injury, left knee, initial encounter: Secondary | ICD-10-CM | POA: Diagnosis not present

## 2020-04-17 DIAGNOSIS — M1712 Unilateral primary osteoarthritis, left knee: Secondary | ICD-10-CM | POA: Diagnosis not present

## 2020-04-17 DIAGNOSIS — G8918 Other acute postprocedural pain: Secondary | ICD-10-CM | POA: Diagnosis not present

## 2020-04-17 DIAGNOSIS — S83242A Other tear of medial meniscus, current injury, left knee, initial encounter: Secondary | ICD-10-CM | POA: Diagnosis not present

## 2020-04-17 DIAGNOSIS — S83272A Complex tear of lateral meniscus, current injury, left knee, initial encounter: Secondary | ICD-10-CM | POA: Diagnosis not present

## 2020-04-17 DIAGNOSIS — S83282A Other tear of lateral meniscus, current injury, left knee, initial encounter: Secondary | ICD-10-CM | POA: Diagnosis not present

## 2020-04-18 ENCOUNTER — Telehealth: Payer: Self-pay | Admitting: Cardiovascular Disease

## 2020-04-18 NOTE — Telephone Encounter (Signed)
Pt c/o medication issue:  1. Name of Medication: ticagrelor (BRILINTA) 60 MG TABS tablet  2. How are you currently taking this medication (dosage and times per day)? Has been holding for procedure  3. Are you having a reaction (difficulty breathing--STAT)? no  4. What is your medication issue? Patient states she was holding the medication for a procedure. She would like to know when she can start it again.

## 2020-04-18 NOTE — Telephone Encounter (Signed)
Returned call to patient and advised to start as soon as okay with Psychologist, sport and exercise.  She will contact MD at [facility] to discuss.

## 2020-05-02 DIAGNOSIS — M25562 Pain in left knee: Secondary | ICD-10-CM | POA: Diagnosis not present

## 2020-05-02 DIAGNOSIS — R262 Difficulty in walking, not elsewhere classified: Secondary | ICD-10-CM | POA: Diagnosis not present

## 2020-05-02 DIAGNOSIS — M6281 Muscle weakness (generalized): Secondary | ICD-10-CM | POA: Diagnosis not present

## 2020-05-02 DIAGNOSIS — M25662 Stiffness of left knee, not elsewhere classified: Secondary | ICD-10-CM | POA: Diagnosis not present

## 2020-05-09 DIAGNOSIS — M6281 Muscle weakness (generalized): Secondary | ICD-10-CM | POA: Diagnosis not present

## 2020-05-09 DIAGNOSIS — R262 Difficulty in walking, not elsewhere classified: Secondary | ICD-10-CM | POA: Diagnosis not present

## 2020-05-09 DIAGNOSIS — M25562 Pain in left knee: Secondary | ICD-10-CM | POA: Diagnosis not present

## 2020-05-09 DIAGNOSIS — M25662 Stiffness of left knee, not elsewhere classified: Secondary | ICD-10-CM | POA: Diagnosis not present

## 2020-05-14 DIAGNOSIS — M25562 Pain in left knee: Secondary | ICD-10-CM | POA: Diagnosis not present

## 2020-05-14 DIAGNOSIS — M25662 Stiffness of left knee, not elsewhere classified: Secondary | ICD-10-CM | POA: Diagnosis not present

## 2020-05-14 DIAGNOSIS — R262 Difficulty in walking, not elsewhere classified: Secondary | ICD-10-CM | POA: Diagnosis not present

## 2020-05-14 DIAGNOSIS — M6281 Muscle weakness (generalized): Secondary | ICD-10-CM | POA: Diagnosis not present

## 2020-05-23 DIAGNOSIS — S83282D Other tear of lateral meniscus, current injury, left knee, subsequent encounter: Secondary | ICD-10-CM | POA: Diagnosis not present

## 2020-05-23 DIAGNOSIS — M25562 Pain in left knee: Secondary | ICD-10-CM | POA: Diagnosis not present

## 2020-05-23 DIAGNOSIS — S83242D Other tear of medial meniscus, current injury, left knee, subsequent encounter: Secondary | ICD-10-CM | POA: Diagnosis not present

## 2020-06-06 DIAGNOSIS — M1712 Unilateral primary osteoarthritis, left knee: Secondary | ICD-10-CM | POA: Diagnosis not present

## 2020-06-13 DIAGNOSIS — M1712 Unilateral primary osteoarthritis, left knee: Secondary | ICD-10-CM | POA: Diagnosis not present

## 2020-06-17 ENCOUNTER — Telehealth: Payer: Self-pay | Admitting: Cardiovascular Disease

## 2020-06-17 NOTE — Telephone Encounter (Signed)
Spoke with pt about recommendations per pharmacy. Explained to pt that she can take ibuprofen 600mg  twice a day for 12 days to help with inflammation, beyond that pharmD suggests topical therapy to help. Pt states that she is 2 months post op and would like to try the ibuprofen for 2 days to see if this will improved movement and stiffness of the knee joint. Pt will call back with any other questions. Pt verbalizes understanding.

## 2020-06-17 NOTE — Telephone Encounter (Signed)
Pt c/o medication issue:  1. Name of Medication: ibuprofen  2. How are you currently taking this medication (dosage and times per day)? Not currently taking  3. Are you having a reaction (difficulty breathing--STAT)? no  4. What is your medication issue? Patient states she had knee surgery and wants to know if she can take ibuprofen.

## 2020-06-17 NOTE — Telephone Encounter (Signed)
In general we discourage NSAID use for patients with prior MI.  If this is recommended by the orthopedic surgeon, I usually suggest that they take no more than 600 mg (3 OTC 200 mg tabs), twice daily for 2 days to get inflammation under control, but otherwise use ice and other topical products.  Voltaren gel is okay, or icy hot - but be sure to avoid any surgical sites with topicals.

## 2020-06-20 DIAGNOSIS — M1712 Unilateral primary osteoarthritis, left knee: Secondary | ICD-10-CM | POA: Diagnosis not present

## 2020-06-25 DIAGNOSIS — M2419 Other articular cartilage disorders, other specified site: Secondary | ICD-10-CM | POA: Diagnosis not present

## 2020-06-25 DIAGNOSIS — M93262 Osteochondritis dissecans, left knee: Secondary | ICD-10-CM | POA: Diagnosis not present

## 2020-07-23 DIAGNOSIS — M25562 Pain in left knee: Secondary | ICD-10-CM | POA: Diagnosis not present

## 2020-07-23 DIAGNOSIS — G8929 Other chronic pain: Secondary | ICD-10-CM | POA: Diagnosis not present

## 2020-08-30 DIAGNOSIS — E782 Mixed hyperlipidemia: Secondary | ICD-10-CM | POA: Diagnosis not present

## 2020-08-30 DIAGNOSIS — M8580 Other specified disorders of bone density and structure, unspecified site: Secondary | ICD-10-CM | POA: Diagnosis not present

## 2020-08-30 DIAGNOSIS — I214 Non-ST elevation (NSTEMI) myocardial infarction: Secondary | ICD-10-CM | POA: Diagnosis not present

## 2020-08-30 DIAGNOSIS — E039 Hypothyroidism, unspecified: Secondary | ICD-10-CM | POA: Diagnosis not present

## 2020-08-30 DIAGNOSIS — I2510 Atherosclerotic heart disease of native coronary artery without angina pectoris: Secondary | ICD-10-CM | POA: Diagnosis not present

## 2020-08-30 DIAGNOSIS — I10 Essential (primary) hypertension: Secondary | ICD-10-CM | POA: Diagnosis not present

## 2020-09-17 IMAGING — DX DG CHEST 1V PORT
1 series · 1 of 1 positions shown · non-contrast
Comparison: 01/03/2015

CLINICAL DATA: Chest pain, vomiting

EXAM:
PORTABLE CHEST 1 VIEW

[chest ap]
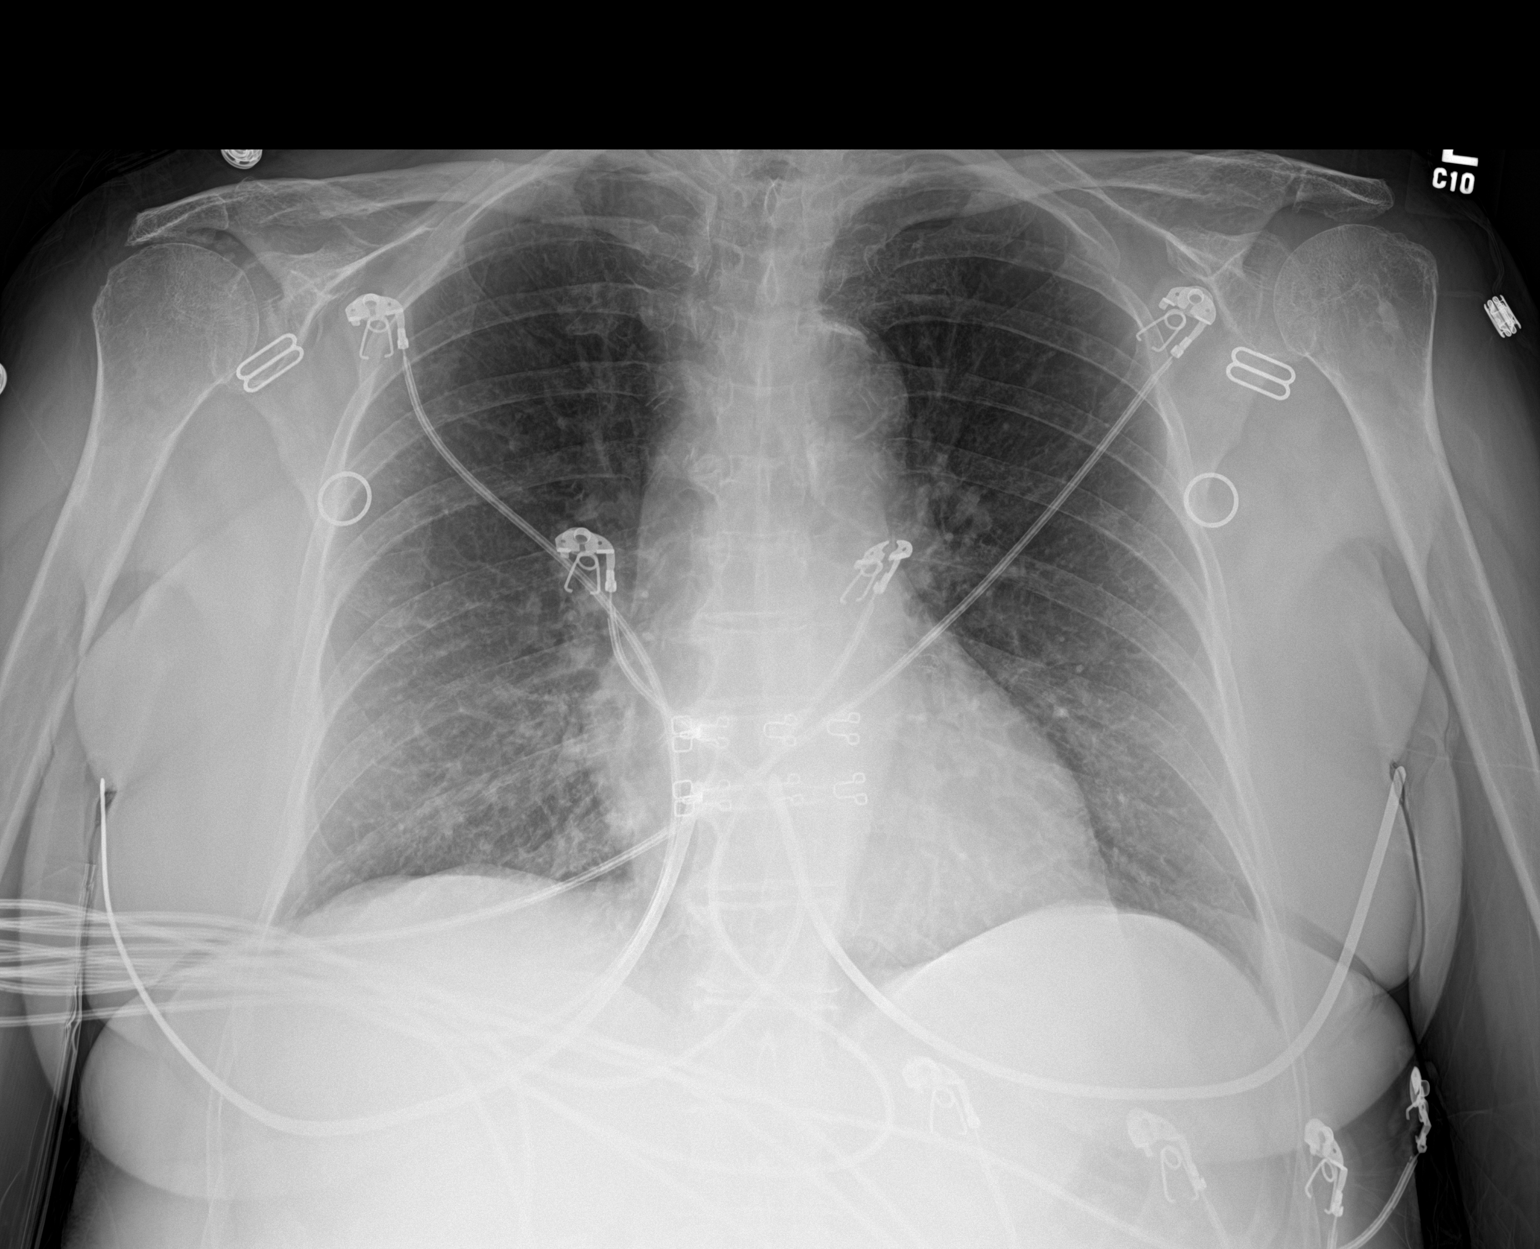

[1 of 1 positions shown; findings below may reference images not displayed]

FINDINGS: Heart and mediastinal contours are within normal limits. No focal
opacities or effusions. No acute bony abnormality.
IMPRESSION: No active disease.

## 2020-09-23 DIAGNOSIS — E039 Hypothyroidism, unspecified: Secondary | ICD-10-CM | POA: Diagnosis not present

## 2020-09-23 DIAGNOSIS — I2510 Atherosclerotic heart disease of native coronary artery without angina pectoris: Secondary | ICD-10-CM | POA: Diagnosis not present

## 2020-09-23 DIAGNOSIS — E782 Mixed hyperlipidemia: Secondary | ICD-10-CM | POA: Diagnosis not present

## 2020-09-23 DIAGNOSIS — I10 Essential (primary) hypertension: Secondary | ICD-10-CM | POA: Diagnosis not present

## 2020-09-25 DIAGNOSIS — E039 Hypothyroidism, unspecified: Secondary | ICD-10-CM | POA: Diagnosis not present

## 2020-09-25 DIAGNOSIS — I214 Non-ST elevation (NSTEMI) myocardial infarction: Secondary | ICD-10-CM | POA: Diagnosis not present

## 2020-09-25 DIAGNOSIS — M8580 Other specified disorders of bone density and structure, unspecified site: Secondary | ICD-10-CM | POA: Diagnosis not present

## 2020-09-25 DIAGNOSIS — I10 Essential (primary) hypertension: Secondary | ICD-10-CM | POA: Diagnosis not present

## 2020-09-25 DIAGNOSIS — E782 Mixed hyperlipidemia: Secondary | ICD-10-CM | POA: Diagnosis not present

## 2020-09-25 DIAGNOSIS — I2510 Atherosclerotic heart disease of native coronary artery without angina pectoris: Secondary | ICD-10-CM | POA: Diagnosis not present

## 2020-09-27 ENCOUNTER — Telehealth: Payer: Self-pay | Admitting: *Deleted

## 2020-09-27 DIAGNOSIS — M93262 Osteochondritis dissecans, left knee: Secondary | ICD-10-CM | POA: Diagnosis not present

## 2020-09-27 NOTE — Telephone Encounter (Signed)
   [facility] Pre-operative Risk Assessment    Patient Name: Cathy Reilly  DOB: 05/30/1940 MRN: [id number]  HEARTCARE STAFF:  - IMPORTANT!!!!!! Under Visit Info/Reason for Call, type in Other and utilize the format Clearance MM/DD/YY or Clearance TBD. Do not use dashes or single digits. - Please review there is not already an duplicate clearance open for this procedure. - If request is for dental extraction, please clarify the # of teeth to be extracted. - If the patient is currently at the dentist's office, call Pre-Op Callback Staff (MA/nurse) to input urgent request.  - If the patient is not currently in the dentist office, please route to the Pre-Op pool.  Request for surgical clearance:  What type of surgery is being performed? TKA  When is this surgery scheduled? TBA  What type of clearance is required (medical clearance vs. Pharmacy clearance to hold med vs. Both)? both  Are there any medications that need to be held prior to surgery and how long? Brilinta-need direction  Practice name and name of physician performing surgery? [facility]  What is the office phone number? [phone]   7.   What is the office fax number? [phone]  8.   Anesthesia type (None, local, MAC, general) ? Not listed   Fredia Beets 09/27/2020, 11:35 AM  _________________________________________________________________   (provider comments below)

## 2020-09-30 NOTE — Telephone Encounter (Signed)
Patient states she is returning a call regarding her clearance.

## 2020-09-30 NOTE — Telephone Encounter (Signed)
    Patient Name: Cathy Reilly  DOB: 04-Jul-1940 MRN: [id number]  Primary Cardiologist: Shelva Majestic, MD  Chart reviewed as part of pre-operative protocol coverage. Patient was last seen by Dr. Claiborne Billings in 12/2019 at which time she was doing well from a cardiac standpoint. Patient was contacted today for further pre-op evaluation at which time she reported doing well since last visit. No chest pain, shortness of breath, orthopnea/PND. She denied any palpitations. She did have one isolated episode of lightheadedness/near syncope this past weekend. This occurred while walking a log distance (about 1 mile) [place] sports [place] when it was very hot. She denies any syncope. It sounds like this was just do to the heat and she may have been a little dehydrated. She states this has never happened before and has not happened since. She denies any other cardiac symptoms at the time. She is able to complete >4.0 METS without any angina. Per Revised Cardiac Risk Index,  she is considered low risk with 0.9% chance of adverse cardiac events given history of CAD/NSTEMI. Given past medical history and time since last visit, based on ACC/AHA guidelines, Leandria Mccosh would be at acceptable risk for the planned procedure without further cardiovascular testing.   Dr. Claiborne Billings previously gave the patient the OK to hold Brilinta for 5 days prior to knee arthroscopy earlier this year. She has had no new cardiac event since that time and does not have a history of stroke. Therefore, OK to use this same recommendation. OK to hold Brilinta 5 days prior to knee surgery but would restart as soon as safely possible postoperatively. Would recommend she remain on Aspirin if possible.  The patient was advised that if she develops new symptoms or recurrent near syncope episode prior to surgery to contact our office to arrange for a follow-up visit, and she verbalized understanding.  I will route this recommendation to the  requesting party via Epic fax function and remove from pre-op pool.  Please call with questions.  Darreld Mclean, PA-C 09/30/2020, 10:53 AM

## 2020-10-22 DIAGNOSIS — M1712 Unilateral primary osteoarthritis, left knee: Secondary | ICD-10-CM | POA: Diagnosis not present

## 2020-11-05 ENCOUNTER — Telehealth: Payer: Self-pay | Admitting: Cardiovascular Disease

## 2020-11-05 DIAGNOSIS — M1712 Unilateral primary osteoarthritis, left knee: Secondary | ICD-10-CM | POA: Diagnosis not present

## 2020-11-05 NOTE — Telephone Encounter (Signed)
Pt c/o medication issue:  1. Name of Medication: ticagrelor (BRILINTA) 60 MG TABS tablet  2. How are you currently taking this medication (dosage and times per day)? Take 1 tablet (60 mg total) by mouth 2 (two) times daily.  3. Are you having a reaction (difficulty breathing--STAT)? no  4. What is your medication issue? Patient has surgery on tomorrow and calling with question concerning this medication

## 2020-11-05 NOTE — Telephone Encounter (Signed)
Left message to call back  

## 2020-11-06 DIAGNOSIS — M1712 Unilateral primary osteoarthritis, left knee: Secondary | ICD-10-CM | POA: Diagnosis not present

## 2020-11-06 DIAGNOSIS — G8918 Other acute postprocedural pain: Secondary | ICD-10-CM | POA: Diagnosis not present

## 2020-11-06 DIAGNOSIS — Z96652 Presence of left artificial knee joint: Secondary | ICD-10-CM | POA: Diagnosis not present

## 2020-11-06 NOTE — Telephone Encounter (Signed)
Called pt to see if someone answered her questions in regards to [place]. No answer at this time left a message.

## 2020-11-07 NOTE — Telephone Encounter (Signed)
Left message for patient to call clinic

## 2020-11-08 DIAGNOSIS — Z96652 Presence of left artificial knee joint: Secondary | ICD-10-CM | POA: Diagnosis not present

## 2020-11-08 DIAGNOSIS — M1712 Unilateral primary osteoarthritis, left knee: Secondary | ICD-10-CM | POA: Diagnosis not present

## 2020-11-12 DIAGNOSIS — I2510 Atherosclerotic heart disease of native coronary artery without angina pectoris: Secondary | ICD-10-CM | POA: Diagnosis not present

## 2020-11-12 DIAGNOSIS — E782 Mixed hyperlipidemia: Secondary | ICD-10-CM | POA: Diagnosis not present

## 2020-11-12 DIAGNOSIS — I10 Essential (primary) hypertension: Secondary | ICD-10-CM | POA: Diagnosis not present

## 2020-11-12 DIAGNOSIS — E039 Hypothyroidism, unspecified: Secondary | ICD-10-CM | POA: Diagnosis not present

## 2020-11-12 DIAGNOSIS — M8580 Other specified disorders of bone density and structure, unspecified site: Secondary | ICD-10-CM | POA: Diagnosis not present

## 2020-11-12 DIAGNOSIS — I214 Non-ST elevation (NSTEMI) myocardial infarction: Secondary | ICD-10-CM | POA: Diagnosis not present

## 2020-11-14 DIAGNOSIS — Z7409 Other reduced mobility: Secondary | ICD-10-CM | POA: Diagnosis not present

## 2020-11-14 DIAGNOSIS — M25462 Effusion, left knee: Secondary | ICD-10-CM | POA: Diagnosis not present

## 2020-11-14 DIAGNOSIS — M25662 Stiffness of left knee, not elsewhere classified: Secondary | ICD-10-CM | POA: Diagnosis not present

## 2020-11-14 DIAGNOSIS — R29898 Other symptoms and signs involving the musculoskeletal system: Secondary | ICD-10-CM | POA: Diagnosis not present

## 2020-11-14 DIAGNOSIS — Z96652 Presence of left artificial knee joint: Secondary | ICD-10-CM | POA: Diagnosis not present

## 2020-11-14 DIAGNOSIS — M25562 Pain in left knee: Secondary | ICD-10-CM | POA: Diagnosis not present

## 2020-11-19 DIAGNOSIS — R29898 Other symptoms and signs involving the musculoskeletal system: Secondary | ICD-10-CM | POA: Diagnosis not present

## 2020-11-19 DIAGNOSIS — M25562 Pain in left knee: Secondary | ICD-10-CM | POA: Diagnosis not present

## 2020-11-19 DIAGNOSIS — Z7409 Other reduced mobility: Secondary | ICD-10-CM | POA: Diagnosis not present

## 2020-11-19 DIAGNOSIS — Z96652 Presence of left artificial knee joint: Secondary | ICD-10-CM | POA: Diagnosis not present

## 2020-11-19 DIAGNOSIS — M25462 Effusion, left knee: Secondary | ICD-10-CM | POA: Diagnosis not present

## 2020-11-19 DIAGNOSIS — M25662 Stiffness of left knee, not elsewhere classified: Secondary | ICD-10-CM | POA: Diagnosis not present

## 2020-11-21 DIAGNOSIS — Z96652 Presence of left artificial knee joint: Secondary | ICD-10-CM | POA: Diagnosis not present

## 2020-11-21 DIAGNOSIS — R29898 Other symptoms and signs involving the musculoskeletal system: Secondary | ICD-10-CM | POA: Diagnosis not present

## 2020-11-21 DIAGNOSIS — M25662 Stiffness of left knee, not elsewhere classified: Secondary | ICD-10-CM | POA: Diagnosis not present

## 2020-11-21 DIAGNOSIS — Z7409 Other reduced mobility: Secondary | ICD-10-CM | POA: Diagnosis not present

## 2020-11-21 DIAGNOSIS — M25462 Effusion, left knee: Secondary | ICD-10-CM | POA: Diagnosis not present

## 2020-11-21 DIAGNOSIS — M25562 Pain in left knee: Secondary | ICD-10-CM | POA: Diagnosis not present

## 2020-11-26 DIAGNOSIS — Z7409 Other reduced mobility: Secondary | ICD-10-CM | POA: Diagnosis not present

## 2020-11-26 DIAGNOSIS — M25662 Stiffness of left knee, not elsewhere classified: Secondary | ICD-10-CM | POA: Diagnosis not present

## 2020-11-26 DIAGNOSIS — M25462 Effusion, left knee: Secondary | ICD-10-CM | POA: Diagnosis not present

## 2020-11-26 DIAGNOSIS — R29898 Other symptoms and signs involving the musculoskeletal system: Secondary | ICD-10-CM | POA: Diagnosis not present

## 2020-11-26 DIAGNOSIS — M25562 Pain in left knee: Secondary | ICD-10-CM | POA: Diagnosis not present

## 2020-11-26 DIAGNOSIS — Z96652 Presence of left artificial knee joint: Secondary | ICD-10-CM | POA: Diagnosis not present

## 2020-11-28 DIAGNOSIS — R29898 Other symptoms and signs involving the musculoskeletal system: Secondary | ICD-10-CM | POA: Diagnosis not present

## 2020-11-28 DIAGNOSIS — Z96652 Presence of left artificial knee joint: Secondary | ICD-10-CM | POA: Diagnosis not present

## 2020-11-28 DIAGNOSIS — M25662 Stiffness of left knee, not elsewhere classified: Secondary | ICD-10-CM | POA: Diagnosis not present

## 2020-11-28 DIAGNOSIS — M25462 Effusion, left knee: Secondary | ICD-10-CM | POA: Diagnosis not present

## 2020-11-28 DIAGNOSIS — Z7409 Other reduced mobility: Secondary | ICD-10-CM | POA: Diagnosis not present

## 2020-11-28 DIAGNOSIS — M25562 Pain in left knee: Secondary | ICD-10-CM | POA: Diagnosis not present

## 2020-12-03 DIAGNOSIS — Z7409 Other reduced mobility: Secondary | ICD-10-CM | POA: Diagnosis not present

## 2020-12-03 DIAGNOSIS — Z96652 Presence of left artificial knee joint: Secondary | ICD-10-CM | POA: Diagnosis not present

## 2020-12-03 DIAGNOSIS — M25462 Effusion, left knee: Secondary | ICD-10-CM | POA: Diagnosis not present

## 2020-12-03 DIAGNOSIS — R29898 Other symptoms and signs involving the musculoskeletal system: Secondary | ICD-10-CM | POA: Diagnosis not present

## 2020-12-03 DIAGNOSIS — M25562 Pain in left knee: Secondary | ICD-10-CM | POA: Diagnosis not present

## 2020-12-03 DIAGNOSIS — M25662 Stiffness of left knee, not elsewhere classified: Secondary | ICD-10-CM | POA: Diagnosis not present

## 2020-12-05 DIAGNOSIS — Z96652 Presence of left artificial knee joint: Secondary | ICD-10-CM | POA: Diagnosis not present

## 2020-12-05 DIAGNOSIS — M25462 Effusion, left knee: Secondary | ICD-10-CM | POA: Diagnosis not present

## 2020-12-05 DIAGNOSIS — M25662 Stiffness of left knee, not elsewhere classified: Secondary | ICD-10-CM | POA: Diagnosis not present

## 2020-12-05 DIAGNOSIS — R29898 Other symptoms and signs involving the musculoskeletal system: Secondary | ICD-10-CM | POA: Diagnosis not present

## 2020-12-05 DIAGNOSIS — M25562 Pain in left knee: Secondary | ICD-10-CM | POA: Diagnosis not present

## 2020-12-05 DIAGNOSIS — Z7409 Other reduced mobility: Secondary | ICD-10-CM | POA: Diagnosis not present

## 2020-12-10 DIAGNOSIS — M25462 Effusion, left knee: Secondary | ICD-10-CM | POA: Diagnosis not present

## 2020-12-10 DIAGNOSIS — M25662 Stiffness of left knee, not elsewhere classified: Secondary | ICD-10-CM | POA: Diagnosis not present

## 2020-12-10 DIAGNOSIS — R29898 Other symptoms and signs involving the musculoskeletal system: Secondary | ICD-10-CM | POA: Diagnosis not present

## 2020-12-10 DIAGNOSIS — Z96652 Presence of left artificial knee joint: Secondary | ICD-10-CM | POA: Diagnosis not present

## 2020-12-10 DIAGNOSIS — M25562 Pain in left knee: Secondary | ICD-10-CM | POA: Diagnosis not present

## 2020-12-10 DIAGNOSIS — Z7409 Other reduced mobility: Secondary | ICD-10-CM | POA: Diagnosis not present

## 2020-12-12 DIAGNOSIS — R29898 Other symptoms and signs involving the musculoskeletal system: Secondary | ICD-10-CM | POA: Diagnosis not present

## 2020-12-12 DIAGNOSIS — M25462 Effusion, left knee: Secondary | ICD-10-CM | POA: Diagnosis not present

## 2020-12-12 DIAGNOSIS — M25562 Pain in left knee: Secondary | ICD-10-CM | POA: Diagnosis not present

## 2020-12-12 DIAGNOSIS — M25662 Stiffness of left knee, not elsewhere classified: Secondary | ICD-10-CM | POA: Diagnosis not present

## 2020-12-12 DIAGNOSIS — Z7409 Other reduced mobility: Secondary | ICD-10-CM | POA: Diagnosis not present

## 2020-12-12 DIAGNOSIS — Z96652 Presence of left artificial knee joint: Secondary | ICD-10-CM | POA: Diagnosis not present

## 2020-12-17 DIAGNOSIS — R29898 Other symptoms and signs involving the musculoskeletal system: Secondary | ICD-10-CM | POA: Diagnosis not present

## 2020-12-17 DIAGNOSIS — M25562 Pain in left knee: Secondary | ICD-10-CM | POA: Diagnosis not present

## 2020-12-17 DIAGNOSIS — Z7409 Other reduced mobility: Secondary | ICD-10-CM | POA: Diagnosis not present

## 2020-12-17 DIAGNOSIS — M25662 Stiffness of left knee, not elsewhere classified: Secondary | ICD-10-CM | POA: Diagnosis not present

## 2020-12-17 DIAGNOSIS — M25462 Effusion, left knee: Secondary | ICD-10-CM | POA: Diagnosis not present

## 2020-12-17 DIAGNOSIS — Z96652 Presence of left artificial knee joint: Secondary | ICD-10-CM | POA: Diagnosis not present

## 2020-12-21 ENCOUNTER — Other Ambulatory Visit: Payer: Self-pay | Admitting: Cardiovascular Disease

## 2020-12-23 ENCOUNTER — Telehealth: Payer: Self-pay | Admitting: Cardiovascular Disease

## 2020-12-23 DIAGNOSIS — M25662 Stiffness of left knee, not elsewhere classified: Secondary | ICD-10-CM | POA: Diagnosis not present

## 2020-12-23 DIAGNOSIS — Z7409 Other reduced mobility: Secondary | ICD-10-CM | POA: Diagnosis not present

## 2020-12-23 DIAGNOSIS — M25462 Effusion, left knee: Secondary | ICD-10-CM | POA: Diagnosis not present

## 2020-12-23 DIAGNOSIS — R29898 Other symptoms and signs involving the musculoskeletal system: Secondary | ICD-10-CM | POA: Diagnosis not present

## 2020-12-23 DIAGNOSIS — Z96652 Presence of left artificial knee joint: Secondary | ICD-10-CM | POA: Diagnosis not present

## 2020-12-23 DIAGNOSIS — M25562 Pain in left knee: Secondary | ICD-10-CM | POA: Diagnosis not present

## 2020-12-23 NOTE — Telephone Encounter (Signed)
Patient calling the office for samples of medication:   1.  What medication and dosage are you requesting samples for? Brilinta 60 MG  tablet 2.  Are you currently out of this medication?  No, patient states she has enough medication to get her through the next few days

## 2020-12-25 MED ORDER — TICAGRELOR 60 MG PO TABS: 60.0000 mg | ORAL_TABLET | Freq: Two times a day (BID) | ORAL | 0 refills | Status: DC

## 2020-12-25 NOTE — Telephone Encounter (Signed)
Two Boxes of Brilinta 60 mg QTY: 14 tablets each LOT#: [id number] EXP: 04/2022. Patient stated she will come by the office on Monday to pick up samples that will be left at the front desk.

## 2021-01-02 DIAGNOSIS — Z96652 Presence of left artificial knee joint: Secondary | ICD-10-CM | POA: Diagnosis not present

## 2021-01-02 DIAGNOSIS — M25562 Pain in left knee: Secondary | ICD-10-CM | POA: Diagnosis not present

## 2021-01-02 DIAGNOSIS — R29898 Other symptoms and signs involving the musculoskeletal system: Secondary | ICD-10-CM | POA: Diagnosis not present

## 2021-01-02 DIAGNOSIS — M25462 Effusion, left knee: Secondary | ICD-10-CM | POA: Diagnosis not present

## 2021-01-02 DIAGNOSIS — Z7409 Other reduced mobility: Secondary | ICD-10-CM | POA: Diagnosis not present

## 2021-01-02 DIAGNOSIS — M25662 Stiffness of left knee, not elsewhere classified: Secondary | ICD-10-CM | POA: Diagnosis not present

## 2021-01-16 ENCOUNTER — Ambulatory Visit: Payer: Medicare HMO | Admitting: Cardiovascular Disease

## 2021-01-16 ENCOUNTER — Other Ambulatory Visit: Payer: Self-pay

## 2021-01-16 ENCOUNTER — Encounter: Payer: Self-pay | Admitting: Cardiovascular Disease

## 2021-01-16 VITALS — BP 146/60 | HR 71 | Ht 61.0 in | Wt 149.0 lb

## 2021-01-16 DIAGNOSIS — E039 Hypothyroidism, unspecified: Secondary | ICD-10-CM

## 2021-01-16 DIAGNOSIS — Z9861 Coronary angioplasty status: Secondary | ICD-10-CM | POA: Diagnosis not present

## 2021-01-16 DIAGNOSIS — I10 Essential (primary) hypertension: Secondary | ICD-10-CM

## 2021-01-16 DIAGNOSIS — R011 Cardiac murmur, unspecified: Secondary | ICD-10-CM | POA: Diagnosis not present

## 2021-01-16 DIAGNOSIS — I2510 Atherosclerotic heart disease of native coronary artery without angina pectoris: Secondary | ICD-10-CM | POA: Diagnosis not present

## 2021-01-16 DIAGNOSIS — E785 Hyperlipidemia, unspecified: Secondary | ICD-10-CM

## 2021-01-16 NOTE — Patient Instructions (Signed)
Medication Instructions:  Your physician recommends that you continue on your current medications as directed. Please refer to the Current Medication list given to you today.  *If you need a refill on your cardiac medications before your next appointment, please call your pharmacy*   Testing/Procedures: Your physician has requested that you have an echocardiogram. Echocardiography is a painless test that uses sound waves to create images of your heart. It provides your doctor with information about the size and shape of your heart and how well your hearts chambers and valves are working. This procedure takes approximately one hour. There are no restrictions for this procedure. This procedure is done at [address].   Follow-Up: At [facility], you and your health needs are our priority.  As part of our continuing mission to provide you with exceptional heart care, we have created designated Provider Care Teams.  These Care Teams include your primary Cardiologist (physician) and Advanced Practice Providers (APPs -  Physician Assistants and Nurse Practitioners) who all work together to provide you with the care you need, when you need it.  We recommend signing up for the patient portal called "MyChart".  Sign up information is provided on this After Visit Summary.  MyChart is used to connect with patients for Virtual Visits (Telemedicine).  Patients are able to view lab/test results, encounter notes, upcoming appointments, etc.  Non-urgent messages can be sent to your provider as well.   To learn more about what you can do with MyChart, go to [email or web address].    Your next appointment:   12 month(s)  The format for your next appointment:   In Person  Provider:   Shelva Majestic, MD

## 2021-01-16 NOTE — Progress Notes (Signed)
Patient ID: Cathy Reilly, female   DOB: 1940/07/31, 80 y.o.   MRN: [id number]    PCP: Dr. Carol Ada  HPI: Cathy Reilly is a 80 y.o. female who presents to the office today for a 13 month follow up cardiology evaluation.   Ms. Formoso has a history of hypertension and hypothyroidism.  She was admitted in the early morning on 12/19/2014 with a symptom complex suggestive of unstable angina.  She ruled in for non-ST segment elevation MI with initial troponin of 5.  She was referred for cardiac catheterization.  In the catheterization laboratory she developed an episode of VT/VF and required defibrillation.  She underwent successful intervention to a subtotally/totally occluded LAD which had diffuse proximal to mid disease and was successfully stented with a 3033 mm Xience Alpine stent.  She was also felt to have a possible spontaneous dissection with a 90% distal intermediate stenosis ulcerated plaque.  She was maintained on anticoagulation and several days later was brought back to the catheterization laboratory.  Her LAD stent was widely patent and there was resolution of her prior intermediate stenosis.  Subsequent, she has remained stable.  She is participating in phase II cardiac rehabilitation.  She denies recurrent anginal symptoms.  She admits to being active.  She is unaware of any palpitations.  When I saw her in October 2017 she had been on aspirin 81 mg and Brilinta 90 twice a day for dual antiplatelet therapy. I discussed the Pegasus trial data and ultimately switched her to 60 mg, Brilinta twice a day, which she has been taking and tolerating.  When I saw her in November 2018 she was stable and denied any episodes of chest pain or palpitations.  She stays active taking care of her 9 grandchildren and has 5 great-grandchildren.  She has been on amlodipine 5 mg, lisinopril 20 mg, metoprolol 37.5 mg twice a day for hypertension and post MI regimen.  She was on aspirin and low-dose  Brilinta at 60 mg twice a day for dual antiplatelet therapy.  She has been on Crestor mg daily for hyperlipidemia with target LDL less than 70.  She states her blood pressure typically at home ranges in the 130 to 160 range.    When seen in May 2019  she denied any recurrent anginal symptomatology.   Her blood pressure at home was running in the 140 - 150 range.    Cholesterol was 118 LDL 59.  She was on amlodipine 7.5 mg , metoprolol 37.5 mg twice a day and lisinopril 20 mg for hypertension.  She has hypothyroidism on levothyroxine 88 mcg.  Not had any bleeding on aspirin and Brilinta.  I discussed with her new hypertensive guidelines and recommended further titration of amlodipine up to 10 mg daily.  I saw her in February 2020.  She had developed swelling on the increased amlodipine dose and this had been reduced back down to 5 mg with HCTZ 12.5 mg being added to her regimen.  Edema had resolved.  I saw her in September 2020 at which time she continued to feel well. Her blood pressure most of the time typically is over 130.  She denied chest pain or shortness of breath or palpitations.  She was taking amlodipine 5 mg, HCTZ 12.5 mg, lisinopril 30 mg and metoprolol 37.5 mg twice a day for blood pressure control.  During that evaluation, I recommended slight titration of lisinopril from 20 mg up to 30 mg daily.  I last saw her  on December 25, 2019 at which time she felt well.  Her blood pressure had improved with the increase in her lisinopril dose.   Approximately 3 months ago she did experience an episode of midepigastric discomfort which she felt most likely was due to gas.  She denies any exertional chest pain symptomatology.  She denies palpitations.  She continues to be on aspirin and low-dose Brilinta following her VT VF arrest.  She is on amlodipine 5 mg, HCTZ 12.5 mg, lisinopril 30 mg in addition to metoprolol tartrate 37.5 mg twice a day.  She continues to be on rosuvastatin 20 mg daily for  hyperlipidemia.  In February 2021 LDL cholesterol was 61.  Since I last saw her, she continues to see Dr. Carol Ada who will be checking her laboratory in January 2023.  She had undergone knee surgery with initial left torn meniscus and ultimate knee replacement surgery.  She tolerated surgery well.  She denies chest pain or shortness of breath.  She continues to be on amlodipine 5 mg, HCTZ 12.5 mg, lisinopril 30 mg daily and metoprolol tartrate 37.5 mg twice a day.  She is on levothyroxine for hypothyroidism.  She continues to be on rosuvastatin 20 mg daily.  She presents for evaluation.  Past Medical History:  Diagnosis Date   Anxiety    Bronchitis 12/2014   CAD (coronary artery disease), native coronary artery s/p DES to LAD 12/20/2014 01/03/2015   Hypertension    Hypothyroidism    NSTEMI (non-ST elevated myocardial infarction) ([place]) 12/2014   Osteopenia    hip   Prediabetes Hgb A1C 6.0 01/03/2015   Thyroid disease    VF (ventricular fibrillation) during cath on 12/20/2014 from occluded mid LAD, s/p defib     Past Surgical History:  Procedure Laterality Date   APPENDECTOMY     CARDIAC CATHETERIZATION N/A 12/20/2014   Procedure: Left Heart Cath and Coronary Angiography;  Surgeon: Troy Sine, MD;  Location: [place] [facility];  Service: Cardiovascular;  Laterality: N/A;   CARDIAC CATHETERIZATION N/A 12/20/2014   Procedure: Coronary Stent Intervention;  Surgeon: Troy Sine, MD;  Location: [place] [facility];  Service: Cardiovascular;  Laterality: N/A;   CARDIAC CATHETERIZATION N/A 12/24/2014   Procedure: Left Heart Cath and Coronary Angiography;  Surgeon: Jettie Booze, MD;  Location: [place];  Service: Cardiovascular;  Laterality: N/A;   CHOLECYSTECTOMY     CORONARY ANGIOPLASTY WITH STENT PLACEMENT     TONSILLECTOMY      Allergies  Allergen Reactions   Codeine Nausea And Vomiting   Crestor [Rosuvastatin] Other (See Comments)    myaglia   Lipitor  [Atorvastatin] Other (See Comments)    myaglia   Septra [Sulfamethoxazole-Trimethoprim] Nausea And Vomiting   Simvastatin Other (See Comments)    myaglia   Welchol [Colesevelam Hcl] Other (See Comments)    myaglia   Prednisone Palpitations    Current Outpatient Medications  Medication Sig Dispense Refill   amLODipine (NORVASC) 5 MG tablet Take 1 tablet (5 mg total) by mouth daily. 90 tablet 3   aspirin 81 MG tablet Take 81 mg by mouth daily.     ELDERBERRY PO Take 50 mg by mouth 2 (two) times daily.     hydrochlorothiazide (MICROZIDE) 12.5 MG capsule TAKE 1 CAPSULE (12.5 MG TOTAL) BY MOUTH DAILY. 90 capsule 3   levothyroxine (SYNTHROID, LEVOTHROID) 88 MCG tablet Take 88 mcg by mouth daily before breakfast.     lisinopril (ZESTRIL) 30 MG tablet Take  30 mg by mouth daily.      metoprolol tartrate (LOPRESSOR) 25 MG tablet TAKE 1 AND 1/2 TABLETS TWO TIMES DAILY 270 tablet 1   nitroGLYCERIN (NITROSTAT) 0.4 MG SL tablet Place 1 tablet (0.4 mg total) under the tongue every 5 (five) minutes x 3 doses as needed for chest pain. 25 tablet 1   rosuvastatin (CRESTOR) 20 MG tablet Take 1 tablet (20 mg total) by mouth daily. 30 tablet 5   ticagrelor (BRILINTA) 60 MG TABS tablet Take 1 tablet (60 mg total) by mouth 2 (two) times daily. KEEP UPCOMING APPOINTMENT 60 tablet 0   vitamin B-12 (CYANOCOBALAMIN) 500 MCG tablet Take 500 mcg by mouth daily.     No current facility-administered medications for this visit.    Social History   Socioeconomic History   Marital status: Married    Spouse name: Not on file   Number of children: Not on file   Years of education: Not on file   Highest education level: Not on file  Occupational History   Not on file  Tobacco Use   Smoking status: Never   Smokeless tobacco: Never  Vaping Use   Vaping Use: Never used  Substance and Sexual Activity   Alcohol use: No    Alcohol/week: 0.0 standard drinks   Drug use: No   Sexual activity: Not on file  Other  Topics Concern   Not on file  Social History Narrative   Not on file   Social Determinants of Health   Financial Resource Strain: Not on file  Food Insecurity: Not on file  Transportation Needs: Not on file  Physical Activity: Not on file  Stress: Not on file  Social Connections: Not on file  Intimate Partner Violence: Not on file    Family History  Problem Relation Age of Onset   Hypertension Mother    Hypertension Father    Hypertension Sister    Cancer Sister    Hypertension Brother    Cancer Brother    CVA Brother    Transient ischemic attack Sister     ROS General: Negative; No fevers, chills, or night sweats HEENT: Negative; No changes in vision or hearing, sinus congestion, difficulty swallowing Pulmonary: Negative; No cough, wheezing, shortness of breath, hemoptysis Cardiovascular: See HPI: No chest pain, presyncope, syncope, palpatations GI: Negative; No nausea, vomiting, diarrhea, or abdominal pain GU: Negative; No dysuria, hematuria, or difficulty voiding Musculoskeletal: Repair of left torn meniscus and then subsequent knee replacement Hematologic: Negative; no easy bruising, bleeding Endocrine: Negative; no heat/cold intolerance; no diabetes, Neuro: Negative; no changes in balance, headaches Skin: Negative; No rashes or skin lesions Psychiatric: Negative; No behavioral problems, depression Sleep: Negative; No snoring,  daytime sleepiness, hypersomnolence, bruxism, restless legs, hypnogognic hallucinations. Other comprehensive 14 point system review is negative   Physical Exam BP (!) 146/60    Pulse 71    Ht $R'5\' 1"'oM$  (1.549 m)    Wt 149 lb (67.6 kg)    SpO2 98%    BMI 28.15 kg/m    Repeat blood pressure by me 124/60  Wt Readings from Last 3 Encounters:  01/16/21 149 lb (67.6 kg)  12/25/19 158 lb 9.6 oz (71.9 kg)  10/25/18 155 lb (70.3 kg)   General: Alert, oriented, no distress.  Skin: normal turgor, no rashes, warm and dry HEENT: Normocephalic,  atraumatic. Pupils equal round and reactive to light; sclera anicteric; extraocular muscles intact;  Nose without nasal septal hypertrophy Mouth/Parynx benign; Mallinpatti scale 3 Neck: No  JVD, no carotid bruits; normal carotid upstroke Lungs: clear to ausculatation and percussion; no wheezing or rales Chest wall: without tenderness to palpitation Heart: PMI not displaced, RRR, s1 s2 normal, 7-8/4 systolic murmur, no diastolic murmur, no rubs, gallops, thrills, or heaves Abdomen: soft, nontender; no hepatosplenomehaly, BS+; abdominal aorta nontender and not dilated by palpation. Back: no CVA tenderness Pulses 2+ Musculoskeletal: full range of motion, normal strength, no joint deformities Extremities: no clubbing cyanosis or edema, Homan's sign negative  Neurologic: grossly nonfocal; Cranial nerves grossly wnl Psychologic: Normal mood and affect   January 16, 2021 ECG (independently read by me): Sinus bradycardia at 55; no ectopy, normal intervals  September 2020 ECG (independently read by me): Normal sinus rhythm at 62 bpm.  No ectopy.  QTc interval 472 ms.  February 2020 ECG (independently read by me): Normal sinus rhythm at 61 bpm.  No ectopy.  Normal intervals.  May 2019 ECG (independently read by me): Normal sinus rhythm at 65 bpm.  No ectopy.  Normal intervals.  November 2018 ECG (independently read by me): Sinus bradycardia 57 bpm.  Normal intervals.  No ectopy.  No ST segment changes.  March 2018 ECG (independently read by me): Normal sinus rhythm at 60 bpm.  Preserved R waves inferiorly with small nondiagnostic Q waves in the 3  11/26/2015 ECG (independently read by me): Sinus bradycardia 59 bpm.  No ECG evidence of her prior MI.  Preserved R waves inferiorly with small Q wave in 3.  ECG (independently read by me):  Sinus bradycardia 59 bpm.  No ectopy.  No ECG criteria for her MI.  LABS:  BMP Latest Ref Rng & Units 08/29/2019 03/28/2018 03/18/2018  Glucose 70 - 99 mg/dL  116(H) 136(H) 100(H)  BUN 8 - 23 mg/dL 28(H) 9 11  Creatinine 0.44 - 1.00 mg/dL 0.67 0.84 0.72  BUN/Creat Ratio 12 - 28 - - 15  Sodium 135 - 145 mmol/L 135 139 142  Potassium 3.5 - 5.1 mmol/L 3.2(L) 3.4(L) 4.2  Chloride 98 - 111 mmol/L 97(L) 102 100  CO2 22 - 32 mmol/L $RemoveB'29 24 25  'vZHRCLrk$ Calcium 8.9 - 10.3 mg/dL 9.2 9.3 9.9     Hepatic Function Latest Ref Rng & Units 03/28/2018 03/18/2018 04/20/2016  Total Protein 6.5 - 8.1 g/dL 7.3 7.0 6.8  Albumin 3.5 - 5.0 g/dL 3.9 4.5 4.3  AST 15 - 41 U/L 37 20 20  ALT 0 - 44 U/L $Remo'30 15 14  'hJwJs$ Alk Phosphatase 38 - 126 U/L 62 71 67  Total Bilirubin 0.3 - 1.2 mg/dL 1.0 1.6(H) 1.2    CBC Latest Ref Rng & Units 08/29/2019 03/28/2018 03/18/2018  WBC 4.0 - 10.5 K/uL 8.5 7.4 9.4  Hemoglobin 12.0 - 15.0 g/dL 12.4 12.2 12.7  Hematocrit 36.0 - 46.0 % 36.8 37.5 36.5  Platelets 150 - 400 K/uL 275 242 295   Lab Results  Component Value Date   MCV 88.5 08/29/2019   MCV 89.1 03/28/2018   MCV 86 03/18/2018    Lab Results  Component Value Date   TSH 1.490 03/18/2018    BNP    Component Value Date/Time   BNP 552.5 (H) 01/03/2015 1000    ProBNP No results found for: PROBNP   Lipid Panel     Component Value Date/Time   CHOL 111 03/18/2018 0907   TRIG 81 03/18/2018 0907   HDL 46 03/18/2018 0907   CHOLHDL 2.4 03/18/2018 0907   CHOLHDL 2.8 04/20/2016 1039   VLDL 21 04/20/2016 1039  LDLCALC 49 03/18/2018 0907     RADIOLOGY: No results found.  IMPRESSION:  No diagnosis found.   ASSESSMENT AND PLAN:  Ms. Marionette Meskill is a 80 year-old female who suffered a non-ST segment elevation MI in November 2016 and underwent successful stenting of a subtotally occluded LAD extending proximally into the mid vessel. She developed VT/VF requiring defibrillation in the laboratory.  At the time she was also felt to have a possible ulcerated plaque in the distal ramus immediate vessel, which ultimately cleared with anticoagulation therapy.  Since her initial  presentation, she has been free of recurrent anginal symptomatology.  She took aspirin and Brilinta 90 mg bid for 1 year and ultimately was switched to reduced dose Brilinta at 60 mg twice a day, which she has tolerated well.  In the past I had discussed the possibility of switching to clopidogrel but she preferred to stay on Brilinta since she has been doing exceptionally well on this therapy.  Her blood pressure today on recheck by me was excellent at 124/60 on her regimen now of amlodipine 5 mg, HCTZ 12.5 mg, lisinopril 30 mg, and metoprolol tartrate 37.5 mg twice a day.  She is not having any anginal symptomatology with reference to her CAD.  She continues to be on  DAPT with aspirin and reduced-dose Brilinta.  She will be having repeat laboratory next month with Dr. Tamala Julian.  She continues to be on rosuvastatin 20 mg for hyperlipidemia with target LDL less than 70.  On exam her systolic murmur is slightly more prominent than previously.  I have recommended she undergo a follow-up echo Doppler study for reassessment.  I will contact her regarding the results.  As long as she is stable I will see her in 1 year for reevaluation or sooner as needed  Troy Sine, MD, [facility]  01/16/2021 5:34 PM

## 2021-01-23 ENCOUNTER — Encounter: Payer: Self-pay | Admitting: Cardiovascular Disease

## 2021-01-30 ENCOUNTER — Other Ambulatory Visit: Payer: Self-pay | Admitting: Cardiovascular Disease

## 2021-01-30 ENCOUNTER — Telehealth: Payer: Self-pay | Admitting: Cardiovascular Disease

## 2021-01-30 NOTE — Telephone Encounter (Signed)
Patient calling the office for samples of medication:   1.  What medication and dosage are you requesting samples for? Brilinta  2.  Are you currently out of this medication?  Just a few

## 2021-01-30 NOTE — Telephone Encounter (Signed)
Pt notified she will come and pick up

## 2021-02-05 DIAGNOSIS — Z7409 Other reduced mobility: Secondary | ICD-10-CM | POA: Diagnosis not present

## 2021-02-05 DIAGNOSIS — M25662 Stiffness of left knee, not elsewhere classified: Secondary | ICD-10-CM | POA: Diagnosis not present

## 2021-02-05 DIAGNOSIS — R29898 Other symptoms and signs involving the musculoskeletal system: Secondary | ICD-10-CM | POA: Diagnosis not present

## 2021-02-05 DIAGNOSIS — M25462 Effusion, left knee: Secondary | ICD-10-CM | POA: Diagnosis not present

## 2021-02-05 DIAGNOSIS — M25562 Pain in left knee: Secondary | ICD-10-CM | POA: Diagnosis not present

## 2021-02-13 ENCOUNTER — Other Ambulatory Visit: Payer: Self-pay

## 2021-02-13 ENCOUNTER — Ambulatory Visit (HOSPITAL_COMMUNITY): Payer: Medicare HMO | Attending: Cardiovascular Disease

## 2021-02-13 DIAGNOSIS — E785 Hyperlipidemia, unspecified: Secondary | ICD-10-CM | POA: Insufficient documentation

## 2021-02-13 DIAGNOSIS — I2510 Atherosclerotic heart disease of native coronary artery without angina pectoris: Secondary | ICD-10-CM | POA: Diagnosis not present

## 2021-02-13 DIAGNOSIS — I10 Essential (primary) hypertension: Secondary | ICD-10-CM | POA: Diagnosis not present

## 2021-02-13 DIAGNOSIS — Z9861 Coronary angioplasty status: Secondary | ICD-10-CM | POA: Diagnosis not present

## 2021-02-13 LAB — ECHOCARDIOGRAM COMPLETE
Area-P 1/2: 3.16 cm2
S' Lateral: 1.8 cm

## 2021-02-16 DIAGNOSIS — Z20828 Contact with and (suspected) exposure to other viral communicable diseases: Secondary | ICD-10-CM | POA: Diagnosis not present

## 2021-02-16 DIAGNOSIS — J209 Acute bronchitis, unspecified: Secondary | ICD-10-CM | POA: Diagnosis not present

## 2021-02-16 DIAGNOSIS — M791 Myalgia, unspecified site: Secondary | ICD-10-CM | POA: Diagnosis not present

## 2021-02-16 DIAGNOSIS — J0100 Acute maxillary sinusitis, unspecified: Secondary | ICD-10-CM | POA: Diagnosis not present

## 2021-02-18 DIAGNOSIS — E039 Hypothyroidism, unspecified: Secondary | ICD-10-CM | POA: Diagnosis not present

## 2021-02-18 DIAGNOSIS — E782 Mixed hyperlipidemia: Secondary | ICD-10-CM | POA: Diagnosis not present

## 2021-02-18 DIAGNOSIS — M8580 Other specified disorders of bone density and structure, unspecified site: Secondary | ICD-10-CM | POA: Diagnosis not present

## 2021-02-18 DIAGNOSIS — I214 Non-ST elevation (NSTEMI) myocardial infarction: Secondary | ICD-10-CM | POA: Diagnosis not present

## 2021-02-18 DIAGNOSIS — I2510 Atherosclerotic heart disease of native coronary artery without angina pectoris: Secondary | ICD-10-CM | POA: Diagnosis not present

## 2021-02-18 DIAGNOSIS — I10 Essential (primary) hypertension: Secondary | ICD-10-CM | POA: Diagnosis not present

## 2021-03-06 DIAGNOSIS — R509 Fever, unspecified: Secondary | ICD-10-CM | POA: Diagnosis not present

## 2021-03-06 DIAGNOSIS — R059 Cough, unspecified: Secondary | ICD-10-CM | POA: Diagnosis not present

## 2021-03-06 DIAGNOSIS — J324 Chronic pansinusitis: Secondary | ICD-10-CM | POA: Diagnosis not present

## 2021-03-06 DIAGNOSIS — Z20828 Contact with and (suspected) exposure to other viral communicable diseases: Secondary | ICD-10-CM | POA: Diagnosis not present

## 2021-03-10 DIAGNOSIS — R42 Dizziness and giddiness: Secondary | ICD-10-CM | POA: Diagnosis not present

## 2021-03-10 DIAGNOSIS — R051 Acute cough: Secondary | ICD-10-CM | POA: Diagnosis not present

## 2021-03-12 ENCOUNTER — Ambulatory Visit
Admission: RE | Admit: 2021-03-12 | Discharge: 2021-03-12 | Disposition: A | Discharge status: Home / Self Care | Payer: Medicare HMO | Source: Ambulatory Visit | Attending: Family Medicine | Admitting: Family Medicine

## 2021-03-12 ENCOUNTER — Other Ambulatory Visit: Payer: Self-pay | Admitting: Family Medicine

## 2021-03-12 DIAGNOSIS — J069 Acute upper respiratory infection, unspecified: Secondary | ICD-10-CM

## 2021-03-12 DIAGNOSIS — R059 Cough, unspecified: Secondary | ICD-10-CM | POA: Diagnosis not present

## 2021-03-12 DIAGNOSIS — R051 Acute cough: Secondary | ICD-10-CM | POA: Diagnosis not present

## 2021-03-12 DIAGNOSIS — R509 Fever, unspecified: Secondary | ICD-10-CM | POA: Diagnosis not present

## 2021-03-12 DIAGNOSIS — R0602 Shortness of breath: Secondary | ICD-10-CM | POA: Diagnosis not present

## 2021-04-07 DIAGNOSIS — Z1389 Encounter for screening for other disorder: Secondary | ICD-10-CM | POA: Diagnosis not present

## 2021-04-07 DIAGNOSIS — E039 Hypothyroidism, unspecified: Secondary | ICD-10-CM | POA: Diagnosis not present

## 2021-04-07 DIAGNOSIS — I2510 Atherosclerotic heart disease of native coronary artery without angina pectoris: Secondary | ICD-10-CM | POA: Diagnosis not present

## 2021-04-07 DIAGNOSIS — Z0000 Encounter for general adult medical examination without abnormal findings: Secondary | ICD-10-CM | POA: Diagnosis not present

## 2021-04-07 DIAGNOSIS — I10 Essential (primary) hypertension: Secondary | ICD-10-CM | POA: Diagnosis not present

## 2021-04-07 DIAGNOSIS — N8110 Cystocele, unspecified: Secondary | ICD-10-CM | POA: Diagnosis not present

## 2021-04-07 DIAGNOSIS — Z13820 Encounter for screening for osteoporosis: Secondary | ICD-10-CM | POA: Diagnosis not present

## 2021-04-07 DIAGNOSIS — E782 Mixed hyperlipidemia: Secondary | ICD-10-CM | POA: Diagnosis not present

## 2021-04-07 DIAGNOSIS — Z1211 Encounter for screening for malignant neoplasm of colon: Secondary | ICD-10-CM | POA: Diagnosis not present

## 2021-04-10 DIAGNOSIS — H5213 Myopia, bilateral: Secondary | ICD-10-CM | POA: Diagnosis not present

## 2021-04-17 DIAGNOSIS — M85852 Other specified disorders of bone density and structure, left thigh: Secondary | ICD-10-CM | POA: Diagnosis not present

## 2021-04-17 DIAGNOSIS — Z780 Asymptomatic menopausal state: Secondary | ICD-10-CM | POA: Diagnosis not present

## 2021-04-17 DIAGNOSIS — M85851 Other specified disorders of bone density and structure, right thigh: Secondary | ICD-10-CM | POA: Diagnosis not present

## 2021-05-06 DIAGNOSIS — H5203 Hypermetropia, bilateral: Secondary | ICD-10-CM | POA: Diagnosis not present

## 2021-06-12 DIAGNOSIS — Z1211 Encounter for screening for malignant neoplasm of colon: Secondary | ICD-10-CM | POA: Diagnosis not present

## 2021-07-18 ENCOUNTER — Other Ambulatory Visit: Payer: Self-pay

## 2021-07-21 MED ORDER — TICAGRELOR 60 MG PO TABS: 60.0000 mg | ORAL_TABLET | Freq: Two times a day (BID) | ORAL | 3 refills | Status: DC

## 2021-07-31 DIAGNOSIS — N813 Complete uterovaginal prolapse: Secondary | ICD-10-CM | POA: Diagnosis not present

## 2021-07-31 DIAGNOSIS — N858 Other specified noninflammatory disorders of uterus: Secondary | ICD-10-CM | POA: Diagnosis not present

## 2021-07-31 DIAGNOSIS — R1900 Intra-abdominal and pelvic swelling, mass and lump, unspecified site: Secondary | ICD-10-CM | POA: Diagnosis not present

## 2021-09-15 DIAGNOSIS — J209 Acute bronchitis, unspecified: Secondary | ICD-10-CM | POA: Diagnosis not present

## 2021-09-15 DIAGNOSIS — R051 Acute cough: Secondary | ICD-10-CM | POA: Diagnosis not present

## 2021-09-15 DIAGNOSIS — B9689 Other specified bacterial agents as the cause of diseases classified elsewhere: Secondary | ICD-10-CM | POA: Diagnosis not present

## 2021-09-15 DIAGNOSIS — R062 Wheezing: Secondary | ICD-10-CM | POA: Diagnosis not present

## 2021-09-18 DIAGNOSIS — Z01818 Encounter for other preprocedural examination: Secondary | ICD-10-CM | POA: Diagnosis not present

## 2021-10-09 DIAGNOSIS — E782 Mixed hyperlipidemia: Secondary | ICD-10-CM | POA: Diagnosis not present

## 2021-10-09 DIAGNOSIS — D509 Iron deficiency anemia, unspecified: Secondary | ICD-10-CM | POA: Diagnosis not present

## 2021-10-09 DIAGNOSIS — I10 Essential (primary) hypertension: Secondary | ICD-10-CM | POA: Diagnosis not present

## 2021-10-09 DIAGNOSIS — E039 Hypothyroidism, unspecified: Secondary | ICD-10-CM | POA: Diagnosis not present

## 2021-10-10 ENCOUNTER — Telehealth: Payer: Self-pay

## 2021-10-10 ENCOUNTER — Telehealth: Payer: Self-pay | Admitting: Cardiovascular Disease

## 2021-10-10 NOTE — Telephone Encounter (Signed)
Pt calling to F/U on Medical Clearance that was sent via fax on 10/03/21 from dr. Zigmund Daniel at [facility]. Please advise

## 2021-10-10 NOTE — Telephone Encounter (Signed)
   Name: Cathy Reilly  DOB: 09/09/1940  MRN: [id number]  Primary Cardiologist: Shelva Majestic, MD  Chart reviewed as part of pre-operative protocol coverage. Because of [name]'s past medical history and time since last visit, she will require a follow-up telephone visit in order to better assess preoperative cardiovascular risk.  Pre-op covering staff: - Please schedule appointment and call patient to inform them. If patient already had an upcoming appointment within acceptable timeframe, please add "pre-op clearance" to the appointment notes so provider is aware. - Please contact requesting surgeon's office via preferred method (i.e, phone, fax) to inform them of need for appointment prior to surgery.  This message will also be routed to Dr Claiborne Billings for input on holding ASA in a patient with proximal LAD stenting as requested below so that this information is available to the clearing provider at time of patient's appointment.   Elgie Collard, PA-C  10/10/2021, 3:47 PM

## 2021-10-10 NOTE — Telephone Encounter (Signed)
Looks to be that we only received the clearance notes today and this has been sent to pre op for review and assessment.

## 2021-10-10 NOTE — Telephone Encounter (Signed)
Covering for Dr. Claiborne Billings. OK to hold ASA for 5-7 fays for the surgery

## 2021-10-10 NOTE — Telephone Encounter (Signed)
   Pre-operative Risk Assessment    Patient Name: Cathy Reilly  DOB: January 30, 1941 MRN: [id number]      Request for Surgical Clearance    Procedure:   Hysterectomy Total Abdominal with Sacrocolpopexy   Date of Surgery:  Clearance 10/24/21                                 Surgeon:  Dr.Matthews Surgeon's Group or Practice Name:  [facility] Phone number:  [phone] Fax number:  [phone]   Type of Clearance Requested:   - Medical  - Pharmacy:  Hold Aspirin     Type of Anesthesia:  Not Indicated   Additional requests/questions:   none  Signed, Ena Dawley   10/10/2021, 2:33 PM

## 2021-10-13 NOTE — Telephone Encounter (Signed)
Patient is requesting an update on her clearance.

## 2021-10-13 NOTE — Telephone Encounter (Signed)
Left message for the pt to call back for tele pre op appt.  ?

## 2021-10-14 ENCOUNTER — Telehealth: Payer: Self-pay | Admitting: *Deleted

## 2021-10-14 NOTE — Telephone Encounter (Signed)
Left message x 2 to call back for tele pre op appt. Pt will need to be added to 9/15 due to ASA hold time and date of procedure.
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
## 2021-10-14 NOTE — Telephone Encounter (Signed)
Pt is returning call. Transferred to Carol, CMA.  

## 2021-10-15 NOTE — Progress Notes (Signed)
Virtual Visit via Telephone Note   Because of Cathy Reilly's co-morbid illnesses, she is at least at moderate risk for complications without adequate follow up.  This format is felt to be most appropriate for this patient at this time.  The patient did not have access to video technology/had technical difficulties with video requiring transitioning to audio format only (telephone).  All issues noted in this document were discussed and addressed.  No physical exam could be performed with this format.  Please refer to the patient's chart for her consent to telehealth for [facility].  Evaluation Performed:  Preoperative cardiovascular risk assessment _____________   Date:  10/15/2021   Patient ID:  Cathy Reilly, DOB [birth year], MRN [id number] Patient Location:  Home Provider location:   Office  Primary Care Provider:  Carol Ada, MD Primary Cardiologist:  Shelva Majestic, MD  Chief Complaint / Patient Profile   81 y.o. 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  Past Medical History    Past Medical History:  Diagnosis Date   Anxiety    Bronchitis 12/2014   CAD (coronary artery disease), native coronary artery s/p DES to LAD 12/20/2014 01/03/2015   Hypertension    Hypothyroidism    NSTEMI (non-ST elevated myocardial infarction) ([place]) 12/2014   Osteopenia    hip   Prediabetes Hgb A1C 6.0 01/03/2015   Thyroid disease    VF  (ventricular fibrillation) during cath on 12/20/2014 from occluded mid LAD, s/p defib    Past Surgical History:  Procedure Laterality Date   APPENDECTOMY     CARDIAC CATHETERIZATION N/A 12/20/2014   Procedure: Left Heart Cath and Coronary Angiography;  Surgeon: Troy Sine, MD;  Location: [facility];  Service: Cardiovascular;  Laterality: N/A;   CARDIAC CATHETERIZATION N/A 12/20/2014   Procedure: Coronary Stent Intervention;  Surgeon: Troy Sine, MD;  Location: [facility];  Service: Cardiovascular;  Laterality: N/A;   CARDIAC CATHETERIZATION N/A 12/24/2014   Procedure: Left Heart Cath and Coronary Angiography;  Surgeon: Jettie Booze, MD;  Location: [facility];  Service: Cardiovascular;  Laterality: N/A;   CHOLECYSTECTOMY     CORONARY ANGIOPLASTY WITH STENT PLACEMENT     TONSILLECTOMY      Allergies  Allergies  Allergen Reactions   Codeine Nausea And Vomiting   Crestor [Rosuvastatin] Other (See Comments)    myaglia   Lipitor [Atorvastatin] Other (See Comments)    myaglia   Septra [Sulfamethoxazole-Trimethoprim] Nausea And Vomiting   Simvastatin Other (See Comments)    myaglia   Welchol [Colesevelam Hcl] Other (See Comments)    myaglia   Prednisone Palpitations    History of Present Illness    Cathy Reilly is a 81 y.o. female who presents via audio/video conferencing for a telehealth visit today.  Pt was last seen in cardiology clinic on 01/16/21 by Dr. Claiborne Billings.  At that time Allis Quirarte was doing well.  The patient is now pending procedure as outlined above. Since her last visit, she  denies chest pain, shortness of breath, lower extremity edema, fatigue, palpitations, melena, hematuria, hemoptysis, diaphoresis, weakness, presyncope, syncope, orthopnea, and PND.   Home Medications    Prior to Admission medications   Medication Sig Start Date End Date Taking? Authorizing Provider  amLODipine (NORVASC) 5 MG tablet Take 1 tablet (5  mg total) by mouth daily. 03/26/20   Troy Sine, MD  aspirin 81 MG tablet Take 81 mg by mouth daily.    [provider]  ELDERBERRY PO Take 50 mg by mouth 2 (two) times daily.    [provider]  ferrous sulfate 325 (65 FE) MG tablet Take 1 tablet by mouth daily with breakfast.    [provider]  hydrochlorothiazide (MICROZIDE) 12.5 MG capsule TAKE 1 CAPSULE (12.5 MG TOTAL) BY MOUTH DAILY. 04/25/19   Troy Sine, MD  levothyroxine (SYNTHROID, LEVOTHROID) 88 MCG tablet Take 88 mcg by mouth daily before breakfast.    [provider]  lisinopril (ZESTRIL) 30 MG tablet Take 30 mg by mouth daily.  09/21/19   [provider]  metoprolol tartrate (LOPRESSOR) 25 MG tablet TAKE 1 AND 1/2 TABLETS TWO TIMES DAILY 07/13/19   Troy Sine, MD  nitroGLYCERIN (NITROSTAT) 0.4 MG SL tablet Place 1 tablet (0.4 mg total) under the tongue every 5 (five) minutes x 3 doses as needed for chest pain. 08/29/19   Troy Sine, MD  rosuvastatin (CRESTOR) 20 MG tablet Take 1 tablet (20 mg total) by mouth daily. 09/30/18   Troy Sine, MD  ticagrelor (BRILINTA) 60 MG TABS tablet Take 1 tablet (60 mg total) by mouth 2 (two) times daily. 07/21/21   Troy Sine, MD  vitamin B-12 (CYANOCOBALAMIN) 500 MCG tablet Take 500 mcg by mouth daily.    [provider]    Physical Exam    Vital Signs:  Callie Facey [place] does not have vital signs available for review today.  Given telephonic nature of communication, physical exam is limited. AAOx3. NAD. Normal affect.  Speech and respirations are unlabored.  Accessory Clinical Findings    None  Assessment & Plan    1.  Preoperative Cardiovascular Risk Assessment:The patient is doing well from a cardiac perspective. Therefore, based on ACC/AHA guidelines, the patient would be at acceptable risk for the planned procedure without further cardiovascular testing. The patient was advised that if he develops new  symptoms prior to surgery to contact our office to arrange for a follow-up visit, and he verbalized understanding. According to the Revised Cardiac Risk Index (RCRI), her Perioperative Risk of Major Cardiac Event is (%): 0.9. Her Functional Capacity in METs is: 7.59 according to the [facility] Activity Status Index (DASI).  The patient was advised that if she develops new symptoms prior to surgery to contact our office to arrange for a follow-up visit, and she verbalized understanding.  Per office protocol, she may hold Brilinta for 5-days prior to surgery and should resume as soon as hemodynamically stable after surgery. Ideally aspirin should be continued without interruption, however if the bleeding risk is too great, aspirin may be held for 7 days prior to surgery. Please resume aspirin post operatively when it is felt to be safe from a bleeding standpoint.   A copy of this note will be routed to requesting surgeon.  Time:   Today, I have spent 10 minutes with the patient with telehealth technology discussing medical history, symptoms, and management plan.     Emmaline Life, NP-C  10/17/2021, 10:38 AM [address], [address] Office [phone] Fax [phone]

## 2021-10-17 ENCOUNTER — Encounter: Payer: Self-pay | Admitting: Nurse Practitioner

## 2021-10-17 ENCOUNTER — Ambulatory Visit: Payer: Medicare HMO | Attending: Cardiovascular Disease | Admitting: Nurse Practitioner

## 2021-10-17 DIAGNOSIS — Z01810 Encounter for preprocedural cardiovascular examination: Secondary | ICD-10-CM | POA: Diagnosis not present

## 2021-10-24 DIAGNOSIS — D509 Iron deficiency anemia, unspecified: Secondary | ICD-10-CM | POA: Diagnosis not present

## 2021-10-24 DIAGNOSIS — G8918 Other acute postprocedural pain: Secondary | ICD-10-CM | POA: Diagnosis not present

## 2021-10-24 DIAGNOSIS — N858 Other specified noninflammatory disorders of uterus: Secondary | ICD-10-CM | POA: Diagnosis not present

## 2021-10-24 DIAGNOSIS — E039 Hypothyroidism, unspecified: Secondary | ICD-10-CM | POA: Diagnosis not present

## 2021-10-24 DIAGNOSIS — N83292 Other ovarian cyst, left side: Secondary | ICD-10-CM | POA: Diagnosis not present

## 2021-10-24 DIAGNOSIS — Z9049 Acquired absence of other specified parts of digestive tract: Secondary | ICD-10-CM | POA: Diagnosis not present

## 2021-10-24 DIAGNOSIS — N813 Complete uterovaginal prolapse: Secondary | ICD-10-CM | POA: Diagnosis not present

## 2021-10-24 DIAGNOSIS — D215 Benign neoplasm of connective and other soft tissue of pelvis: Secondary | ICD-10-CM | POA: Diagnosis not present

## 2021-10-24 DIAGNOSIS — E785 Hyperlipidemia, unspecified: Secondary | ICD-10-CM | POA: Diagnosis not present

## 2021-10-24 DIAGNOSIS — N812 Incomplete uterovaginal prolapse: Secondary | ICD-10-CM | POA: Diagnosis not present

## 2021-10-24 DIAGNOSIS — N83291 Other ovarian cyst, right side: Secondary | ICD-10-CM | POA: Diagnosis not present

## 2021-10-24 DIAGNOSIS — D259 Leiomyoma of uterus, unspecified: Secondary | ICD-10-CM | POA: Diagnosis not present

## 2021-10-24 DIAGNOSIS — I10 Essential (primary) hypertension: Secondary | ICD-10-CM | POA: Diagnosis not present

## 2021-10-24 DIAGNOSIS — I2510 Atherosclerotic heart disease of native coronary artery without angina pectoris: Secondary | ICD-10-CM | POA: Diagnosis not present

## 2021-10-24 DIAGNOSIS — I252 Old myocardial infarction: Secondary | ICD-10-CM | POA: Diagnosis not present

## 2021-10-29 ENCOUNTER — Telehealth: Payer: Self-pay | Admitting: Physician Assistant

## 2021-10-29 NOTE — Telephone Encounter (Signed)
Scheduled appt per 9/13 referral. Pt is aware of appt date and time. Pt is aware to arrive 15 mins prior to appt time and to bring and updated insurance card. Pt is aware of appt location.   

## 2021-11-16 NOTE — Progress Notes (Unsigned)
[facility] Telephone:([phone]) [phone]   Fax:([phone]  CONSULT NOTE  REFERRING PHYSICIAN: Dr. Tamala Julian  REASON FOR CONSULTATION:  Iron Deficiency Anemia  HPI Cathy Reilly is a 81 y.o. female with past medical history significant for coronary artery disease, NSTEMI, hypothyroidism, hyperlipidemia, and hypertension is referred to the clinic for anemia.  The patient was seen by her PCP on 10/15/2021.  She had persistent anemia at that time with a hemoglobin of 8.5, low MCV at 74.4, elevated RDW at 19, and low ferritin at 7.1.  Her iron r was low at 33.  And was going to have an upcoming uro-GYN surgery.  Therefore, she was referred to the clinic for management for her anemia.  The patient ended up having her surgery on 10/24/2021 under the care of Dr. Zigmund Daniel.  The patient had a cantaloupe sized uterine fibroid.  Her preop hemoglobin was 8.6.  Her intraoperative hemoglobin was 7.3.    No Colonoscopies on file? Who is GI, when? Normal? EGD? Uterine bleeding???   He is only been on this for a  *** years? Prescription or OTC? Dose? Compliant?. Ever need IV or blood? She was referred to the clinic for further evaluation and consideration of IV iron infusions.  How long? How long on iron? Ever need blood transfusion? Iron infusion    The patient patient denies any particular dietary habits such as being a vegan or vegetarian.  She does eat a fair amount of red meat ***   Denies any chronic kidney disease.  Denies any history of any bariatric surgery. Blood thiiner. ??? Brilenta for ***stent?  The oldest records available to me are from 2016. It appears her anemia started ***. Mild Anemia November 2016 for like a month.  Then normal until August 2023, Hbg 8.6. SHe reports he needed a blood transfusion in the past when s***  He received a iron infusion ****    Regarding symptoms of her anemia, she reports fatigue.  She *** occasional lightheadedness, occasional dyspnea on  exertion, and reports occasional sensation of palpitations.  She denies gingival bleeding, hemoptysis, hematemesis, melena, epistaxis, or hematochezia.  Bowel habits.  She denies any fever or night sweats.  She reports some cold intolerance which may be secondary to *** anemia.  She denies any lymphadenopathy.  Sge denies any NSAID use.  CKD??? She denies any particular dietary habits such as being in a vegan or vegetarian. He does ***crave ice chips.  Denies any history of bariatric surgery.    HPI  Past Medical History:  Diagnosis Date   Anxiety    Bronchitis 12/2014   CAD (coronary artery disease), native coronary artery s/p DES to LAD 12/20/2014 01/03/2015   Hypertension    Hypothyroidism    NSTEMI (non-ST elevated myocardial infarction) ([place]) 12/2014   Osteopenia    hip   Prediabetes Hgb A1C 6.0 01/03/2015   Thyroid disease    VF (ventricular fibrillation) during cath on 12/20/2014 from occluded mid LAD, s/p defib     Past Surgical History:  Procedure Laterality Date   APPENDECTOMY     CARDIAC CATHETERIZATION N/A 12/20/2014   Procedure: Left Heart Cath and Coronary Angiography;  Surgeon: Troy Sine, MD;  Location: [facility];  Service: Cardiovascular;  Laterality: N/A;   CARDIAC CATHETERIZATION N/A 12/20/2014   Procedure: Coronary Stent Intervention;  Surgeon: Troy Sine, MD;  Location: [facility];  Service: Cardiovascular;  Laterality: N/A;   CARDIAC CATHETERIZATION N/A  12/24/2014   Procedure: Left Heart Cath and Coronary Angiography;  Surgeon: Jettie Booze, MD;  Location: [facility];  Service: Cardiovascular;  Laterality: N/A;   CHOLECYSTECTOMY     CORONARY ANGIOPLASTY WITH STENT PLACEMENT     TONSILLECTOMY      Family History  Problem Relation Age of Onset   Hypertension Mother    Hypertension Father    Hypertension Sister    Cancer Sister    Hypertension Brother    Cancer Brother    CVA Brother    Transient ischemic attack Sister      Social History Social History   Tobacco Use   Smoking status: Never   Smokeless tobacco: Never  Vaping Use   Vaping Use: Never used  Substance Use Topics   Alcohol use: No    Alcohol/week: 0.0 standard drinks of alcohol   Drug use: No    Allergies  Allergen Reactions   Codeine Nausea And Vomiting   Crestor [Rosuvastatin] Other (See Comments)    myaglia   Lipitor [Atorvastatin] Other (See Comments)    myaglia   Septra [Sulfamethoxazole-Trimethoprim] Nausea And Vomiting   Simvastatin Other (See Comments)    myaglia   Welchol [Colesevelam Hcl] Other (See Comments)    myaglia   Prednisone Palpitations    Current Outpatient Medications  Medication Sig Dispense Refill   amLODipine (NORVASC) 5 MG tablet Take 1 tablet (5 mg total) by mouth daily. 90 tablet 3   aspirin 81 MG tablet Take 81 mg by mouth daily.     ELDERBERRY PO Take 50 mg by mouth 2 (two) times daily.     ferrous sulfate 325 (65 FE) MG tablet Take 1 tablet by mouth daily with breakfast.     hydrochlorothiazide (MICROZIDE) 12.5 MG capsule TAKE 1 CAPSULE (12.5 MG TOTAL) BY MOUTH DAILY. 90 capsule 3   levothyroxine (SYNTHROID, LEVOTHROID) 88 MCG tablet Take 88 mcg by mouth daily before breakfast.     lisinopril (ZESTRIL) 30 MG tablet Take 30 mg by mouth daily.      metoprolol tartrate (LOPRESSOR) 25 MG tablet TAKE 1 AND 1/2 TABLETS TWO TIMES DAILY 270 tablet 1   nitroGLYCERIN (NITROSTAT) 0.4 MG SL tablet Place 1 tablet (0.4 mg total) under the tongue every 5 (five) minutes x 3 doses as needed for chest pain. 25 tablet 1   rosuvastatin (CRESTOR) 20 MG tablet Take 1 tablet (20 mg total) by mouth daily. 30 tablet 5   ticagrelor (BRILINTA) 60 MG TABS tablet Take 1 tablet (60 mg total) by mouth 2 (two) times daily. 180 tablet 3   vitamin B-12 (CYANOCOBALAMIN) 500 MCG tablet Take 500 mcg by mouth daily.     No current facility-administered medications for this visit.    REVIEW OF SYSTEMS:   Review of Systems   Constitutional: Negative for appetite change, chills, fatigue, fever and unexpected weight change.  HENT:   Negative for mouth sores, nosebleeds, sore throat and trouble swallowing.   Eyes: Negative for eye problems and icterus.  Respiratory: Negative for cough, hemoptysis, shortness of breath and wheezing.   Cardiovascular: Negative for chest pain and leg swelling.  Gastrointestinal: Negative for abdominal pain, constipation, diarrhea, nausea and vomiting.  Genitourinary: Negative for bladder incontinence, difficulty urinating, dysuria, frequency and hematuria.   Musculoskeletal: Negative for back pain, gait problem, neck pain and neck stiffness.  Skin: Negative for itching and rash.  Neurological: Negative for dizziness, extremity weakness, gait problem, headaches, light-headedness and seizures.  Hematological: Negative  for adenopathy. Does not bruise/bleed easily.  Psychiatric/Behavioral: Negative for confusion, depression and sleep disturbance. The patient is not nervous/anxious.     PHYSICAL EXAMINATION:  There were no vitals taken for this visit.  ECOG PERFORMANCE STATUS: {CHL ONC ECOG [id number]  Physical Exam  Constitutional: Oriented to person, place, and time and well-developed, well-nourished, and in no distress. No distress.  HENT:  Head: Normocephalic and atraumatic.  Mouth/Throat: Oropharynx is clear and moist. No oropharyngeal exudate.  Eyes: Conjunctivae are normal. Right eye exhibits no discharge. Left eye exhibits no discharge. No scleral icterus.  Neck: Normal range of motion. Neck supple.  Cardiovascular: Normal rate, regular rhythm, normal heart sounds and intact distal pulses.   Pulmonary/Chest: Effort normal and breath sounds normal. No respiratory distress. No wheezes. No rales.  Abdominal: Soft. Bowel sounds are normal. Exhibits no distension and no mass. There is no tenderness.  Musculoskeletal: Normal range of motion. Exhibits no edema.  Lymphadenopathy:     No cervical adenopathy.  Neurological: Alert and oriented to person, place, and time. Exhibits normal muscle tone. Gait normal. Coordination normal.  Skin: Skin is warm and dry. No rash noted. Not diaphoretic. No erythema. No pallor.  Psychiatric: Mood, memory and judgment normal.  Vitals reviewed.  LABORATORY DATA: Lab Results  Component Value Date   WBC 8.5 08/29/2019   HGB 12.4 08/29/2019   HCT 36.8 08/29/2019   MCV 88.5 08/29/2019   PLT 275 08/29/2019      Chemistry      Component Value Date/Time   NA 135 08/29/2019 1458   NA 142 03/18/2018 0907   K 3.2 (L) 08/29/2019 1458   CL 97 (L) 08/29/2019 1458   CO2 29 08/29/2019 1458   BUN 28 (H) 08/29/2019 1458   BUN 11 03/18/2018 0907   CREATININE 0.67 08/29/2019 1458   CREATININE 0.61 04/20/2016 1039      Component Value Date/Time   CALCIUM 9.2 08/29/2019 1458   ALKPHOS 62 03/28/2018 2133   AST 37 03/28/2018 2133   ALT 30 03/28/2018 2133   BILITOT 1.0 03/28/2018 2133   BILITOT 1.6 (H) 03/18/2018 0907       RADIOGRAPHIC STUDIES: No results found.  ASSESSMENT: This is a very pleasant 81 year old Caucasian female referred to the clinic for iron deficiency anemia.  The patient was seen with Dr. Julien Nordmann today.  The patient has several lab studies performed including a CBC, CMP, iron studies, ferritin, SPEP with immunofixation, F81, and folic acid.  The patient's labs from today demonstrate ***  We will arrange for the patient to receive IV iron with Venofer 300 mg weekly x3.  We will arrange for this to be given at the [facility]. infusion center.  In the meantime, the patient will continue taking her iron supplement.  She was encouraged to take this with vitamin C.  She was also given a handout of iron rich food and encouraged to increase her dietary intake of iron rich food.  We will see her back for follow-up visit in 2 months for evaluation and repeat CBC, iron studies, ferritin.  Stool cards?  She will  continue to follow with GYN.  The patient was advised to call immediately if she has any concerning symptoms in the interval. The patient voices understanding of current disease status and treatment options and is in agreement with the current care plan. All questions were answered. The patient knows to call the clinic with any problems, questions or concerns. We can certainly see the  patient much sooner if necessary   The patient voices understanding of current disease status and treatment options and is in agreement with the current care plan.  All questions were answered. The patient knows to call the clinic with any problems, questions or concerns. We can certainly see the patient much sooner if necessary.  Thank you so much for allowing me to participate in the care of [place]. I will continue to follow up the patient with you and assist in her care.  I spent {CHL ONC TIME VISIT - [id number]} counseling the patient face to face. The total time spent in the appointment was {CHL ONC TIME VISIT - [id number]}.  Disclaimer: This note was dictated with voice recognition software. Similar sounding words can inadvertently be transcribed and may not be corrected upon review.   Darlen Gledhill L Akshara Blumenthal November 16, 2021, 9:31 PM

## 2021-11-18 ENCOUNTER — Inpatient Hospital Stay: Payer: Medicare HMO

## 2021-11-18 ENCOUNTER — Inpatient Hospital Stay: Payer: Medicare HMO | Attending: Physician Assistant | Admitting: Physician Assistant

## 2021-11-18 ENCOUNTER — Telehealth: Payer: Self-pay | Admitting: *Deleted

## 2021-11-18 ENCOUNTER — Other Ambulatory Visit: Payer: Self-pay | Admitting: Physician Assistant

## 2021-11-18 ENCOUNTER — Other Ambulatory Visit: Payer: Self-pay

## 2021-11-18 VITALS — BP 145/51 | HR 66 | Temp 97.7°F | Resp 18 | Ht 60.0 in | Wt 154.8 lb

## 2021-11-18 DIAGNOSIS — Z7982 Long term (current) use of aspirin: Secondary | ICD-10-CM | POA: Insufficient documentation

## 2021-11-18 DIAGNOSIS — Z8042 Family history of malignant neoplasm of prostate: Secondary | ICD-10-CM | POA: Diagnosis not present

## 2021-11-18 DIAGNOSIS — F5089 Other specified eating disorder: Secondary | ICD-10-CM | POA: Diagnosis not present

## 2021-11-18 DIAGNOSIS — Z7902 Long term (current) use of antithrombotics/antiplatelets: Secondary | ICD-10-CM | POA: Diagnosis not present

## 2021-11-18 DIAGNOSIS — R5383 Other fatigue: Secondary | ICD-10-CM | POA: Insufficient documentation

## 2021-11-18 DIAGNOSIS — Z79890 Hormone replacement therapy (postmenopausal): Secondary | ICD-10-CM | POA: Insufficient documentation

## 2021-11-18 DIAGNOSIS — E039 Hypothyroidism, unspecified: Secondary | ICD-10-CM | POA: Diagnosis not present

## 2021-11-18 DIAGNOSIS — I10 Essential (primary) hypertension: Secondary | ICD-10-CM | POA: Insufficient documentation

## 2021-11-18 DIAGNOSIS — E876 Hypokalemia: Secondary | ICD-10-CM

## 2021-11-18 DIAGNOSIS — D509 Iron deficiency anemia, unspecified: Secondary | ICD-10-CM | POA: Diagnosis not present

## 2021-11-18 DIAGNOSIS — I2510 Atherosclerotic heart disease of native coronary artery without angina pectoris: Secondary | ICD-10-CM | POA: Diagnosis not present

## 2021-11-18 DIAGNOSIS — I252 Old myocardial infarction: Secondary | ICD-10-CM | POA: Insufficient documentation

## 2021-11-18 DIAGNOSIS — Z8249 Family history of ischemic heart disease and other diseases of the circulatory system: Secondary | ICD-10-CM | POA: Diagnosis not present

## 2021-11-18 DIAGNOSIS — Z79899 Other long term (current) drug therapy: Secondary | ICD-10-CM | POA: Diagnosis not present

## 2021-11-18 DIAGNOSIS — Z800 Family history of malignant neoplasm of digestive organs: Secondary | ICD-10-CM | POA: Insufficient documentation

## 2021-11-18 DIAGNOSIS — E785 Hyperlipidemia, unspecified: Secondary | ICD-10-CM | POA: Diagnosis not present

## 2021-11-18 LAB — CBC WITH DIFFERENTIAL (CANCER CENTER ONLY)
Abs Immature Granulocytes: 0.03 10*3/uL (ref 0.00–0.07)
Basophils Absolute: 0.1 10*3/uL (ref 0.0–0.1)
Basophils Relative: 1 %
Eosinophils Absolute: 1.1 10*3/uL — ABNORMAL HIGH (ref 0.0–0.5)
Eosinophils Relative: 12 %
HCT: 30.3 % — ABNORMAL LOW (ref 36.0–46.0)
Hemoglobin: 9.5 g/dL — ABNORMAL LOW (ref 12.0–15.0)
Immature Granulocytes: 0 %
Lymphocytes Relative: 15 %
Lymphs Abs: 1.5 10*3/uL (ref 0.7–4.0)
MCH: 25.1 pg — ABNORMAL LOW (ref 26.0–34.0)
MCHC: 31.4 g/dL (ref 30.0–36.0)
MCV: 80.2 fL (ref 80.0–100.0)
Monocytes Absolute: 0.8 10*3/uL (ref 0.1–1.0)
Monocytes Relative: 8 %
Neutro Abs: 6.4 10*3/uL (ref 1.7–7.7)
Neutrophils Relative %: 64 %
Platelet Count: 477 10*3/uL — ABNORMAL HIGH (ref 150–400)
RBC: 3.78 MIL/uL — ABNORMAL LOW (ref 3.87–5.11)
RDW: 17.8 % — ABNORMAL HIGH (ref 11.5–15.5)
WBC Count: 9.9 10*3/uL (ref 4.0–10.5)
nRBC: 0 % (ref 0.0–0.2)

## 2021-11-18 LAB — CMP (CANCER CENTER ONLY)
ALT: 11 U/L (ref 0–44)
AST: 18 U/L (ref 15–41)
Albumin: 4.2 g/dL (ref 3.5–5.0)
Alkaline Phosphatase: 74 U/L (ref 38–126)
Anion gap: 7 (ref 5–15)
BUN: 14 mg/dL (ref 8–23)
CO2: 32 mmol/L (ref 22–32)
Calcium: 9.5 mg/dL (ref 8.9–10.3)
Chloride: 97 mmol/L — ABNORMAL LOW (ref 98–111)
Creatinine: 0.85 mg/dL (ref 0.44–1.00)
GFR, Estimated: >60 mL/min (ref >60)
Glucose, Bld: 174 mg/dL — ABNORMAL HIGH (ref 70–99)
Potassium: 2.7 mmol/L — CL (ref 3.5–5.1)
Sodium: 136 mmol/L (ref 135–145)
Total Bilirubin: 1 mg/dL (ref 0.3–1.2)
Total Protein: 7.3 g/dL (ref 6.5–8.1)

## 2021-11-18 LAB — IRON AND IRON BINDING CAPACITY (CC-WL,HP ONLY)
Iron: 39 ug/dL (ref 28–170)
Saturation Ratios: 9 % — ABNORMAL LOW (ref 10.4–31.8)
TIBC: 416 ug/dL (ref 250–450)
UIBC: 377 ug/dL (ref 148–442)

## 2021-11-18 LAB — FERRITIN: Ferritin: 13 ng/mL (ref 11–307)

## 2021-11-18 LAB — FOLATE: Folate: 21.5 ng/mL (ref >5.9)

## 2021-11-18 LAB — VITAMIN B12: Vitamin B-12: 1195 pg/mL — ABNORMAL HIGH (ref 180–914)

## 2021-11-18 MED ORDER — POTASSIUM CHLORIDE CRYS ER 20 MEQ PO TBCR: 20.0000 meq | EXTENDED_RELEASE_TABLET | Freq: Two times a day (BID) | ORAL | 0 refills | Status: DC

## 2021-11-18 MED ORDER — SODIUM CHLORIDE 0.9 % IV SOLN: INTRAVENOUS | Status: DC

## 2021-11-18 MED ORDER — POTASSIUM CHLORIDE 10 MEQ/100ML IV SOLN
10.0000 meq | INTRAVENOUS | Status: AC
  Administered 2021-11-18 (×2): 10 meq via INTRAVENOUS
  Filled 2021-11-18 (×2): qty 100

## 2021-11-18 NOTE — Telephone Encounter (Signed)
CRITICAL VALUE STICKER  CRITICAL VALUE: K 2.7  RECEIVER (on-site recipient of call): Kim RN  [place] NOTIFIED: 11/18/2021 @ 2:10 pm  MESSENGER (representative from lab): Rosann Auerbach  MD NOTIFIED: Rubin Payor Heilingoetter, PA  TIME OF NOTIFICATION: 2:13pm  RESPONSE: she will handle as needed

## 2021-11-18 NOTE — Patient Instructions (Addendum)
Potassium Chloride Injection What is this medication? POTASSIUM CHLORIDE (poe TASS i um KLOOR ide) prevents and treats low levels of potassium in your body. Potassium plays an important role in maintaining the health of your kidneys, heart, muscles, and nervous system. This medicine may be used for other purposes; ask your health care provider or pharmacist if you have questions. COMMON BRAND NAME(S): PROAMP What should I tell my care team before I take this medication? They need to know if you have any of these conditions: Addison disease Dehydration Diabetes (high blood sugar) Heart disease High levels of potassium in the blood Irregular heartbeat or rhythm Kidney disease Large areas of burned skin An unusual or allergic reaction to potassium, other medications, foods, dyes, or preservatives Pregnant or trying to get pregnant Breast-feeding How should I use this medication? This medication is injected into a vein. It is given in a hospital or clinic setting. Talk to your care team about the use of this medication in children. Special care may be needed. Overdosage: If you think you have taken too much of this medicine contact a poison control center or emergency room at once. NOTE: This medicine is only for you. Do not share this medicine with others. What if I miss a dose? This does not apply. This medication is not for regular use. What may interact with this medication? Do not take this medication with any of the following: Certain diuretics such as spironolactone, triamterene Eplerenone Sodium polystyrene sulfonate This medication may also interact with the following: Certain medications for blood pressure or heart disease like lisinopril, losartan, quinapril, valsartan Medications that lower your chance of fighting infection such as cyclosporine, tacrolimus NSAIDs, medications for pain and inflammation, like ibuprofen or naproxen Other potassium supplements Salt  substitutes This list may not describe all possible interactions. Give your health care provider a list of all the medicines, herbs, non-prescription drugs, or dietary supplements you use. Also tell them if you smoke, drink alcohol, or use illegal drugs. Some items may interact with your medicine. What should I watch for while using this medication? Visit your care team for regular checks on your progress. Tell your care team if your symptoms do not start to get better or if they get worse. You may need blood work while you are taking this medication. Avoid salt substitutes unless you are told otherwise by your care team. What side effects may I notice from receiving this medication? Side effects that you should report to your care team as soon as possible: Allergic reactions--skin rash, itching, hives, swelling of the face, lips, tongue, or throat High potassium level--muscle weakness, fast or irregular heartbeat Side effects that usually do not require medical attention (report to your care team if they continue or are bothersome): Diarrhea Nausea Stomach pain Vomiting This list may not describe all possible side effects. Call your doctor for medical advice about side effects. You may report side effects to FDA at 1-800-FDA-1088. Where should I keep my medication? This medication is given in a hospital or clinic. It will not be stored at home. NOTE: This sheet is a summary. It may not cover all possible information. If you have questions about this medicine, talk to your doctor, pharmacist, or health care provider.  2023 Elsevier/Gold Standard (2020-05-02 00:00:00)  Hypokalemia Hypokalemia means that the amount of potassium in the blood is lower than normal. Potassium is a mineral (electrolyte) that helps regulate the amount of fluid in the body. It also stimulates muscle tightening (contraction)  and helps nerves work properly. Normally, most of the body's potassium is inside cells, and  only a very small amount is in the blood. Because the amount in the blood is so small, minor changes to potassium levels in the blood can be life-threatening. What are the causes? This condition may be caused by: Antibiotic medicine. Diarrhea or vomiting. Taking too much of a medicine that helps you have a bowel movement (laxative) can cause diarrhea and lead to hypokalemia. Chronic kidney disease (CKD). Medicines that help the body get rid of excess fluid (diuretics). Eating disorders, such as anorexia or bulimia. Low magnesium levels in the body. Sweating a lot. What are the signs or symptoms? Symptoms of this condition include: Weakness. Constipation. Fatigue. Muscle cramps. Mental confusion. Skipped heartbeats or irregular heartbeat (palpitations). Tingling or numbness. How is this diagnosed? This condition is diagnosed with a blood test. How is this treated? This condition may be treated by: Taking potassium supplements. Adjusting the medicines that you take. Eating more foods that contain a lot of potassium. If your potassium level is very low, you may need to get potassium through an IV and be monitored in the hospital. Follow these instructions at home: Eating and drinking  Eat a healthy diet. A healthy diet includes fresh fruits and vegetables, whole grains, healthy fats, and lean proteins. If told, eat more foods that contain a lot of potassium. These include: Nuts, such as peanuts and pistachios. Seeds, such as sunflower seeds and pumpkin seeds. Peas, lentils, and lima beans. Whole grain and bran cereals and breads. Fresh fruits and vegetables, such as apricots, avocado, bananas, cantaloupe, kiwi, oranges, tomatoes, asparagus, and potatoes. Juices, such as orange, tomato, and prune. Lean meats, including fish. Milk and milk products, such as yogurt. General instructions Take over-the-counter and prescription medicines only as told by your health care provider. This  includes vitamins, natural food products, and supplements. Keep all follow-up visits. This is important. Contact a health care provider if: You have weakness that gets worse. You feel your heart pounding or racing. You vomit. You have diarrhea. You have diabetes and you have trouble keeping your blood sugar in your target range. Get help right away if: You have chest pain. You have shortness of breath. You have vomiting or diarrhea that lasts for more than 2 days. You faint. These symptoms may be an emergency. Get help right away. Call 911. Do not wait to see if the symptoms will go away. Do not drive yourself to the hospital. Summary Hypokalemia means that the amount of potassium in the blood is lower than normal. This condition is diagnosed with a blood test. Hypokalemia may be treated by taking potassium supplements, adjusting the medicines that you take, or eating more foods that are high in potassium. If your potassium level is very low, you may need to get potassium through an IV and be monitored in the hospital. This information is not intended to replace advice given to you by your health care provider. Make sure you discuss any questions you have with your health care provider. Document Revised: 10/03/2020 Document Reviewed: 10/03/2020 Elsevier Patient Education  [place].  Electrolytes Test Why am I having this test? Electrolytes are salts and minerals in your body that help keep the amount of water in your body in balance. They also help move nutrients into the body and waste out of the body. Electrolytes also help muscles and nerves function properly. You may have an electrolytes test, also called an  electrolytes panel, as part of a routine health screening. You may also have this test if your health care provider thinks that you may have an electrolyte imbalance. What is being tested? An electrolytes test usually checks levels of: Potassium (K+). Sodium  (Na+). Chloride (Cl-). Bicarbonate (HCO3-). What kind of sample is taken?  A blood sample is required for this test. It is usually collected by inserting a needle into a blood vessel. In some cases, a urine sample may also be required. Tell a health care provider about: All medicines you are taking, including vitamins, herbs, eye drops, creams, and over-the-counter medicines. Some medicines can affect electrolyte levels. How are the results reported? Your test results will be reported as values that indicate your electrolyte levels. Electrolytes are measured in milliequivalents per liter (mEq/L) or millimoles per liter (mmol/L). Your health care provider will compare your results to normal ranges that were established after testing a large group of people (reference ranges). Reference ranges vary among different electrolytes, labs, and hospitals. For this test, common reference ranges for an adult are: Potassium (K+): 3.5-5 mEq/L or 3.5-5 mmol/L. Sodium (Na+): 136-145 mEq/L or 136-145 mmol/L. Chloride (Cl-): 98-106 mEq/L or 98-106 mmol/L. Bicarbonate (HCO3-): 23-30 mEq/L or 23-30 mmol/L. What do the results mean? Results that are within the reference range are considered normal. Electrolyte levels that are above or below the normal range mean that you have an electrolyte imbalance. Your health care provider may do more tests to determine how to correct your imbalance. Talk with your health care provider about what your results mean. Questions to ask your health care provider Ask your health care provider, or the department that is doing the test: When will my results be ready? How will I get my results? What are my treatment options? What other tests do I need? What are my next steps? Summary Electrolytes are minerals in your body that help keep the amount of water in your body in balance. They also help move nutrients into the body and waste out of the body, and help muscles and nerves  function properly. Tell your health care provider about all medicines you are taking, including vitamins, herbs, eye drops, creams, and over-the-counter medicines. Some medicines can affect electrolyte levels. Electrolyte levels that are above or below the normal range mean that you have an electrolyte imbalance. Your health care provider may do more tests to determine how to correct your imbalance. This information is not intended to replace advice given to you by your health care provider. Make sure you discuss any questions you have with your health care provider. Document Revised: 02/11/2021 Document Reviewed: 02/11/2021 Elsevier Patient Education  [place].

## 2021-11-19 ENCOUNTER — Encounter (HOSPITAL_BASED_OUTPATIENT_CLINIC_OR_DEPARTMENT_OTHER): Payer: Self-pay | Admitting: Pediatrics

## 2021-11-19 ENCOUNTER — Ambulatory Visit
Admission: EM | Admit: 2021-11-19 | Discharge: 2021-11-19 | Disposition: A | Discharge status: Home / Self Care | Payer: Medicare HMO

## 2021-11-19 ENCOUNTER — Emergency Department (HOSPITAL_BASED_OUTPATIENT_CLINIC_OR_DEPARTMENT_OTHER)
Admission: EM | Admit: 2021-11-19 | Discharge: 2021-11-19 | Disposition: A | Discharge status: Home / Self Care | Payer: Medicare HMO | Attending: Emergency Medicine | Admitting: Emergency Medicine

## 2021-11-19 ENCOUNTER — Other Ambulatory Visit: Payer: Self-pay

## 2021-11-19 ENCOUNTER — Telehealth: Payer: Self-pay | Admitting: Pharmacy Technician

## 2021-11-19 ENCOUNTER — Emergency Department (HOSPITAL_BASED_OUTPATIENT_CLINIC_OR_DEPARTMENT_OTHER): Payer: Medicare HMO

## 2021-11-19 DIAGNOSIS — J181 Lobar pneumonia, unspecified organism: Secondary | ICD-10-CM | POA: Insufficient documentation

## 2021-11-19 DIAGNOSIS — I2510 Atherosclerotic heart disease of native coronary artery without angina pectoris: Secondary | ICD-10-CM | POA: Diagnosis not present

## 2021-11-19 DIAGNOSIS — E039 Hypothyroidism, unspecified: Secondary | ICD-10-CM | POA: Insufficient documentation

## 2021-11-19 DIAGNOSIS — R918 Other nonspecific abnormal finding of lung field: Secondary | ICD-10-CM | POA: Diagnosis not present

## 2021-11-19 DIAGNOSIS — I10 Essential (primary) hypertension: Secondary | ICD-10-CM | POA: Insufficient documentation

## 2021-11-19 DIAGNOSIS — E876 Hypokalemia: Secondary | ICD-10-CM | POA: Diagnosis not present

## 2021-11-19 DIAGNOSIS — J189 Pneumonia, unspecified organism: Secondary | ICD-10-CM

## 2021-11-19 DIAGNOSIS — R051 Acute cough: Secondary | ICD-10-CM

## 2021-11-19 DIAGNOSIS — R062 Wheezing: Secondary | ICD-10-CM | POA: Diagnosis not present

## 2021-11-19 DIAGNOSIS — I700 Atherosclerosis of aorta: Secondary | ICD-10-CM | POA: Diagnosis not present

## 2021-11-19 DIAGNOSIS — Z9889 Other specified postprocedural states: Secondary | ICD-10-CM

## 2021-11-19 DIAGNOSIS — R059 Cough, unspecified: Secondary | ICD-10-CM | POA: Diagnosis not present

## 2021-11-19 LAB — BASIC METABOLIC PANEL
Anion gap: 7 (ref 5–15)
BUN: 12 mg/dL (ref 8–23)
CO2: 29 mmol/L (ref 22–32)
Calcium: 9.2 mg/dL (ref 8.9–10.3)
Chloride: 100 mmol/L (ref 98–111)
Creatinine, Ser: 0.63 mg/dL (ref 0.44–1.00)
GFR, Estimated: >60 mL/min (ref >60)
Glucose, Bld: 103 mg/dL — ABNORMAL HIGH (ref 70–99)
Potassium: 3.1 mmol/L — ABNORMAL LOW (ref 3.5–5.1)
Sodium: 136 mmol/L (ref 135–145)

## 2021-11-19 MED ORDER — DOXYCYCLINE HYCLATE 100 MG PO CAPS: 100.0000 mg | ORAL_CAPSULE | Freq: Two times a day (BID) | ORAL | 0 refills | Status: AC

## 2021-11-19 NOTE — ED Triage Notes (Signed)
C/O cough and re-check of potassium level, stated she had an infusion yesterday.

## 2021-11-19 NOTE — Discharge Instructions (Addendum)
  Please report to ED for further evaluation.   [facility]   [address]  [place],  [address]

## 2021-11-19 NOTE — Discharge Instructions (Signed)

## 2021-11-19 NOTE — Telephone Encounter (Signed)
Dr. Toya Smothers, Juluis Rainier note:  Auth Submission: NO AUTH NEEDED Payer: [employer] medicare Medication & CPT/J Code(s) submitted: Venofer (Iron Sucrose) [id number] Route of submission (phone, fax, portal):  Phone # Fax # Auth type: Buy/Bill Units/visits requested: x3 doses Reference number:  Approval from: 11/19/21 to 02/01/22   Patient will be scheduled as soon as possible

## 2021-11-19 NOTE — ED Provider Notes (Signed)
Emergency Department Provider Note   I have reviewed the triage vital signs and the nursing notes.   HISTORY  Chief Complaint Cough   HPI Katelinn Justice is a 81 y.o. female with past history reviewed below presents emergency department with cough and congestion symptoms sent from urgent care.  Patient is also inquiring about their potassium level.  They had an appointment yesterday for infusion and was found to be hypokalemic at that time.  She received IV potassium and was discharged home with a prescription for potassium supplements.  She is not having also cramping or fatigue.  She had slightly productive cough without fever.  No significant shortness of breath or chest pain.   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 Respiratory: Denies shortness of breath. Positive cough.  Gastrointestinal: No abdominal pain. Genitourinary: Negative for dysuria. Musculoskeletal: Negative for back pain. Skin: Negative for rash. Neurological: Negative for headaches.   ____________________________________________   PHYSICAL EXAM:  VITAL SIGNS: ED Triage Vitals  Enc Vitals Group     BP 11/19/21 1306 (!) 163/65     Pulse Rate 11/19/21 1306 66     Resp 11/19/21 1306 20     Temp 11/19/21 1306 98.4 F (36.9 C)     Temp Source 11/19/21 1306 Oral     SpO2 11/19/21 1306 98 %     Weight 11/19/21 1307 152 lb 12.8 oz (69.3 kg)     Height 11/19/21 1307 5' (1.524 m)   Constitutional: Alert and oriented. Well  appearing and in no acute distress. Eyes: Conjunctivae are normal. Head: Atraumatic. Nose: No congestion/rhinnorhea. Mouth/Throat: Mucous membranes are moist.  Neck: No stridor.  Cardiovascular: Normal rate, regular rhythm. Good peripheral circulation. Grossly normal heart sounds.   Respiratory: Normal respiratory effort.  No retractions. Lungs mild ronchorous sounds on the right.  Gastrointestinal: Soft and nontender. No distention.  Musculoskeletal: No gross deformities of extremities. Neurologic:  Normal speech and language. Skin:  Skin is warm, dry and intact. No rash noted.  ____________________________________________   LABS (all labs ordered are listed, but only abnormal results are displayed)  Labs Reviewed  BASIC METABOLIC PANEL - Abnormal; Notable for the following components:      Result Value   Potassium 3.1 (*)    Glucose, Bld 103 (*)    All other components within normal limits   ____________________________________________  RADIOLOGY  DG Chest 2 View  Result Date: 11/19/2021 CLINICAL DATA:  One-week history of cough with wheezing EXAM: CHEST - 2 VIEW COMPARISON:  Chest radiograph dated 03/12/2021 FINDINGS: Normal lung volumes. Hazy right middle lobe opacity silhouetting the right heart border. No pleural effusion or pneumothorax. The heart size and mediastinal contours are within normal limits. Aortic atherosclerosis. The visualized skeletal structures are unremarkable. Right upper quadrant cholecystectomy clips. IMPRESSION: 1. Hazy right middle lobe opacity, suspicious for pneumonia. 2.  Aortic Atherosclerosis (ICD10-I70.0). Electronically Signed   By: Darrin Nipper M.D.   On: 11/19/2021 14:18    ____________________________________________  PROCEDURES  Procedure(s) performed:   Procedures  None  ____________________________________________   INITIAL IMPRESSION / ASSESSMENT AND PLAN / ED COURSE  Pertinent labs & imaging results that were available during  my care of the patient were reviewed by me and considered in my medical decision making (see chart for details).   This patient is Presenting for Evaluation of cough, which does require a range of treatment options, and is a complaint that involves a high risk of morbidity and mortality.  The Differential Diagnoses include CAP, COVID, Flu, sinusitis, hypokalemia.  I decided to review pertinent External Data, and in summary patient with K of 2.7 yesterday.    Clinical Laboratory Tests Ordered, included potassium of 3.1.   Radiologic Tests Ordered, included CXR. I independently interpreted the images and agree with radiology interpretation.   Cardiac Monitor Tracing which shows NSR.   Social Determinants of Health Risk no smoking history.   Medical Decision Making: Summary:  Patient presents emergency department with cough and congestion.  Found to have pneumonia on chest x-ray.  She was in no acute distress.  No hypoxemia.  Plan to start antibiotics.  Repeat potassium today improved from yesterday and now at 3.1.  She has potassium supplements called to her pharmacy and is picking them up after leaving the ED.  Considered admission but no hypoxemia or increased work of breathing.  Potassium improving.   Disposition: discharge  ____________________________________________  FINAL CLINICAL IMPRESSION(S) / ED DIAGNOSES  Final diagnoses:  Community acquired pneumonia of right middle lobe of lung  Hypokalemia     NEW OUTPATIENT MEDICATIONS STARTED DURING THIS VISIT:  Discharge Medication List as of 11/19/2021  2:58 PM     START taking these medications   Details  doxycycline (VIBRAMYCIN) 100 MG capsule Take 1 capsule (100 mg total) by mouth 2 (two) times daily for 7 days., Starting Wed 11/19/2021, Until Wed 11/26/2021, Normal        Note:  This document was prepared using Dragon voice recognition software and may include unintentional dictation errors.  Nanda Quinton, MD,  [facility] Emergency Medicine    Yvonne Petite, [facility], MD 11/20/21 [phone]

## 2021-11-19 NOTE — ED Triage Notes (Signed)
Pt c/o cough for over a week. States she has noticed that now she is wheezing. Initially was unproductive but no is coughing mucous. Denies pain in triage.

## 2021-11-19 NOTE — ED Provider Notes (Signed)
Patient here today for evaluation of cough that has seemingly progressed to more productive cough over the last week. Given recent potassium infusion, surgical history, cardiac history, recommended further evaluation at [place] for stat labs, imaging. Patient is agreeable with same. She does appear stable in office, normal vitals, overall well. She will transport herself to ED via [place].    Francene Finders, PA-C 11/19/21 1158

## 2021-11-24 DIAGNOSIS — I10 Essential (primary) hypertension: Secondary | ICD-10-CM | POA: Diagnosis not present

## 2021-11-24 DIAGNOSIS — Z96652 Presence of left artificial knee joint: Secondary | ICD-10-CM | POA: Diagnosis not present

## 2021-11-24 DIAGNOSIS — E876 Hypokalemia: Secondary | ICD-10-CM | POA: Diagnosis not present

## 2021-11-24 DIAGNOSIS — D509 Iron deficiency anemia, unspecified: Secondary | ICD-10-CM | POA: Diagnosis not present

## 2021-11-24 DIAGNOSIS — I2510 Atherosclerotic heart disease of native coronary artery without angina pectoris: Secondary | ICD-10-CM | POA: Diagnosis not present

## 2021-11-25 DIAGNOSIS — R059 Cough, unspecified: Secondary | ICD-10-CM | POA: Diagnosis not present

## 2021-11-25 DIAGNOSIS — Z20822 Contact with and (suspected) exposure to covid-19: Secondary | ICD-10-CM | POA: Diagnosis not present

## 2021-11-26 DIAGNOSIS — Z20822 Contact with and (suspected) exposure to covid-19: Secondary | ICD-10-CM | POA: Diagnosis not present

## 2021-11-26 DIAGNOSIS — R059 Cough, unspecified: Secondary | ICD-10-CM | POA: Diagnosis not present

## 2021-11-26 LAB — PROTEIN ELECTROPHORESIS, SERUM, WITH REFLEX
A/G Ratio: 1 (ref 0.7–1.7)
Albumin ELP: 3.4 g/dL (ref 2.9–4.4)
Alpha-1-Globulin: 0.3 g/dL (ref 0.0–0.4)
Alpha-2-Globulin: 0.8 g/dL (ref 0.4–1.0)
Beta Globulin: 1.1 g/dL (ref 0.7–1.3)
Gamma Globulin: 1.1 g/dL (ref 0.4–1.8)
Globulin, Total: 3.3 g/dL (ref 2.2–3.9)
Total Protein ELP: 6.7 g/dL (ref 6.0–8.5)

## 2021-11-27 ENCOUNTER — Ambulatory Visit (INDEPENDENT_AMBULATORY_CARE_PROVIDER_SITE_OTHER): Payer: Medicare HMO

## 2021-11-27 VITALS — BP 130/67 | HR 63 | Temp 97.8°F | Resp 18 | Ht 60.0 in | Wt 155.0 lb

## 2021-11-27 DIAGNOSIS — D509 Iron deficiency anemia, unspecified: Secondary | ICD-10-CM | POA: Diagnosis not present

## 2021-11-27 MED ORDER — SODIUM CHLORIDE 0.9 % IV SOLN
300.0000 mg | Freq: Once | INTRAVENOUS | Status: AC
  Administered 2021-11-27: 300 mg via INTRAVENOUS
  Filled 2021-11-27: qty 15

## 2021-11-27 MED ORDER — DIPHENHYDRAMINE HCL 25 MG PO CAPS
25.0000 mg | ORAL_CAPSULE | Freq: Once | ORAL | Status: AC
  Administered 2021-11-27: 25 mg via ORAL
  Filled 2021-11-27: qty 1

## 2021-11-27 MED ORDER — ACETAMINOPHEN 325 MG PO TABS
650.0000 mg | ORAL_TABLET | Freq: Once | ORAL | Status: AC
  Administered 2021-11-27: 650 mg via ORAL
  Filled 2021-11-27: qty 2

## 2021-11-27 NOTE — Progress Notes (Signed)
Diagnosis: Iron Deficiency Anemia  Provider:  Marshell Garfinkel MD  Procedure: Infusion  IV Type: Peripheral, IV Location: L Antecubital  Venofer (Iron Sucrose), Dose: 300 mg  Infusion Start Time: 5170  Infusion Stop Time: 1240  Post Infusion IV Care: Observation period completed and Peripheral IV Discontinued  Discharge: Condition: Good, Destination: Home . AVS provided to patient.   Performed by:  Arnoldo Morale, RN

## 2021-12-03 ENCOUNTER — Other Ambulatory Visit: Payer: Self-pay

## 2021-12-03 ENCOUNTER — Telehealth: Payer: Self-pay | Admitting: Cardiovascular Disease

## 2021-12-03 DIAGNOSIS — D509 Iron deficiency anemia, unspecified: Secondary | ICD-10-CM

## 2021-12-03 LAB — OCCULT BLOOD X 1 CARD TO LAB, STOOL
Fecal Occult Bld: NEGATIVE
Fecal Occult Bld: NEGATIVE
Fecal Occult Bld: NEGATIVE

## 2021-12-03 MED ORDER — TICAGRELOR 60 MG PO TABS: 60.0000 mg | ORAL_TABLET | Freq: Two times a day (BID) | ORAL | 11 refills | Status: DC

## 2021-12-03 NOTE — Telephone Encounter (Signed)
Patient calling the office for samples of medication:   1.  What medication and dosage are you requesting samples for? ticagrelor (BRILINTA) 60 MG TABS tablet  2.  Are you currently out of this medication?  No

## 2021-12-03 NOTE — Telephone Encounter (Signed)
Patient stated she wanted samples of brilinta '60mg'$ . Clinic is out of samples. Informed patient that a coupon card and application for assistance will be set aside for her to pick up. Refill sent to her preferred pharmacy.

## 2021-12-04 ENCOUNTER — Ambulatory Visit (INDEPENDENT_AMBULATORY_CARE_PROVIDER_SITE_OTHER): Payer: Medicare HMO | Admitting: *Deleted

## 2021-12-04 ENCOUNTER — Telehealth: Payer: Self-pay

## 2021-12-04 VITALS — BP 159/64 | HR 60 | Temp 97.4°F | Resp 18 | Ht 60.0 in | Wt 155.8 lb

## 2021-12-04 DIAGNOSIS — D509 Iron deficiency anemia, unspecified: Secondary | ICD-10-CM | POA: Diagnosis not present

## 2021-12-04 MED ORDER — SODIUM CHLORIDE 0.9 % IV SOLN
300.0000 mg | Freq: Once | INTRAVENOUS | Status: AC
  Administered 2021-12-04: 300 mg via INTRAVENOUS
  Filled 2021-12-04: qty 15

## 2021-12-04 MED ORDER — ACETAMINOPHEN 325 MG PO TABS
650.0000 mg | ORAL_TABLET | Freq: Once | ORAL | Status: AC
  Administered 2021-12-04: 650 mg via ORAL
  Filled 2021-12-04: qty 2

## 2021-12-04 MED ORDER — DIPHENHYDRAMINE HCL 25 MG PO CAPS
25.0000 mg | ORAL_CAPSULE | Freq: Once | ORAL | Status: AC
  Administered 2021-12-04: 25 mg via ORAL
  Filled 2021-12-04: qty 1

## 2021-12-04 NOTE — Telephone Encounter (Signed)
-----   Message from [employer], PA-C sent at 12/04/2021 10:13 AM EDT ----- Regarding: FW: Can you let her know stool cards negative ----- Message ----- From: Interface, Lab In [facility] Sent: 12/03/2021   2:50 PM EDT To: Tobe Sos Heilingoetter, PA-C

## 2021-12-04 NOTE — Progress Notes (Signed)
Diagnosis: Iron Deficiency Anemia  Provider:  Marshell Garfinkel MD  Procedure: Infusion  IV Type: Peripheral, IV Location: L Antecubital  Venofer (Iron Sucrose), Dose: 300 mg  Infusion Start Time: 1104 am  Infusion Stop Time: 1240 pm  Post Infusion IV Care: Observation period completed and Peripheral IV Discontinued  Discharge: Condition: Good, Destination: Home . AVS provided to patient.   Performed by:  Oren Beckmann, RN

## 2021-12-04 NOTE — Telephone Encounter (Signed)
This nurse spoke with patient and made aware of negative stool cards.  Advised that provider has no other recommendations at this time. She acknowledged understanding and knows to give [place] a call if the need should arise.

## 2021-12-11 ENCOUNTER — Ambulatory Visit (INDEPENDENT_AMBULATORY_CARE_PROVIDER_SITE_OTHER): Payer: Medicare HMO | Admitting: *Deleted

## 2021-12-11 VITALS — BP 145/50 | HR 54 | Temp 97.8°F | Resp 20 | Ht 60.0 in | Wt 152.8 lb

## 2021-12-11 DIAGNOSIS — D509 Iron deficiency anemia, unspecified: Secondary | ICD-10-CM | POA: Diagnosis not present

## 2021-12-11 MED ORDER — SODIUM CHLORIDE 0.9 % IV SOLN
300.0000 mg | Freq: Once | INTRAVENOUS | Status: AC
  Administered 2021-12-11: 300 mg via INTRAVENOUS
  Filled 2021-12-11: qty 15

## 2021-12-11 MED ORDER — ACETAMINOPHEN 325 MG PO TABS
650.0000 mg | ORAL_TABLET | Freq: Once | ORAL | Status: AC
  Administered 2021-12-11: 650 mg via ORAL
  Filled 2021-12-11: qty 2

## 2021-12-11 MED ORDER — DIPHENHYDRAMINE HCL 25 MG PO CAPS
25.0000 mg | ORAL_CAPSULE | Freq: Once | ORAL | Status: AC
  Administered 2021-12-11: 25 mg via ORAL
  Filled 2021-12-11: qty 1

## 2021-12-11 NOTE — Progress Notes (Signed)
Diagnosis: Iron Deficiency Anemia  Provider:  Marshell Garfinkel MD  Procedure: Infusion  IV Type: Peripheral, IV Location: R Antecubital  Venofer (Iron Sucrose), Dose: 300 mg  Infusion Start Time: 0623 am  Infusion Stop Time: 1253 pm  Post Infusion IV Care: Observation period completed and Peripheral IV Discontinued  Discharge: Condition: Good, Destination: Home . AVS provided to patient.   Performed by:  Oren Beckmann, RN

## 2021-12-16 DIAGNOSIS — N812 Incomplete uterovaginal prolapse: Secondary | ICD-10-CM | POA: Diagnosis not present

## 2021-12-16 DIAGNOSIS — J189 Pneumonia, unspecified organism: Secondary | ICD-10-CM | POA: Diagnosis not present

## 2021-12-16 DIAGNOSIS — R0689 Other abnormalities of breathing: Secondary | ICD-10-CM | POA: Diagnosis not present

## 2022-01-01 ENCOUNTER — Telehealth: Payer: Self-pay | Admitting: Cardiovascular Disease

## 2022-01-01 NOTE — Telephone Encounter (Signed)
Patient calling the office for samples of medication:   1.  What medication and dosage are you requesting samples for? ticagrelor (BRILINTA) 60 MG TABS tablet   2.  Are you currently out of this medication? Not she has a couple days left

## 2022-01-02 NOTE — Telephone Encounter (Signed)
Patient asked for brilinta '60mg'$  samples. Our clinic does not have samples at this time. Patient said if [facility] doesn't have any, she will get them from her pharmacy. She is in the donut hole.

## 2022-01-02 NOTE — Telephone Encounter (Signed)
Patient informed to pick up samples of brilinta '60mg'$  at [facility]. Patient said she will get them on Monday.

## 2022-01-20 ENCOUNTER — Inpatient Hospital Stay (HOSPITAL_BASED_OUTPATIENT_CLINIC_OR_DEPARTMENT_OTHER): Payer: Medicare HMO | Admitting: Internal Medicine

## 2022-01-20 ENCOUNTER — Inpatient Hospital Stay: Payer: Medicare HMO | Attending: Physician Assistant

## 2022-01-20 VITALS — BP 174/60 | HR 70 | Temp 98.0°F | Resp 15 | Wt 152.5 lb

## 2022-01-20 DIAGNOSIS — D509 Iron deficiency anemia, unspecified: Secondary | ICD-10-CM | POA: Diagnosis not present

## 2022-01-20 DIAGNOSIS — Z7902 Long term (current) use of antithrombotics/antiplatelets: Secondary | ICD-10-CM | POA: Insufficient documentation

## 2022-01-20 DIAGNOSIS — Z79899 Other long term (current) drug therapy: Secondary | ICD-10-CM | POA: Diagnosis not present

## 2022-01-20 DIAGNOSIS — I252 Old myocardial infarction: Secondary | ICD-10-CM | POA: Diagnosis not present

## 2022-01-20 DIAGNOSIS — I10 Essential (primary) hypertension: Secondary | ICD-10-CM | POA: Insufficient documentation

## 2022-01-20 DIAGNOSIS — M8580 Other specified disorders of bone density and structure, unspecified site: Secondary | ICD-10-CM | POA: Diagnosis not present

## 2022-01-20 DIAGNOSIS — E039 Hypothyroidism, unspecified: Secondary | ICD-10-CM | POA: Insufficient documentation

## 2022-01-20 DIAGNOSIS — Z7982 Long term (current) use of aspirin: Secondary | ICD-10-CM | POA: Insufficient documentation

## 2022-01-20 DIAGNOSIS — D500 Iron deficiency anemia secondary to blood loss (chronic): Secondary | ICD-10-CM | POA: Diagnosis not present

## 2022-01-20 LAB — CBC WITH DIFFERENTIAL (CANCER CENTER ONLY)
Abs Immature Granulocytes: 0.02 10*3/uL (ref 0.00–0.07)
Basophils Absolute: 0.1 10*3/uL (ref 0.0–0.1)
Basophils Relative: 1 %
Eosinophils Absolute: 0.5 10*3/uL (ref 0.0–0.5)
Eosinophils Relative: 6 %
HCT: 34.6 % — ABNORMAL LOW (ref 36.0–46.0)
Hemoglobin: 11.5 g/dL — ABNORMAL LOW (ref 12.0–15.0)
Immature Granulocytes: 0 %
Lymphocytes Relative: 27 %
Lymphs Abs: 2 10*3/uL (ref 0.7–4.0)
MCH: 28.3 pg (ref 26.0–34.0)
MCHC: 33.2 g/dL (ref 30.0–36.0)
MCV: 85.2 fL (ref 80.0–100.0)
Monocytes Absolute: 0.7 10*3/uL (ref 0.1–1.0)
Monocytes Relative: 10 %
Neutro Abs: 4.2 10*3/uL (ref 1.7–7.7)
Neutrophils Relative %: 56 %
Platelet Count: 312 10*3/uL (ref 150–400)
RBC: 4.06 MIL/uL (ref 3.87–5.11)
RDW: 16.6 % — ABNORMAL HIGH (ref 11.5–15.5)
WBC Count: 7.5 10*3/uL (ref 4.0–10.5)
nRBC: 0 % (ref 0.0–0.2)

## 2022-01-20 LAB — IRON AND IRON BINDING CAPACITY (CC-WL,HP ONLY)
Iron: 91 ug/dL (ref 28–170)
Saturation Ratios: 30 % (ref 10.4–31.8)
TIBC: 302 ug/dL (ref 250–450)
UIBC: 211 ug/dL (ref 148–442)

## 2022-01-20 NOTE — Progress Notes (Signed)
[facility] [phone]   Fax:([phone]) [place], [place] [address]  DIAGNOSIS: Iron deficiency anemia suspicious for gastrointestinal blood loss  PRIOR THERAPY: Iron infusion with Venofer 300 Mg IV weekly for 3 weeks completed in early November 2023  CURRENT THERAPY: Oral iron tablets with vitamin C  INTERVAL HISTORY: Cathy Reilly 81 y.o. female returns to the clinic today for follow-up visit.  The patient is feeling fine today with no concerning complaints.  She feels much better after her iron infusion with no more craving for ice.  She denied having any current chest pain, shortness of breath, cough or hemoptysis.  She has no nausea, vomiting, diarrhea or constipation.  She has no headache or visual changes.  She has no weight loss or night sweats.  She tolerated her iron infusion fairly well.  She also continues on the oral iron tablets once daily.  She is here today for evaluation and repeat blood work.  MEDICAL HISTORY: Past Medical History:  Diagnosis Date   Anxiety    Bronchitis 12/2014   CAD (coronary artery disease), native coronary artery s/p DES to LAD 12/20/2014 01/03/2015   Hypertension    Hypothyroidism    NSTEMI (non-ST elevated myocardial infarction) ([place]) 12/2014   Osteopenia    hip   Prediabetes Hgb A1C 6.0 01/03/2015   Thyroid disease    VF (ventricular fibrillation) during cath on 12/20/2014 from occluded mid LAD, s/p defib     ALLERGIES:  is allergic to codeine, crestor [rosuvastatin], lipitor [atorvastatin], septra [sulfamethoxazole-trimethoprim], simvastatin, welchol [colesevelam hcl], and prednisone.  MEDICATIONS:  Current Outpatient Medications  Medication Sig Dispense Refill   amLODipine (NORVASC) 5 MG tablet Take 1 tablet (5 mg total) by mouth daily. 90 tablet 3   aspirin 81 MG tablet Take 81 mg by mouth daily.     ELDERBERRY PO Take 50 mg by  mouth 2 (two) times daily.     ferrous sulfate 325 (65 FE) MG tablet Take 1 tablet by mouth daily with breakfast.     hydrochlorothiazide (MICROZIDE) 12.5 MG capsule TAKE 1 CAPSULE (12.5 MG TOTAL) BY MOUTH DAILY. 90 capsule 3   levothyroxine (SYNTHROID, LEVOTHROID) 88 MCG tablet Take 88 mcg by mouth daily before breakfast.     lisinopril (ZESTRIL) 30 MG tablet Take 30 mg by mouth daily.      metoprolol tartrate (LOPRESSOR) 25 MG tablet TAKE 1 AND 1/2 TABLETS TWO TIMES DAILY 270 tablet 1   nitroGLYCERIN (NITROSTAT) 0.4 MG SL tablet Place 1 tablet (0.4 mg total) under the tongue every 5 (five) minutes x 3 doses as needed for chest pain. 25 tablet 1   potassium chloride SA (KLOR-CON M) 20 MEQ tablet Take 1 tablet (20 mEq total) by mouth 2 (two) times daily. 20 tablet 0   rosuvastatin (CRESTOR) 20 MG tablet Take 1 tablet (20 mg total) by mouth daily. 30 tablet 5   ticagrelor (BRILINTA) 60 MG TABS tablet Take 1 tablet (60 mg total) by mouth 2 (two) times daily. 60 tablet 11   vitamin B-12 (CYANOCOBALAMIN) 500 MCG tablet Take 500 mcg by mouth daily.     No current facility-administered medications for this visit.    SURGICAL HISTORY:  Past Surgical History:  Procedure Laterality Date   APPENDECTOMY     CARDIAC CATHETERIZATION N/A 12/20/2014   Procedure: Left Heart Cath and Coronary Angiography;  Surgeon: Troy Sine,  MD;  Location: [facility];  Service: Cardiovascular;  Laterality: N/A;   CARDIAC CATHETERIZATION N/A 12/20/2014   Procedure: Coronary Stent Intervention;  Surgeon: Troy Sine, MD;  Location: [facility];  Service: Cardiovascular;  Laterality: N/A;   CARDIAC CATHETERIZATION N/A 12/24/2014   Procedure: Left Heart Cath and Coronary Angiography;  Surgeon: Jettie Booze, MD;  Location: [facility];  Service: Cardiovascular;  Laterality: N/A;   CHOLECYSTECTOMY     CORONARY ANGIOPLASTY WITH STENT PLACEMENT     TONSILLECTOMY      REVIEW OF SYSTEMS:  A  comprehensive review of systems was negative.   PHYSICAL EXAMINATION: General appearance: alert, cooperative, and no distress Head: Normocephalic, without obvious abnormality, atraumatic Neck: no adenopathy, no JVD, supple, symmetrical, trachea midline, and thyroid not enlarged, symmetric, no tenderness/mass/nodules Lymph nodes: Cervical, supraclavicular, and axillary nodes normal. Resp: clear to auscultation bilaterally Back: symmetric, no curvature. ROM normal. No CVA tenderness. Cardio: regular rate and rhythm, S1, S2 normal, no murmur, click, rub or gallop GI: soft, non-tender; bowel sounds normal; no masses,  no organomegaly Extremities: extremities normal, atraumatic, no cyanosis or edema  ECOG PERFORMANCE STATUS: 0 - Asymptomatic  Blood pressure (!) 174/60, pulse 70, temperature 98 F (36.7 C), temperature source Oral, resp. rate 15, weight 152 lb 8 oz (69.2 kg), SpO2 98 %.  LABORATORY DATA: Lab Results  Component Value Date   WBC 7.5 01/20/2022   HGB 11.5 (L) 01/20/2022   HCT 34.6 (L) 01/20/2022   MCV 85.2 01/20/2022   PLT 312 01/20/2022      Chemistry      Component Value Date/Time   NA 136 11/19/2021 1325   NA 142 03/18/2018 0907   K 3.1 (L) 11/19/2021 1325   CL 100 11/19/2021 1325   CO2 29 11/19/2021 1325   BUN 12 11/19/2021 1325   BUN 11 03/18/2018 0907   CREATININE 0.63 11/19/2021 1325   CREATININE 0.85 11/18/2021 1311   CREATININE 0.61 04/20/2016 1039      Component Value Date/Time   CALCIUM 9.2 11/19/2021 1325   ALKPHOS 74 11/18/2021 1311   AST 18 11/18/2021 1311   ALT 11 11/18/2021 1311   BILITOT 1.0 11/18/2021 1311       RADIOGRAPHIC STUDIES: No results found.  ASSESSMENT AND PLAN: This is a very pleasant 82 years old white female with iron deficiency anemia likely secondary to gastrointestinal blood loss but the stool for Hemoccult was negative. She has extensive studies to evaluate the underlying etiology of her anemia that were unremarkable  except for the low serum ferritin and iron. She received iron infusion with Venofer 300 Mg IV weekly for 3 weeks and tolerated it fairly well. Repeat CBC today showed improvement of her anemia with hemoglobin up to 11.5 and hematocrit of 34.6%. Iron study and ferritin are still pending. I recommended for the patient to continue with over-the-counter oral iron tablet with vitamin C for now. I will see her back for follow-up visit in 3 months for evaluation with repeat CBC, iron study and ferritin. The patient was advised to call immediately if she has any other concerning symptoms in the interval. The patient voices understanding of current disease status and treatment options and is in agreement with the current care plan.  All questions were answered. The patient knows to call the clinic with any problems, questions or concerns. We can certainly see the patient much sooner if necessary.  The total time spent in the appointment  was 20 minutes.  Disclaimer: This note was dictated with voice recognition software. Similar sounding words can inadvertently be transcribed and may not be corrected upon review.

## 2022-01-21 LAB — FERRITIN: Ferritin: 64 ng/mL (ref 11–307)

## 2022-01-22 DIAGNOSIS — N393 Stress incontinence (female) (male): Secondary | ICD-10-CM | POA: Diagnosis not present

## 2022-02-16 ENCOUNTER — Ambulatory Visit
Admission: EM | Admit: 2022-02-16 | Discharge: 2022-02-16 | Disposition: A | Discharge status: Home / Self Care | Payer: Medicare HMO | Attending: Physician Assistant | Admitting: Physician Assistant

## 2022-02-16 DIAGNOSIS — J0100 Acute maxillary sinusitis, unspecified: Secondary | ICD-10-CM | POA: Diagnosis not present

## 2022-02-16 MED ORDER — AMOXICILLIN-POT CLAVULANATE 875-125 MG PO TABS: 1.0000 | ORAL_TABLET | Freq: Two times a day (BID) | ORAL | 0 refills | Status: AC

## 2022-02-16 NOTE — ED Triage Notes (Signed)
Pt presents with non productive cough, nasal congestion, sinus pain, and sore throat X 7 days.

## 2022-02-16 NOTE — ED Provider Notes (Signed)
[facility]    CSN: [id number] Arrival date & time: 02/16/22  1205      History   Chief Complaint Chief Complaint  Patient presents with   URI    HPI Cathy Reilly is a 82 y.o. female.   Patient here today for evaluation of nonproductive cough, nasal congestion, sinus pain and sore throat that started 7 days ago.  She reports significant maxillary sinus pressure and discomfort.  She has had an intermittent cough for a while.  She has not had any fever.  She denies any vomiting or diarrhea.  The history is provided by the patient.  URI Presenting symptoms: congestion, cough and sore throat   Presenting symptoms: no ear pain and no fever   Associated symptoms: no wheezing     Past Medical History:  Diagnosis Date   Anxiety    Bronchitis 12/2014   CAD (coronary artery disease), native coronary artery s/p DES to LAD 12/20/2014 01/03/2015   Hypertension    Hypothyroidism    NSTEMI (non-ST elevated myocardial infarction) ([place]) 12/2014   Osteopenia    hip   Prediabetes Hgb A1C 6.0 01/03/2015   Thyroid disease    VF (ventricular fibrillation) during cath on 12/20/2014 from occluded mid LAD, s/p defib     Patient Active Problem List   Diagnosis Date Noted   Iron deficiency anemia 11/18/2021   Chest pain 03/28/2018   Zoster 03/28/2018   Hypokalemia 03/28/2018   Hyperlipidemia LDL goal <70 04/18/2015   CAD S/P LAD DES 12/20/14 01/10/2015   Chest pain with moderate risk for cardiac etiology - more consistent with DHF. 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 artery    History of NSTEMI (non-ST elevated myocardial infarction)  ([place]) 12/20/2014   VF (ventricular fibrillation) during cath on 12/20/2014 from occluded mid LAD, s/p defib     Past Surgical History:  Procedure Laterality Date   APPENDECTOMY     CARDIAC CATHETERIZATION N/A 12/20/2014   Procedure: Left Heart Cath and Coronary Angiography;  Surgeon: Troy Sine, MD;  Location: [facility];  Service: Cardiovascular;  Laterality: N/A;   CARDIAC CATHETERIZATION N/A 12/20/2014   Procedure: Coronary Stent Intervention;  Surgeon: Troy Sine, MD;  Location: [facility];  Service: Cardiovascular;  Laterality: N/A;   CARDIAC CATHETERIZATION N/A 12/24/2014   Procedure: Left Heart Cath and Coronary Angiography;  Surgeon: Jettie Booze, MD;  Location: [facility];  Service: Cardiovascular;  Laterality: N/A;   CHOLECYSTECTOMY     CORONARY ANGIOPLASTY WITH STENT PLACEMENT     TONSILLECTOMY      OB History   No obstetric history on file.      Home Medications    Prior to Admission medications   Medication Sig Start Date End Date Taking? Authorizing Provider  amoxicillin-clavulanate (AUGMENTIN) 875-125 MG tablet Take 1 tablet by mouth every 12 (twelve) hours. 02/16/22  Yes Francene Finders, PA-C  amLODipine (NORVASC) 5 MG tablet Take 1 tablet (5 mg total) by mouth daily. 03/26/20   Troy Sine, MD  aspirin 81 MG tablet Take 81 mg by mouth daily.    [provider]  ELDERBERRY PO Take 50 mg by mouth 2 (two) times daily.    [provider]  ferrous sulfate 325 (65 FE) MG tablet Take 1 tablet by mouth daily with breakfast.    [provider]  hydrochlorothiazide (MICROZIDE) 12.5 MG capsule TAKE 1 CAPSULE (12.5 MG TOTAL) BY MOUTH DAILY. 04/25/19   Troy Sine, MD  levothyroxine (SYNTHROID, LEVOTHROID) 88 MCG tablet Take 88 mcg by mouth daily before breakfast.    [provider]  lisinopril (ZESTRIL) 30 MG tablet Take 30 mg by mouth daily.  09/21/19   [provider]  metoprolol  tartrate (LOPRESSOR) 25 MG tablet TAKE 1 AND 1/2 TABLETS TWO TIMES DAILY 07/13/19   Troy Sine, MD  nitroGLYCERIN (NITROSTAT) 0.4 MG SL tablet Place 1 tablet (0.4 mg total) under the tongue every 5 (five) minutes x 3 doses as needed for chest pain. 08/29/19   Troy Sine, MD  potassium chloride SA (KLOR-CON M) 20 MEQ tablet Take 1 tablet (20 mEq total) by mouth 2 (two) times daily. 11/18/21   Heilingoetter, Cassandra L, PA-C  rosuvastatin (CRESTOR) 20 MG tablet Take 1 tablet (20 mg total) by mouth daily. 09/30/18   Troy Sine, MD  ticagrelor (BRILINTA) 60 MG TABS tablet Take 1 tablet (60 mg total) by mouth 2 (two) times daily. 12/03/21   Troy Sine, MD  vitamin B-12 (CYANOCOBALAMIN) 500 MCG tablet Take 500 mcg by mouth daily.    [provider]    Family History Family History  Problem Relation Age of Onset   Hypertension Mother    Hypertension Father    Hypertension Sister    Cancer Sister    Hypertension Brother    Cancer Brother    CVA Brother    Transient ischemic attack Sister     Social History Social History   Tobacco Use   Smoking status: Never   Smokeless tobacco: Never  Vaping Use   Vaping Use: Never used  Substance Use Topics   Alcohol use: No    Alcohol/week: 0.0 standard drinks of alcohol   Drug use: No     Allergies   Codeine, Crestor [rosuvastatin], Lipitor [atorvastatin], Septra [sulfamethoxazole-trimethoprim], Simvastatin, Welchol [colesevelam hcl], and Prednisone   Review of Systems Review of Systems  Constitutional:  Negative for chills and fever.  HENT:  Positive for congestion, sinus pressure and sore throat. Negative for ear pain.   Eyes:  Negative for discharge and redness.  Respiratory:  Positive for cough. Negative for shortness of breath and wheezing.   Gastrointestinal:  Negative for abdominal pain, diarrhea, nausea and vomiting.     Physical Exam Triage Vital Signs ED Triage Vitals [02/16/22 1232]  Enc Vitals  Group     BP (!) 164/75     Pulse Rate 64     Resp 17     Temp 98 F (36.7 C)     Temp Source Oral     SpO2 94 %     Weight      Height      Head Circumference      Peak Flow      Pain Score 6     Pain Loc      Pain Edu?      Excl. in [place]?    No data found.  Updated Vital Signs BP (!) 164/75 (BP Location: Left Arm)   Pulse 64   Temp 98 F (36.7 C) (Oral)   Resp 17   SpO2 94%  Physical Exam Vitals and nursing note reviewed.  Constitutional:      General: She is not in acute distress.    Appearance: Normal appearance. She is not ill-appearing.  HENT:     Head: Normocephalic and atraumatic.     Nose: Congestion present.     Mouth/Throat:     Mouth: Mucous membranes are moist.     Pharynx: No oropharyngeal exudate or posterior oropharyngeal erythema.  Eyes:     Conjunctiva/sclera: Conjunctivae normal.  Cardiovascular:     Rate and Rhythm: Normal rate and regular rhythm.     Heart sounds: Normal heart sounds. No murmur heard. Pulmonary:     Effort: Pulmonary effort is normal. No respiratory distress.     Breath sounds: Normal breath sounds. No wheezing, rhonchi or rales.  Skin:    General: Skin is warm and dry.  Neurological:     Mental Status: She is alert.  Psychiatric:        Mood and Affect: Mood normal.        Thought Content: Thought content normal.      UC Treatments / Results  Labs (all labs ordered are listed, but only abnormal results are displayed) Labs Reviewed - No data to display  EKG   Radiology No results found.  Procedures Procedures (including critical care time)  Medications Ordered in UC Medications - No data to display  Initial Impression / Assessment and Plan / UC Course  I have reviewed the triage vital signs and the nursing notes.  Pertinent labs & imaging results that were available during my care of the patient were reviewed by me and considered in my medical decision making (see chart for details).    Will treat  to cover sinusitis with Augmentin.  Recommended follow-up if no gradual improvement of symptoms, specifically cough given history of pneumonia.  Patient expresses understanding.  Final Clinical Impressions(s) / UC Diagnoses   Final diagnoses:  Acute maxillary sinusitis, recurrence not specified   Discharge Instructions   None    ED Prescriptions     Medication Sig Dispense Auth. Provider   amoxicillin-clavulanate (AUGMENTIN) 875-125 MG tablet Take 1 tablet by mouth every 12 (twelve) hours. 14 tablet Francene Finders, PA-C      PDMP not reviewed this encounter.   Francene Finders, PA-C 02/16/22 1256

## 2022-02-18 IMAGING — CR DG CHEST 2V
2 series · 2 of 2 positions shown · non-contrast
Comparison: 03/28/2018

CLINICAL DATA: Chest pain

EXAM:
CHEST - 2 VIEW

[w chest pa]
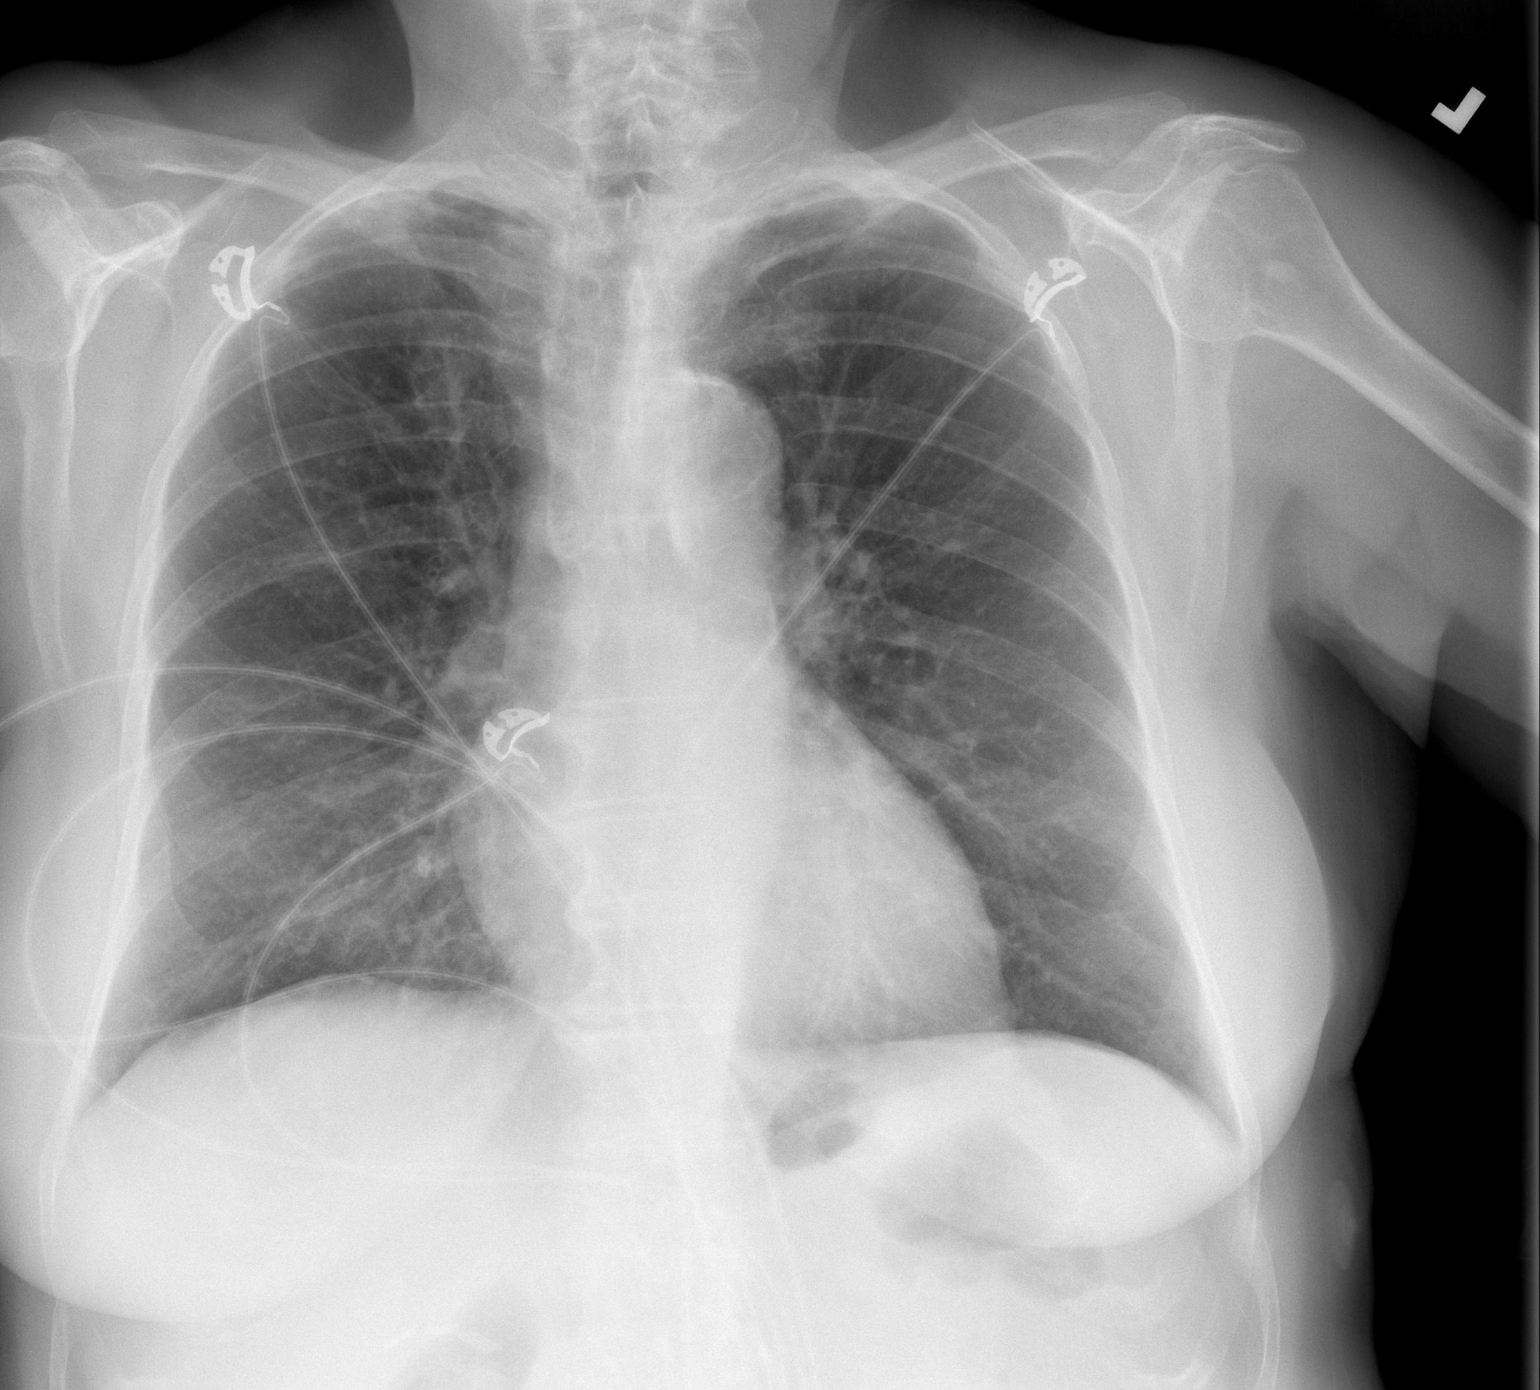

[w chest lat]
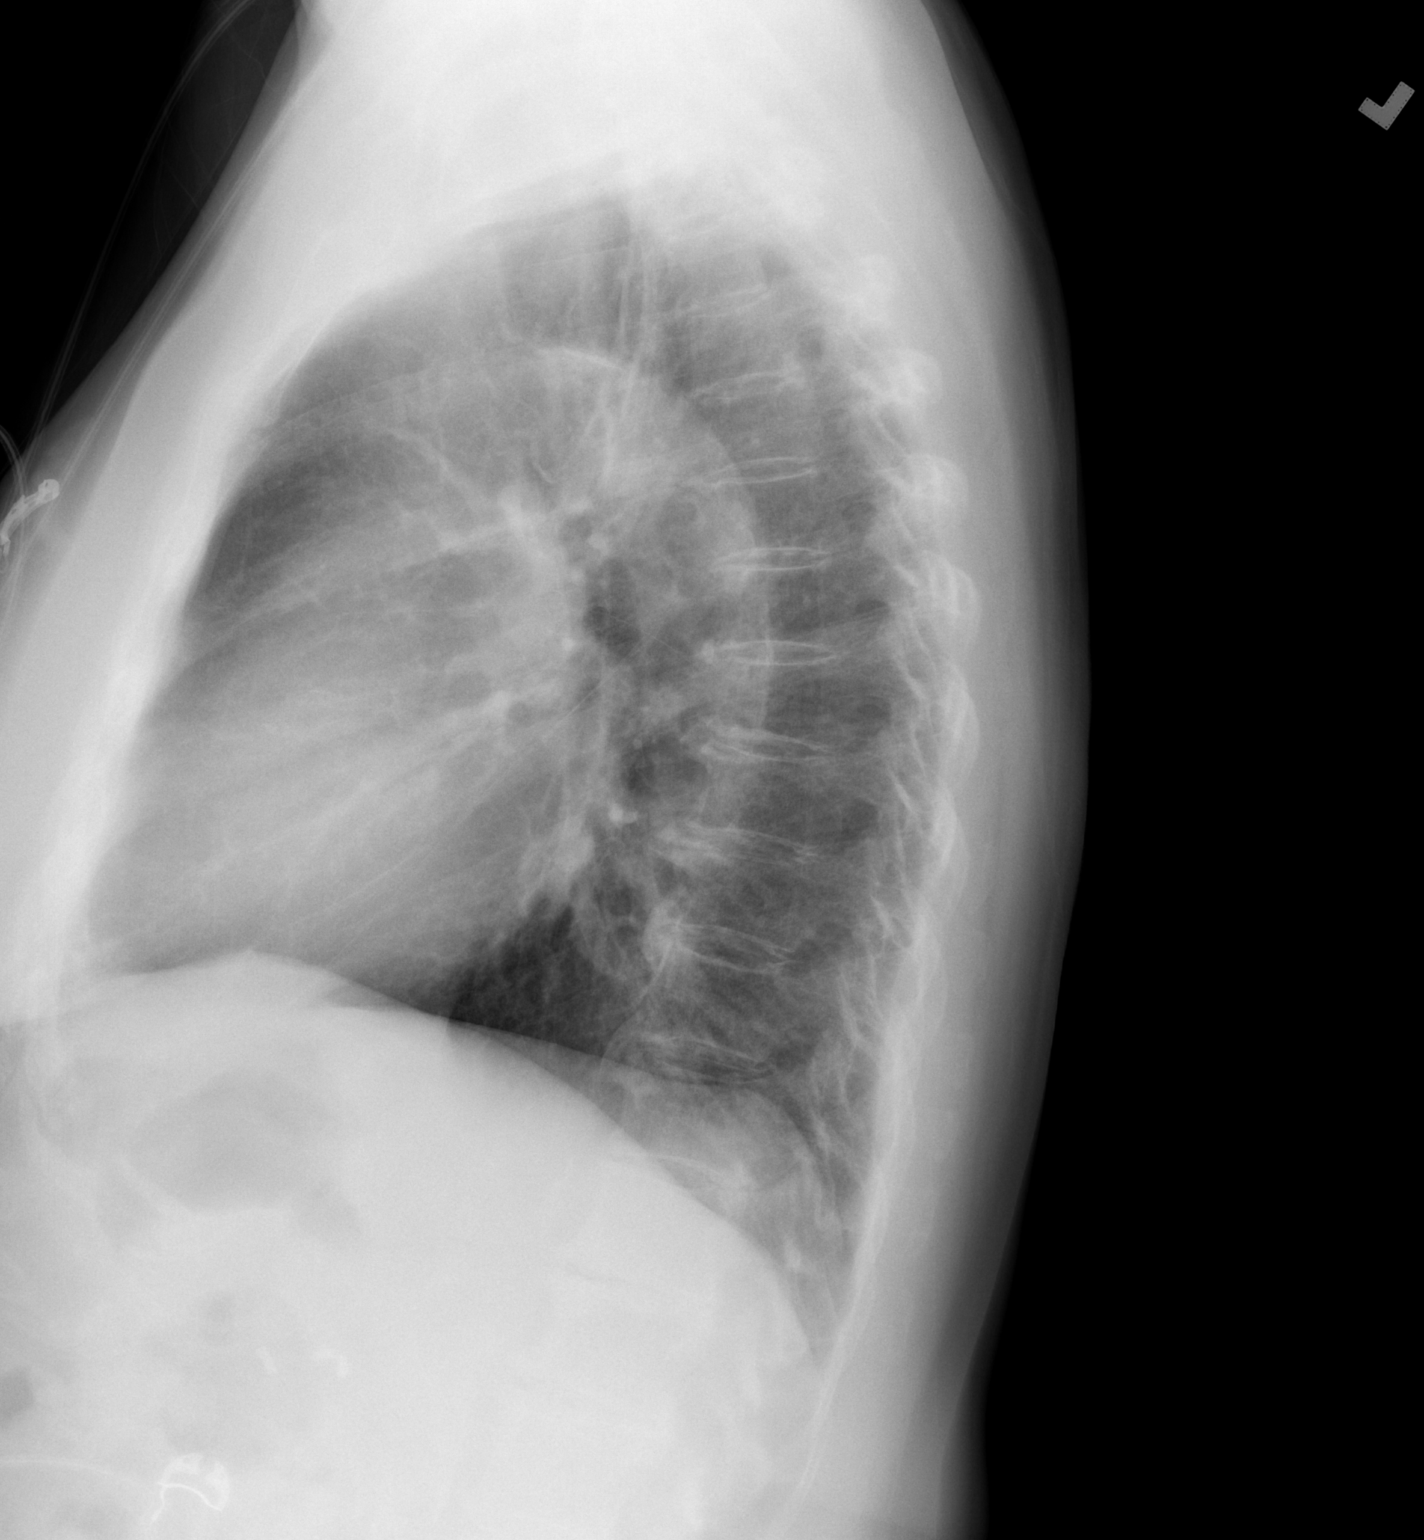

[2 of 2 positions shown; findings below may reference images not displayed]

FINDINGS: The heart size and mediastinal contours are within normal limits.
Both lungs are clear. The visualized skeletal structures are
unremarkable.
IMPRESSION: No active cardiopulmonary disease.

## 2022-02-25 ENCOUNTER — Ambulatory Visit: Payer: Medicare HMO | Attending: Cardiovascular Disease | Admitting: Cardiovascular Disease

## 2022-02-25 ENCOUNTER — Encounter: Payer: Self-pay | Admitting: Cardiovascular Disease

## 2022-02-25 DIAGNOSIS — I10 Essential (primary) hypertension: Secondary | ICD-10-CM

## 2022-02-25 DIAGNOSIS — E785 Hyperlipidemia, unspecified: Secondary | ICD-10-CM | POA: Diagnosis not present

## 2022-02-25 DIAGNOSIS — I214 Non-ST elevation (NSTEMI) myocardial infarction: Secondary | ICD-10-CM

## 2022-02-25 DIAGNOSIS — E039 Hypothyroidism, unspecified: Secondary | ICD-10-CM | POA: Diagnosis not present

## 2022-02-25 DIAGNOSIS — I2510 Atherosclerotic heart disease of native coronary artery without angina pectoris: Secondary | ICD-10-CM

## 2022-02-25 DIAGNOSIS — Z9861 Coronary angioplasty status: Secondary | ICD-10-CM | POA: Diagnosis not present

## 2022-02-25 NOTE — Patient Instructions (Signed)
   Follow-Up: At [facility], you and your health needs are our priority.  As part of our continuing mission to provide you with exceptional heart care, we have created designated Provider Care Teams.  These Care Teams include your primary Cardiologist (physician) and Advanced Practice Providers (APPs -  Physician Assistants and Nurse Practitioners) who all work together to provide you with the care you need, when you need it.  We recommend signing up for the patient portal called "MyChart".  Sign up information is provided on this After Visit Summary.  MyChart is used to connect with patients for Virtual Visits (Telemedicine).  Patients are able to view lab/test results, encounter notes, upcoming appointments, etc.  Non-urgent messages can be sent to your provider as well.   To learn more about what you can do with MyChart, go to [email or web address].    Your next appointment:   12 month(s)  Provider:   Shelva Majestic, MD

## 2022-02-25 NOTE — Progress Notes (Signed)
Patient ID: Cathy Reilly, female   DOB: 1940-04-15, 82 y.o.   MRN: [id number]      PCP: Dr. Carol Ada  HPI: Cathy Reilly is a 82 y.o. female who presents to the office today for a 13 month follow up cardiology evaluation.   Cathy Reilly has a history of hypertension and hypothyroidism.  She was admitted in the early morning on 12/19/2014 with a symptom complex suggestive of unstable angina.  She ruled in for non-ST segment elevation MI with initial troponin of 5.  She was referred for cardiac catheterization.  In the catheterization laboratory she developed an episode of VT/VF and required defibrillation.  She underwent successful intervention to a subtotally/totally occluded LAD which had diffuse proximal to mid disease and was successfully stented with a 3033 mm Xience Alpine stent.  She was also felt to have a possible spontaneous dissection with a 90% distal intermediate stenosis ulcerated plaque.  She was maintained on anticoagulation and several days later was brought back to the catheterization laboratory.  Her LAD stent was widely patent and there was resolution of her prior intermediate stenosis.  Subsequent, she has remained stable.  She is participating in phase II cardiac rehabilitation.  She denies recurrent anginal symptoms.  She admits to being active.  She is unaware of any palpitations.  When I saw her in October 2017 she had been on aspirin 81 mg and Brilinta 90 twice a day for dual antiplatelet therapy. I discussed the [facility]asus trial data and ultimately switched her to 60 mg, Brilinta twice a day, which she has been taking and tolerating.  When I saw her in November 2018 she was stable and denied any episodes of chest pain or palpitations.  She stays active taking care of her 9 grandchildren and has 5 great-grandchildren.  She has been on amlodipine 5 mg, lisinopril 20 mg, metoprolol 37.5 mg twice a day for hypertension and post MI regimen.  She was on aspirin and low-dose  Brilinta at 60 mg twice a day for dual antiplatelet therapy.  She has been on Crestor mg daily for hyperlipidemia with target LDL less than 70.  She states her blood pressure typically at home ranges in the 130 to 160 range.    When seen in May 2019  she denied any recurrent anginal symptomatology.   Her blood pressure at home was running in the 140 - 150 range.    Cholesterol was 118 LDL 59.  She was on amlodipine 7.5 mg , metoprolol 37.5 mg twice a day and lisinopril 20 mg for hypertension.  She has hypothyroidism on levothyroxine 88 mcg.  Not had any bleeding on aspirin and Brilinta.  I discussed with her new hypertensive guidelines and recommended further titration of amlodipine up to 10 mg daily.  I saw her in February 2020.  She had developed swelling on the increased amlodipine dose and this had been reduced back down to 5 mg with HCTZ 12.5 mg being added to her regimen.  Edema had resolved.  I saw her in September 2020 at which time she continued to feel well. Her blood pressure most of the time typically is over 130.  She denied chest pain or shortness of breath or palpitations.  She was taking amlodipine 5 mg, HCTZ 12.5 mg, lisinopril 30 mg and metoprolol 37.5 mg twice a day for blood pressure control.  During that evaluation, I recommended slight titration of lisinopril from 20 mg up to 30 mg daily.  I saw  her on December 25, 2019 at which time she felt well.  Her blood pressure had improved with the increase in her lisinopril dose.   Approximately 3 months ago she did experience an episode of midepigastric discomfort which she felt most likely was due to gas.  She denies any exertional chest pain symptomatology.  She denies palpitations.  She continues to be on aspirin and low-dose Brilinta following her VT VF arrest.  She is on amlodipine 5 mg, HCTZ 12.5 mg, lisinopril 30 mg in addition to metoprolol tartrate 37.5 mg twice a day.  She continues to be on rosuvastatin 20 mg daily for  hyperlipidemia.  In February 2021 LDL cholesterol was 61.  I last saw her on January 16, 2021. She denied hest pain or shortness of breathShe was to see Dr. Carol Ada who will be checking her laboratory in January 2023.  She had undergone knee surgery with initial left torn meniscus and ultimate knee replacement surgery.  She tolerated surgery well.  She denies chest pain or shortness of breath.  She continues to be on amlodipine 5 mg, HCTZ 12.5 mg, lisinopril 30 mg daily and metoprolol tartrate 37.5 mg twice a day.  She is on levothyroxine for hypothyroidism.  She continues to be on rosuvastatin 20 mg daily.  During that evaluation I recommended she undergo follow-up echo Doppler study.  On February 13, 2021, a follow-up echo demonstrated hyperdynamic LV function with EF 65 to 70%.  There was mild LA dilation.  Valves were not normal.  She had a telephone virtual visit by Stephan Minister, NP in September 2023 for preoperative cardiovascular risk assessment prior to undergoing hysterectomy.  She tells me she had undergone left knee replacement surgery in 2022 and she tolerated her September 2023 hysterectomy without cardiovascular compromise.  She had to be away from her house for 4 months for repair issues but has been back living at home in December.  Presently she denies chest pain or shortness of breath.  She continues to see Dr. Carol Ada for primary care who will be rechecking her laboratory.  Most recent laboratory in October 2023 showed creatinine 0.87.  Lipid studies in March 2023 showed LDL cholesterol at 48.  She continues to be on rosuvastatin 20 mg for lipid control.  Her blood pressure has been stable on amlodipine 5 mg, HCTZ one half of a 12.5 mg pill, lisinopril 30 mg and metoprolol tartrate 37.5 mg daily.  She continues to be on DAPT with aspirin and reduced dose of Brilinta 60 mg twice a day.  She is on levothyroxine 88 mcg for hypothyroidism.  She presents for evaluation.  Past  Medical History:  Diagnosis Date   Anxiety    Bronchitis 12/2014   CAD (coronary artery disease), native coronary artery s/p DES to LAD 12/20/2014 01/03/2015   Hypertension    Hypothyroidism    NSTEMI (non-ST elevated myocardial infarction) ([place]) 12/2014   Osteopenia    hip   Prediabetes Hgb A1C 6.0 01/03/2015   Thyroid disease    VF (ventricular fibrillation) during cath on 12/20/2014 from occluded mid LAD, s/p defib     Past Surgical History:  Procedure Laterality Date   APPENDECTOMY     CARDIAC CATHETERIZATION N/A 12/20/2014   Procedure: Left Heart Cath and Coronary Angiography;  Surgeon: Troy Sine, MD;  Location: [facility];  Service: Cardiovascular;  Laterality: N/A;   CARDIAC CATHETERIZATION N/A 12/20/2014   Procedure: Coronary Stent Intervention;  Surgeon: Troy Sine,  MD;  Location: [facility];  Service: Cardiovascular;  Laterality: N/A;   CARDIAC CATHETERIZATION N/A 12/24/2014   Procedure: Left Heart Cath and Coronary Angiography;  Surgeon: Jettie Booze, MD;  Location: [facility];  Service: Cardiovascular;  Laterality: N/A;   CHOLECYSTECTOMY     CORONARY ANGIOPLASTY WITH STENT PLACEMENT     TONSILLECTOMY      Allergies  Allergen Reactions   Codeine Nausea And Vomiting   Crestor [Rosuvastatin] Other (See Comments)    myaglia   Lipitor [Atorvastatin] Other (See Comments)    myaglia   Septra [Sulfamethoxazole-Trimethoprim] Nausea And Vomiting   Simvastatin Other (See Comments)    myaglia   Welchol [Colesevelam Hcl] Other (See Comments)    myaglia   Prednisone Palpitations    Current Outpatient Medications  Medication Sig Dispense Refill   amLODipine (NORVASC) 5 MG tablet Take 1 tablet (5 mg total) by mouth daily. 90 tablet 3   amoxicillin-clavulanate (AUGMENTIN) 875-125 MG tablet Take 1 tablet by mouth every 12 (twelve) hours. 14 tablet 0   aspirin 81 MG tablet Take 81 mg by mouth daily.     ELDERBERRY PO Take 50 mg by mouth  2 (two) times daily.     ferrous sulfate 325 (65 FE) MG tablet Take 1 tablet by mouth daily with breakfast.     hydrochlorothiazide (MICROZIDE) 12.5 MG capsule TAKE 1 CAPSULE (12.5 MG TOTAL) BY MOUTH DAILY. 90 capsule 3   levothyroxine (SYNTHROID, LEVOTHROID) 88 MCG tablet Take 88 mcg by mouth daily before breakfast.     lisinopril (ZESTRIL) 30 MG tablet Take 30 mg by mouth daily.      metoprolol tartrate (LOPRESSOR) 25 MG tablet TAKE 1 AND 1/2 TABLETS TWO TIMES DAILY 270 tablet 1   nitroGLYCERIN (NITROSTAT) 0.4 MG SL tablet Place 1 tablet (0.4 mg total) under the tongue every 5 (five) minutes x 3 doses as needed for chest pain. 25 tablet 1   potassium chloride SA (KLOR-CON M) 20 MEQ tablet Take 1 tablet (20 mEq total) by mouth 2 (two) times daily. 20 tablet 0   rosuvastatin (CRESTOR) 20 MG tablet Take 1 tablet (20 mg total) by mouth daily. 30 tablet 5   ticagrelor (BRILINTA) 60 MG TABS tablet Take 1 tablet (60 mg total) by mouth 2 (two) times daily. 60 tablet 11   vitamin B-12 (CYANOCOBALAMIN) 500 MCG tablet Take 500 mcg by mouth daily.     No current facility-administered medications for this visit.    Social History   Socioeconomic History   Marital status: Married    Spouse name: Not on file   Number of children: Not on file   Years of education: Not on file   Highest education level: Not on file  Occupational History   Not on file  Tobacco Use   Smoking status: Never   Smokeless tobacco: Never  Vaping Use   Vaping Use: Never used  Substance and Sexual Activity   Alcohol use: No    Alcohol/week: 0.0 standard drinks of alcohol   Drug use: No   Sexual activity: Not on file  Other Topics Concern   Not on file  Social History Narrative   Not on file   Social Determinants of Health   Financial Resource Strain: Not on file  Food Insecurity: Not on file  Transportation Needs: Not on file  Physical Activity: Not on file  Stress: Not on file  Social Connections: Not on file   Intimate Partner Violence:  Not on file    Family History  Problem Relation Age of Onset   Hypertension Mother    Hypertension Father    Hypertension Sister    Cancer Sister    Hypertension Brother    Cancer Brother    CVA Brother    Transient ischemic attack Sister     ROS General: Negative; No fevers, chills, or night sweats HEENT: Negative; No changes in vision or hearing, sinus congestion, difficulty swallowing Pulmonary: Negative; No cough, wheezing, shortness of breath, hemoptysis Cardiovascular: See HPI: No chest pain, presyncope, syncope, palpatations GI: Negative; No nausea, vomiting, diarrhea, or abdominal pain GU: Status post hysterectomy Musculoskeletal: Repair of left torn meniscus and then subsequent knee replacement Hematologic: Negative; no easy bruising, bleeding Endocrine: Negative; no heat/cold intolerance; no diabetes, Neuro: Negative; no changes in balance, headaches Skin: Negative; No rashes or skin lesions Psychiatric: Negative; No behavioral problems, depression Sleep: Negative; No snoring,  daytime sleepiness, hypersomnolence, bruxism, restless legs, hypnogognic hallucinations. Other comprehensive 14 point system review is negative   Physical Exam BP 118/60 Comment: left arm  Pulse (!) 59   Ht '5\' 1"'$  (1.549 m)   Wt 152 lb 9.6 oz (69.2 kg)   SpO2 97%   BMI 28.83 kg/m    Repeat blood pressure by me 128/64  Wt Readings from Last 3 Encounters:  02/25/22 152 lb 9.6 oz (69.2 kg)  01/20/22 152 lb 8 oz (69.2 kg)  12/11/21 152 lb 12.8 oz (69.3 kg)   General: Alert, oriented, no distress.  Skin: normal turgor, no rashes, warm and dry HEENT: Normocephalic, atraumatic. Pupils equal round and reactive to light; sclera anicteric; extraocular muscles intact;  Nose without nasal septal hypertrophy Mouth/Parynx benign; Mallinpatti scale 3 Neck: No JVD, no carotid bruits; normal carotid upstroke Lungs: clear to ausculatation and percussion; no wheezing or  rales Chest wall: without tenderness to palpitation Heart: PMI not displaced, RRR, s1 s2 normal, 1/6 systolic murmur, no diastolic murmur, no rubs, gallops, thrills, or heaves Abdomen: soft, nontender; no hepatosplenomehaly, BS+; abdominal aorta nontender and not dilated by palpation. Back: no CVA tenderness Pulses 2+ Musculoskeletal: full range of motion, normal strength, no joint deformities Extremities: no clubbing cyanosis or edema, Homan's sign negative  Neurologic: grossly nonfocal; Cranial nerves grossly wnl Psychologic: Normal mood and affect   January 24, 2024ECG (independently read by me): Sinus bradycardia at 59, small nondiagnotic Q  waves inferiorly   January 16, 2021 ECG (independently read by me): Sinus bradycardia at 55; no ectopy, normal intervals  September 2020 ECG (independently read by me): Normal sinus rhythm at 62 bpm.  No ectopy.  QTc interval 472 ms.  February 2020 ECG (independently read by me): Normal sinus rhythm at 61 bpm.  No ectopy.  Normal intervals.  May 2019 ECG (independently read by me): Normal sinus rhythm at 65 bpm.  No ectopy.  Normal intervals.  November 2018 ECG (independently read by me): Sinus bradycardia 57 bpm.  Normal intervals.  No ectopy.  No ST segment changes.  March 2018 ECG (independently read by me): Normal sinus rhythm at 60 bpm.  Preserved R waves inferiorly with small nondiagnostic Q waves in the 3  11/26/2015 ECG (independently read by me): Sinus bradycardia 59 bpm.  No ECG evidence of her prior MI.  Preserved R waves inferiorly with small Q wave in 3.  ECG (independently read by me):  Sinus bradycardia 59 bpm.  No ectopy.  No ECG criteria for her MI.  LABS:  Latest Ref Rng & Units 11/19/2021    1:25 PM 11/18/2021    1:11 PM 08/29/2019    2:58 PM  BMP  Glucose 70 - 99 mg/dL 103  174  116   BUN 8 - 23 mg/dL '12  14  28   '$ Creatinine 0.44 - 1.00 mg/dL 0.63  0.85  0.67   Sodium 135 - 145 mmol/L 136  136  135   Potassium  3.5 - 5.1 mmol/L 3.1  2.7  3.2   Chloride 98 - 111 mmol/L 100  97  97   CO2 22 - 32 mmol/L 29  32  29   Calcium 8.9 - 10.3 mg/dL 9.2  9.5  9.2         Latest Ref Rng & Units 11/18/2021    1:11 PM 03/28/2018    9:33 PM 03/18/2018    9:07 AM  Hepatic Function  Total Protein 6.5 - 8.1 g/dL 7.3  7.3  7.0   Albumin 3.5 - 5.0 g/dL 4.2  3.9  4.5   AST 15 - 41 U/L 18  37  20   ALT 0 - 44 U/L '11  30  15   '$ Alk Phosphatase 38 - 126 U/L 74  62  71   Total Bilirubin 0.3 - 1.2 mg/dL 1.0  1.0  1.6        Latest Ref Rng & Units 01/20/2022    3:07 PM 11/18/2021    1:11 PM 08/29/2019    2:58 PM  CBC  WBC 4.0 - 10.5 K/uL 7.5  9.9  8.5   Hemoglobin 12.0 - 15.0 g/dL 11.5  9.5  12.4   Hematocrit 36.0 - 46.0 % 34.6  30.3  36.8   Platelets 150 - 400 K/uL 312  477  275    Lab Results  Component Value Date   MCV 85.2 01/20/2022   MCV 80.2 11/18/2021   MCV 88.5 08/29/2019    Lab Results  Component Value Date   TSH 1.490 03/18/2018    BNP    Component Value Date/Time   BNP 552.5 (H) 01/03/2015 1000    ProBNP No results found for: "PROBNP"   Lipid Panel     Component Value Date/Time   CHOL 111 03/18/2018 0907   TRIG 81 03/18/2018 0907   HDL 46 03/18/2018 0907   CHOLHDL 2.4 03/18/2018 0907   CHOLHDL 2.8 04/20/2016 1039   VLDL 21 04/20/2016 1039   LDLCALC 49 03/18/2018 0907     RADIOLOGY: No results found.  IMPRESSION:  1. History of NSTEMI (non-ST elevated myocardial infarction) ([place])   2. CAD S/P percutaneous coronary angioplasty   3. Essential hypertension   4. Hyperlipidemia LDL goal <70   5. Hypothyroidism, unspecified type     ASSESSMENT AND PLAN: Cathy Reilly is an 82 year-old female who suffered a non-ST segment elevation MI in November 2016 and underwent successful stenting of a subtotally occluded LAD extending proximally into the mid vessel. She developed VT/VF requiring defibrillation in the laboratory.  At the time she was also felt to have a possible  ulcerated plaque in the distal ramus immediate vessel, which ultimately cleared with anticoagulation therapy.  Since her initial presentation, she has been free of recurrent anginal symptomatology.  She took aspirin and Brilinta 90 mg bid for 1 year and ultimately was switched to reduced dose Brilinta at 60 mg twice a day, which she has tolerated well.  In the past I had discussed the possibility of  switching to clopidogrel but she preferred to stay on Brilinta since she has been doing exceptionally well on this therapy.  I reviewed her most recent echo Doppler study from January 2023 which showed hyperdynamic LV function with EF 65 to 70%.  There were no wall motion abnormalities.  There was very minimal dilation of left atrium.  Valves were normal.  Over the past several years she has undergone successful left knee replacement surgery in 2022 and in September 2023 underwent hysterectomy without cardiovascular compromise.  Her blood pressure today is excellent on her regimen of amlodipine 5 mg, reduced dose HCTZ one half of the 12.5 mg pill, lisinopril 30 mg, and metoprolol tartrate 37.5 mg twice a day.  She has continued to be on rosuvastatin for aggressive lipid-lowering therapy.  LDL cholesterol in March 2023 was 48.  She is scheduled to see Drs. Carol Ada in March and will undergo repeat fasting laboratory.  She is on levothyroxine for hypothyroidism.  Clinically she is stable without chest pain or shortness of breath.  She will continue her current medical regimen.  I will see her in 1 year for reevaluation or sooner as needed.   Troy Sine, MD, [facility]  02/27/2022 12:16 PM

## 2022-02-27 ENCOUNTER — Encounter: Payer: Self-pay | Admitting: Cardiovascular Disease

## 2022-03-05 DIAGNOSIS — R058 Other specified cough: Secondary | ICD-10-CM | POA: Diagnosis not present

## 2022-03-18 ENCOUNTER — Other Ambulatory Visit: Payer: Self-pay

## 2022-03-18 MED ORDER — METOPROLOL TARTRATE 25 MG PO TABS: ORAL_TABLET | ORAL | 1 refills | Status: DC

## 2022-03-18 MED ORDER — TICAGRELOR 60 MG PO TABS: 60.0000 mg | ORAL_TABLET | Freq: Two times a day (BID) | ORAL | 1 refills | Status: DC

## 2022-03-20 DIAGNOSIS — R059 Cough, unspecified: Secondary | ICD-10-CM | POA: Diagnosis not present

## 2022-03-20 DIAGNOSIS — Z20822 Contact with and (suspected) exposure to covid-19: Secondary | ICD-10-CM | POA: Diagnosis not present

## 2022-03-21 DIAGNOSIS — R059 Cough, unspecified: Secondary | ICD-10-CM | POA: Diagnosis not present

## 2022-03-21 DIAGNOSIS — Z20822 Contact with and (suspected) exposure to covid-19: Secondary | ICD-10-CM | POA: Diagnosis not present

## 2022-03-22 DIAGNOSIS — R059 Cough, unspecified: Secondary | ICD-10-CM | POA: Diagnosis not present

## 2022-03-22 DIAGNOSIS — Z20822 Contact with and (suspected) exposure to covid-19: Secondary | ICD-10-CM | POA: Diagnosis not present

## 2022-03-25 DIAGNOSIS — R059 Cough, unspecified: Secondary | ICD-10-CM | POA: Diagnosis not present

## 2022-03-25 DIAGNOSIS — Z20822 Contact with and (suspected) exposure to covid-19: Secondary | ICD-10-CM | POA: Diagnosis not present

## 2022-03-26 ENCOUNTER — Telehealth: Payer: Self-pay | Admitting: Internal Medicine

## 2022-03-26 NOTE — Telephone Encounter (Signed)
Called patient regarding upcoming March appointment, left a voicemail.

## 2022-03-28 DIAGNOSIS — R059 Cough, unspecified: Secondary | ICD-10-CM | POA: Diagnosis not present

## 2022-03-28 DIAGNOSIS — Z20822 Contact with and (suspected) exposure to covid-19: Secondary | ICD-10-CM | POA: Diagnosis not present

## 2022-04-15 DIAGNOSIS — Z20822 Contact with and (suspected) exposure to covid-19: Secondary | ICD-10-CM | POA: Diagnosis not present

## 2022-04-15 DIAGNOSIS — R059 Cough, unspecified: Secondary | ICD-10-CM | POA: Diagnosis not present

## 2022-04-17 DIAGNOSIS — R059 Cough, unspecified: Secondary | ICD-10-CM | POA: Diagnosis not present

## 2022-04-17 DIAGNOSIS — Z20822 Contact with and (suspected) exposure to covid-19: Secondary | ICD-10-CM | POA: Diagnosis not present

## 2022-04-20 ENCOUNTER — Telehealth: Payer: Self-pay | Admitting: Internal Medicine

## 2022-04-20 NOTE — Telephone Encounter (Signed)
Rescheduled 03/20 appointment per 03/18 scheduling message. Patient has been rescheduled, patient is notified.

## 2022-04-22 ENCOUNTER — Inpatient Hospital Stay: Payer: Medicare HMO

## 2022-04-22 ENCOUNTER — Inpatient Hospital Stay: Payer: Medicare HMO | Admitting: Internal Medicine

## 2022-04-28 ENCOUNTER — Inpatient Hospital Stay: Payer: Medicare HMO | Attending: Nurse Practitioner

## 2022-04-28 ENCOUNTER — Inpatient Hospital Stay (HOSPITAL_BASED_OUTPATIENT_CLINIC_OR_DEPARTMENT_OTHER): Payer: Medicare HMO | Admitting: Internal Medicine

## 2022-04-28 VITALS — BP 138/58 | HR 60 | Temp 98.1°F | Resp 16 | Wt 155.2 lb

## 2022-04-28 DIAGNOSIS — D509 Iron deficiency anemia, unspecified: Secondary | ICD-10-CM | POA: Diagnosis not present

## 2022-04-28 DIAGNOSIS — D508 Other iron deficiency anemias: Secondary | ICD-10-CM

## 2022-04-28 DIAGNOSIS — D500 Iron deficiency anemia secondary to blood loss (chronic): Secondary | ICD-10-CM

## 2022-04-28 LAB — FERRITIN: Ferritin: 15 ng/mL (ref 11–307)

## 2022-04-28 LAB — CBC WITH DIFFERENTIAL (CANCER CENTER ONLY)
Abs Immature Granulocytes: 0.02 10*3/uL (ref 0.00–0.07)
Basophils Absolute: 0.1 10*3/uL (ref 0.0–0.1)
Basophils Relative: 1 %
Eosinophils Absolute: 0.5 10*3/uL (ref 0.0–0.5)
Eosinophils Relative: 7 %
HCT: 34.7 % — ABNORMAL LOW (ref 36.0–46.0)
Hemoglobin: 11.5 g/dL — ABNORMAL LOW (ref 12.0–15.0)
Immature Granulocytes: 0 %
Lymphocytes Relative: 20 %
Lymphs Abs: 1.4 10*3/uL (ref 0.7–4.0)
MCH: 30.2 pg (ref 26.0–34.0)
MCHC: 33.1 g/dL (ref 30.0–36.0)
MCV: 91.1 fL (ref 80.0–100.0)
Monocytes Absolute: 0.7 10*3/uL (ref 0.1–1.0)
Monocytes Relative: 10 %
Neutro Abs: 4.4 10*3/uL (ref 1.7–7.7)
Neutrophils Relative %: 62 %
Platelet Count: 299 10*3/uL (ref 150–400)
RBC: 3.81 MIL/uL — ABNORMAL LOW (ref 3.87–5.11)
RDW: 13.2 % (ref 11.5–15.5)
WBC Count: 7.2 10*3/uL (ref 4.0–10.5)
nRBC: 0 % (ref 0.0–0.2)

## 2022-04-28 LAB — IRON AND IRON BINDING CAPACITY (CC-WL,HP ONLY)
Iron: 64 ug/dL (ref 28–170)
Saturation Ratios: 18 % (ref 10.4–31.8)
TIBC: 354 ug/dL (ref 250–450)
UIBC: 290 ug/dL (ref 148–442)

## 2022-04-28 NOTE — Progress Notes (Signed)
[facility] [phone]   Fax:([phone]) [place], [place] [address]  DIAGNOSIS: Iron deficiency anemia suspicious for gastrointestinal blood loss  PRIOR THERAPY: Iron infusion with Venofer 300 Mg IV weekly for 3 weeks completed in early November 2023  CURRENT THERAPY: Oral iron tablets with vitamin C  INTERVAL HISTORY: Cathy Reilly 82 y.o. female returns to the clinic today for follow-up visit.  The patient is feeling fine today with no concerning complaints.  She denied having any chest pain, shortness of breath, cough or hemoptysis.  She denied having any nausea, vomiting, diarrhea or constipation.  She has no headache or visual changes.  She continues to tolerate her oral iron tablet with vitamin C fairly well.  The patient is here today for evaluation and repeat blood work.  MEDICAL HISTORY: Past Medical History:  Diagnosis Date   Anxiety    Bronchitis 12/2014   CAD (coronary artery disease), native coronary artery s/p DES to LAD 12/20/2014 01/03/2015   Hypertension    Hypothyroidism    NSTEMI (non-ST elevated myocardial infarction) ([facility]) 12/2014   Osteopenia    hip   Prediabetes Hgb A1C 6.0 01/03/2015   Thyroid disease    VF (ventricular fibrillation) during cath on 12/20/2014 from occluded mid LAD, s/p defib     ALLERGIES:  is allergic to codeine, crestor [rosuvastatin], lipitor [atorvastatin], septra [sulfamethoxazole-trimethoprim], simvastatin, welchol [colesevelam hcl], and prednisone.  MEDICATIONS:  Current Outpatient Medications  Medication Sig Dispense Refill   amLODipine (NORVASC) 5 MG tablet Take 1 tablet (5 mg total) by mouth daily. 90 tablet 3   amoxicillin-clavulanate (AUGMENTIN) 875-125 MG tablet Take 1 tablet by mouth every 12 (twelve) hours. 14 tablet 0   aspirin 81 MG tablet Take 81 mg by mouth daily.     ELDERBERRY PO Take 50 mg by mouth 2 (two)  times daily.     ferrous sulfate 325 (65 FE) MG tablet Take 1 tablet by mouth daily with breakfast.     hydrochlorothiazide (MICROZIDE) 12.5 MG capsule TAKE 1 CAPSULE (12.5 MG TOTAL) BY MOUTH DAILY. 90 capsule 3   levothyroxine (SYNTHROID, LEVOTHROID) 88 MCG tablet Take 88 mcg by mouth daily before breakfast.     lisinopril (ZESTRIL) 30 MG tablet Take 30 mg by mouth daily.      metoprolol tartrate (LOPRESSOR) 25 MG tablet TAKE 1 AND 1/2 TABLETS TWO TIMES DAILY 270 tablet 1   nitroGLYCERIN (NITROSTAT) 0.4 MG SL tablet Place 1 tablet (0.4 mg total) under the tongue every 5 (five) minutes x 3 doses as needed for chest pain. 25 tablet 1   potassium chloride SA (KLOR-CON M) 20 MEQ tablet Take 1 tablet (20 mEq total) by mouth 2 (two) times daily. 20 tablet 0   rosuvastatin (CRESTOR) 20 MG tablet Take 1 tablet (20 mg total) by mouth daily. 30 tablet 5   ticagrelor (BRILINTA) 60 MG TABS tablet Take 1 tablet (60 mg total) by mouth 2 (two) times daily. 180 tablet 1   vitamin B-12 (CYANOCOBALAMIN) 500 MCG tablet Take 500 mcg by mouth daily.     No current facility-administered medications for this visit.    SURGICAL HISTORY:  Past Surgical History:  Procedure Laterality Date   APPENDECTOMY     CARDIAC CATHETERIZATION N/A 12/20/2014   Procedure: Left Heart Cath and Coronary Angiography;  Surgeon: Troy Sine, MD;  Location: [facility];  Service:  Cardiovascular;  Laterality: N/A;   CARDIAC CATHETERIZATION N/A 12/20/2014   Procedure: Coronary Stent Intervention;  Surgeon: Troy Sine, MD;  Location: [facility];  Service: Cardiovascular;  Laterality: N/A;   CARDIAC CATHETERIZATION N/A 12/24/2014   Procedure: Left Heart Cath and Coronary Angiography;  Surgeon: Jettie Booze, MD;  Location: [facility];  Service: Cardiovascular;  Laterality: N/A;   CHOLECYSTECTOMY     CORONARY ANGIOPLASTY WITH STENT PLACEMENT     TONSILLECTOMY      REVIEW OF SYSTEMS:  A comprehensive  review of systems was negative.   PHYSICAL EXAMINATION: General appearance: alert, cooperative, and no distress Head: Normocephalic, without obvious abnormality, atraumatic Neck: no adenopathy, no JVD, supple, symmetrical, trachea midline, and thyroid not enlarged, symmetric, no tenderness/mass/nodules Lymph nodes: Cervical, supraclavicular, and axillary nodes normal. Resp: clear to auscultation bilaterally Back: symmetric, no curvature. ROM normal. No CVA tenderness. Cardio: regular rate and rhythm, S1, S2 normal, no murmur, click, rub or gallop GI: soft, non-tender; bowel sounds normal; no masses,  no organomegaly Extremities: extremities normal, atraumatic, no cyanosis or edema  ECOG PERFORMANCE STATUS: 0 - Asymptomatic  Blood pressure (!) 138/58, pulse 60, temperature 98.1 F (36.7 C), temperature source Oral, resp. rate 16, weight 155 lb 3.2 oz (70.4 kg), SpO2 99 %.  LABORATORY DATA: Lab Results  Component Value Date   WBC 7.2 04/28/2022   HGB 11.5 (L) 04/28/2022   HCT 34.7 (L) 04/28/2022   MCV 91.1 04/28/2022   PLT 299 04/28/2022      Chemistry      Component Value Date/Time   NA 136 11/19/2021 1325   NA 142 03/18/2018 0907   K 3.1 (L) 11/19/2021 1325   CL 100 11/19/2021 1325   CO2 29 11/19/2021 1325   BUN 12 11/19/2021 1325   BUN 11 03/18/2018 0907   CREATININE 0.63 11/19/2021 1325   CREATININE 0.85 11/18/2021 1311   CREATININE 0.61 04/20/2016 1039      Component Value Date/Time   CALCIUM 9.2 11/19/2021 1325   ALKPHOS 74 11/18/2021 1311   AST 18 11/18/2021 1311   ALT 11 11/18/2021 1311   BILITOT 1.0 11/18/2021 1311       RADIOGRAPHIC STUDIES: No results found.  ASSESSMENT AND PLAN: This is a very pleasant 82 years old white female with iron deficiency anemia likely secondary to gastrointestinal blood loss but the stool for Hemoccult was negative. She has extensive studies to evaluate the underlying etiology of her anemia that were unremarkable except for  the low serum ferritin and iron. She received iron infusion with Venofer 300 Mg IV weekly for 3 weeks and tolerated it fairly well. She is currently on oral ferrous sulfate with vitamin C and has been tolerating this treatment well. Repeat CBC today showed stable hemoglobin of 11.5 and hematocrit of 34.7%. Iron study and ferritin are still pending. I recommended for the patient to continue on the oral iron tablets for now. I will see her back for follow-up visit in 6 months for evaluation and repeat blood work. If the pending iron studies showed significant iron deficiency, I will arrange for her to receive iron infusion again. The patient was advised to call immediately if she has any other concerning symptoms in the interval. The patient voices understanding of current disease status and treatment options and is in agreement with the current care plan.  All questions were answered. The patient knows to call the clinic with any problems, questions or concerns. We can  certainly see the patient much sooner if necessary.  The total time spent in the appointment was 20 minutes.  Disclaimer: This note was dictated with voice recognition software. Similar sounding words can inadvertently be transcribed and may not be corrected upon review.

## 2022-05-11 DIAGNOSIS — E782 Mixed hyperlipidemia: Secondary | ICD-10-CM | POA: Diagnosis not present

## 2022-05-11 DIAGNOSIS — I10 Essential (primary) hypertension: Secondary | ICD-10-CM | POA: Diagnosis not present

## 2022-05-11 DIAGNOSIS — Z0000 Encounter for general adult medical examination without abnormal findings: Secondary | ICD-10-CM | POA: Diagnosis not present

## 2022-05-11 DIAGNOSIS — E039 Hypothyroidism, unspecified: Secondary | ICD-10-CM | POA: Diagnosis not present

## 2022-05-11 DIAGNOSIS — I2510 Atherosclerotic heart disease of native coronary artery without angina pectoris: Secondary | ICD-10-CM | POA: Diagnosis not present

## 2022-05-11 DIAGNOSIS — Z1331 Encounter for screening for depression: Secondary | ICD-10-CM | POA: Diagnosis not present

## 2022-05-11 DIAGNOSIS — M8580 Other specified disorders of bone density and structure, unspecified site: Secondary | ICD-10-CM | POA: Diagnosis not present

## 2022-05-11 LAB — LAB REPORT - SCANNED: EGFR: 59

## 2022-06-25 DIAGNOSIS — H401130 Primary open-angle glaucoma, bilateral, stage unspecified: Secondary | ICD-10-CM | POA: Diagnosis not present

## 2022-06-25 DIAGNOSIS — H2513 Age-related nuclear cataract, bilateral: Secondary | ICD-10-CM | POA: Diagnosis not present

## 2022-07-27 DIAGNOSIS — H2512 Age-related nuclear cataract, left eye: Secondary | ICD-10-CM | POA: Diagnosis not present

## 2022-07-27 DIAGNOSIS — H25043 Posterior subcapsular polar age-related cataract, bilateral: Secondary | ICD-10-CM | POA: Diagnosis not present

## 2022-07-27 DIAGNOSIS — H25013 Cortical age-related cataract, bilateral: Secondary | ICD-10-CM | POA: Diagnosis not present

## 2022-07-27 DIAGNOSIS — H2513 Age-related nuclear cataract, bilateral: Secondary | ICD-10-CM | POA: Diagnosis not present

## 2022-07-27 DIAGNOSIS — H401132 Primary open-angle glaucoma, bilateral, moderate stage: Secondary | ICD-10-CM | POA: Diagnosis not present

## 2022-09-17 ENCOUNTER — Other Ambulatory Visit: Payer: Self-pay | Admitting: Cardiovascular Disease

## 2022-10-08 DIAGNOSIS — H401122 Primary open-angle glaucoma, left eye, moderate stage: Secondary | ICD-10-CM | POA: Diagnosis not present

## 2022-10-08 DIAGNOSIS — H2513 Age-related nuclear cataract, bilateral: Secondary | ICD-10-CM | POA: Diagnosis not present

## 2022-10-08 DIAGNOSIS — H401121 Primary open-angle glaucoma, left eye, mild stage: Secondary | ICD-10-CM | POA: Diagnosis not present

## 2022-10-08 DIAGNOSIS — H2512 Age-related nuclear cataract, left eye: Secondary | ICD-10-CM | POA: Diagnosis not present

## 2022-10-09 DIAGNOSIS — H25011 Cortical age-related cataract, right eye: Secondary | ICD-10-CM | POA: Diagnosis not present

## 2022-10-09 DIAGNOSIS — H2511 Age-related nuclear cataract, right eye: Secondary | ICD-10-CM | POA: Diagnosis not present

## 2022-10-09 DIAGNOSIS — H25041 Posterior subcapsular polar age-related cataract, right eye: Secondary | ICD-10-CM | POA: Diagnosis not present

## 2022-10-16 ENCOUNTER — Telehealth: Payer: Self-pay | Admitting: Internal Medicine

## 2022-10-16 NOTE — Telephone Encounter (Signed)
Called patient regarding October appointments, patient is notified.

## 2022-10-21 ENCOUNTER — Other Ambulatory Visit: Payer: Self-pay | Admitting: Cardiovascular Disease

## 2022-10-27 ENCOUNTER — Other Ambulatory Visit: Payer: Medicare HMO

## 2022-10-27 ENCOUNTER — Ambulatory Visit: Payer: Medicare HMO | Admitting: Internal Medicine

## 2022-11-02 DIAGNOSIS — H35372 Puckering of macula, left eye: Secondary | ICD-10-CM | POA: Diagnosis not present

## 2022-11-05 ENCOUNTER — Ambulatory Visit: Payer: Medicare HMO | Admitting: Internal Medicine

## 2022-11-05 ENCOUNTER — Other Ambulatory Visit: Payer: Medicare HMO

## 2022-11-05 DIAGNOSIS — H401112 Primary open-angle glaucoma, right eye, moderate stage: Secondary | ICD-10-CM | POA: Diagnosis not present

## 2022-11-05 DIAGNOSIS — H2513 Age-related nuclear cataract, bilateral: Secondary | ICD-10-CM | POA: Diagnosis not present

## 2022-11-05 DIAGNOSIS — H2511 Age-related nuclear cataract, right eye: Secondary | ICD-10-CM | POA: Diagnosis not present

## 2022-11-05 DIAGNOSIS — H401111 Primary open-angle glaucoma, right eye, mild stage: Secondary | ICD-10-CM | POA: Diagnosis not present

## 2022-11-12 DIAGNOSIS — S52502A Unspecified fracture of the lower end of left radius, initial encounter for closed fracture: Secondary | ICD-10-CM | POA: Diagnosis not present

## 2022-11-12 DIAGNOSIS — S52612A Displaced fracture of left ulna styloid process, initial encounter for closed fracture: Secondary | ICD-10-CM | POA: Diagnosis not present

## 2022-11-12 DIAGNOSIS — Z96652 Presence of left artificial knee joint: Secondary | ICD-10-CM | POA: Diagnosis not present

## 2022-11-12 DIAGNOSIS — S52615A Nondisplaced fracture of left ulna styloid process, initial encounter for closed fracture: Secondary | ICD-10-CM | POA: Diagnosis not present

## 2022-11-12 DIAGNOSIS — W19XXXA Unspecified fall, initial encounter: Secondary | ICD-10-CM | POA: Diagnosis not present

## 2022-11-12 DIAGNOSIS — M1812 Unilateral primary osteoarthritis of first carpometacarpal joint, left hand: Secondary | ICD-10-CM | POA: Diagnosis not present

## 2022-11-12 DIAGNOSIS — M25562 Pain in left knee: Secondary | ICD-10-CM | POA: Diagnosis not present

## 2022-11-12 DIAGNOSIS — M25462 Effusion, left knee: Secondary | ICD-10-CM | POA: Diagnosis not present

## 2022-11-12 DIAGNOSIS — R9082 White matter disease, unspecified: Secondary | ICD-10-CM | POA: Diagnosis not present

## 2022-11-12 DIAGNOSIS — Z7902 Long term (current) use of antithrombotics/antiplatelets: Secondary | ICD-10-CM | POA: Diagnosis not present

## 2022-11-12 DIAGNOSIS — S8002XA Contusion of left knee, initial encounter: Secondary | ICD-10-CM | POA: Diagnosis not present

## 2022-11-12 DIAGNOSIS — M25532 Pain in left wrist: Secondary | ICD-10-CM | POA: Diagnosis not present

## 2022-11-12 DIAGNOSIS — Z043 Encounter for examination and observation following other accident: Secondary | ICD-10-CM | POA: Diagnosis not present

## 2022-11-17 ENCOUNTER — Telehealth: Payer: Self-pay | Admitting: Cardiovascular Disease

## 2022-11-17 ENCOUNTER — Inpatient Hospital Stay: Payer: Medicare HMO | Attending: Internal Medicine

## 2022-11-17 ENCOUNTER — Inpatient Hospital Stay: Payer: Medicare HMO | Admitting: Internal Medicine

## 2022-11-17 ENCOUNTER — Ambulatory Visit: Payer: Medicare HMO | Attending: Nurse Practitioner | Admitting: Nurse Practitioner

## 2022-11-17 ENCOUNTER — Encounter: Payer: Self-pay | Admitting: Nurse Practitioner

## 2022-11-17 DIAGNOSIS — Z01810 Encounter for preprocedural cardiovascular examination: Secondary | ICD-10-CM | POA: Diagnosis not present

## 2022-11-17 DIAGNOSIS — M654 Radial styloid tenosynovitis [de Quervain]: Secondary | ICD-10-CM | POA: Diagnosis not present

## 2022-11-17 DIAGNOSIS — M25532 Pain in left wrist: Secondary | ICD-10-CM | POA: Diagnosis not present

## 2022-11-17 NOTE — Telephone Encounter (Signed)
S/w the pt about her surgery with Dr. Luiz Blare. I explained to her that I just s/w the DR. Graves and his PAC Jim in regard to the need per pre op APP to hold Brilinta x 5 days. I assured the pt that ortho states can move surgery to Monday 11/23/22 with Dr. Luiz Blare. I informed the pt that we will need to set up a tele visit pre op appt today at 4 pm per pre op APP Eligha Bridegroom, NP. Pt is agreeable to plan of care. Med rec and consent are done.   Pt is also on ASA. I confirmed with Dr. Luiz Blare, PA Rosanne Ashing ; pt can remain on ASA, no need to stop ASA. I will update all parties involved.

## 2022-11-17 NOTE — Telephone Encounter (Signed)
Primary Cardiologist:Thomas Tresa Endo, MD   Preoperative team, please contact this patient and set up a phone call appointment for further preoperative risk assessment. Please obtain consent and complete medication review. Thank you for your help.   Per office protocol and pending no concerning cardiac symptoms, she may hold Brilinta  for 5 days days prior to procedure and should resume as soon as hemodynamically stable postoperatively.  I also confirmed the patient resides in the state of [place]. As per [facility] Medical Board telemedicine laws, the patient must reside in the state in which the provider is licensed.    Levi Aland, NP-C  11/17/2022, 12:06 PM [address] Office [phone] Fax [phone]

## 2022-11-17 NOTE — Telephone Encounter (Signed)
   Pre-operative Risk Assessment    Patient Name: Cathy Reilly  DOB: 07-May-1940 MRN: [id number]      Request for Surgical Clearance    Procedure:   Fixation of Left Wrist Fracture   Date of Surgery:  Clearance 11/18/22                                 Surgeon:  Dr. Luiz Blare  Surgeon's Group or Practice Name:  [facility]   Phone number:  [phone] Fax number:  [phone]   Type of Clearance Requested:   - Pharmacy:  Hold Ticagrelor (Brilinta) Need to know if there is a hold and for how long.    Type of Anesthesia:  General    Additional requests/questions:  Please advise surgeon/provider what medications should be held.  Signed, April Henson   11/17/2022, 11:00 AM

## 2022-11-17 NOTE — Progress Notes (Signed)
Virtual Visit via Telephone Note   Because of Cathy Reilly's co-morbid illnesses, she is at least at moderate risk for complications without adequate follow up.  This format is felt to be most appropriate for this patient at this time.  The patient did not have access to video technology/had technical difficulties with video requiring transitioning to audio format only (telephone).  All issues noted in this document were discussed and addressed.  No physical exam could be performed with this format.  Please refer to the patient's chart for her consent to telehealth for [facility].  Evaluation Performed:  Preoperative cardiovascular risk assessment _____________   Date:  11/17/2022   Patient ID:  Cathy Reilly, DOB 1940-08-27, MRN [id number] Patient Location:  Home Provider location:   Office  Primary Care Provider:  Merri Brunette, MD Primary Cardiologist:  Nicki Guadalajara, MD  Chief Complaint / Patient Profile   82 y.o. y/o female with a h/o CAD s/p NSTEMI with subsequent VT/VF arrest requiring defibrillation and stenting of subtotally occluded LAD in 2016, hypertension, hyperlipidemia, hypothyroidism who is pending fixation of left wrist fracture with Dr. Luiz Blare on 11/23/22 and presents today for telephonic preoperative cardiovascular risk assessment.  History of Present Illness    Cathy Reilly is a 82 y.o. female who presents via audio/video conferencing for a telehealth visit today.  Pt was last seen in cardiology clinic on 02/25/22 by Dr. Tresa Endo.  At that time Cathy Reilly was doing well.  The patient is now pending procedure as outlined above. Since her last visit, she denies chest pain, shortness of breath, lower extremity edema, fatigue, palpitations, melena, hematuria, hemoptysis, diaphoresis, weakness, presyncope, syncope, orthopnea, and PND. She unfortunately suffered a fall stepping into a hole but has otherwise remained active and is able to  achieve > 4 METS activity without concerning cardiac symptoms.   Past Medical History    Past Medical History:  Diagnosis Date   Anxiety    Bronchitis 12/2014   CAD (coronary artery disease), native coronary artery s/p DES to LAD 12/20/2014 01/03/2015   Hypertension    Hypothyroidism    NSTEMI (non-ST elevated myocardial infarction) ([facility]) 12/2014   Osteopenia    hip   Prediabetes Hgb A1C 6.0 01/03/2015   Thyroid disease    VF (ventricular fibrillation) during cath on 12/20/2014 from occluded mid LAD, s/p defib    Past Surgical History:  Procedure Laterality Date   APPENDECTOMY     CARDIAC CATHETERIZATION N/A 12/20/2014   Procedure: Left Heart Cath and Coronary Angiography;  Surgeon: Lennette Bihari, MD;  Location: [facility];  Service: Cardiovascular;  Laterality: N/A;   CARDIAC CATHETERIZATION N/A 12/20/2014   Procedure: Coronary Stent Intervention;  Surgeon: Lennette Bihari, MD;  Location: [facility];  Service: Cardiovascular;  Laterality: N/A;   CARDIAC CATHETERIZATION N/A 12/24/2014   Procedure: Left Heart Cath and Coronary Angiography;  Surgeon: Corky Crafts, MD;  Location: [facility];  Service: Cardiovascular;  Laterality: N/A;   CHOLECYSTECTOMY     CORONARY ANGIOPLASTY WITH STENT PLACEMENT     TONSILLECTOMY      Allergies  Allergies  Allergen Reactions   Codeine Nausea And Vomiting   Crestor [Rosuvastatin] Other (See Comments)    myaglia   Lipitor [Atorvastatin] Other (See Comments)    myaglia   Septra [Sulfamethoxazole-Trimethoprim] Nausea And Vomiting   Simvastatin Other (See Comments)    myaglia   Welchol [Colesevelam Hcl] Other (See Comments)  myaglia   Prednisone Palpitations    Home Medications    Prior to Admission medications   Medication Sig Start Date End Date Taking? Authorizing Provider  amLODipine (NORVASC) 5 MG tablet Take 1 tablet (5 mg total) by mouth daily. 03/26/20   Lennette Bihari, MD  amoxicillin-clavulanate  (AUGMENTIN) 875-125 MG tablet Take 1 tablet by mouth every 12 (twelve) hours. 02/16/22   Tomi Bamberger, PA-C  aspirin 81 MG tablet Take 81 mg by mouth daily.    [provider]  BRILINTA 60 MG TABS tablet TAKE 1 TABLET TWICE DAILY 09/17/22   Lennette Bihari, MD  ELDERBERRY PO Take 50 mg by mouth 2 (two) times daily. Patient not taking: Reported on 11/17/2022    [provider]  ferrous sulfate 325 (65 FE) MG tablet Take 1 tablet by mouth daily with breakfast.    [provider]  hydrochlorothiazide (MICROZIDE) 12.5 MG capsule TAKE 1 CAPSULE (12.5 MG TOTAL) BY MOUTH DAILY. 04/25/19   Lennette Bihari, MD  levothyroxine (SYNTHROID, LEVOTHROID) 88 MCG tablet Take 88 mcg by mouth daily before breakfast.    [provider]  lisinopril (ZESTRIL) 30 MG tablet Take 30 mg by mouth daily.  09/21/19   [provider]  metoprolol tartrate (LOPRESSOR) 25 MG tablet TAKE 1 AND 1/2 TABLETS TWICE DAILY 10/21/22   Lennette Bihari, MD  nitroGLYCERIN (NITROSTAT) 0.4 MG SL tablet Place 1 tablet (0.4 mg total) under the tongue every 5 (five) minutes x 3 doses as needed for chest pain. Patient not taking: Reported on 11/17/2022 08/29/19   Lennette Bihari, MD  potassium chloride SA (KLOR-CON M) 20 MEQ tablet Take 1 tablet (20 mEq total) by mouth 2 (two) times daily. Patient not taking: Reported on 11/17/2022 11/18/21   Heilingoetter, Cassandra L, PA-C  rosuvastatin (CRESTOR) 20 MG tablet Take 1 tablet (20 mg total) by mouth daily. 09/30/18   Lennette Bihari, MD  vitamin B-12 (CYANOCOBALAMIN) 500 MCG tablet Take 500 mcg by mouth daily.    [provider]    Physical Exam    Vital Signs:  Cathy Reilly does not have vital signs available for review today.  Given telephonic nature of communication, physical exam is limited. AAOx3. NAD. Normal affect.  Speech and respirations are unlabored.  Accessory Clinical Findings    None  Assessment & Plan    1.   Preoperative Cardiovascular Risk Assessment: According to the Revised Cardiac Risk Index (RCRI), her Perioperative Risk of Major Cardiac Event is (%): 0.9. Her Functional Capacity in METs is: 7.59 according to the Duke Activity Status Index (DASI). The patient is doing well from a cardiac perspective. Therefore, based on ACC/AHA guidelines, the patient would be at acceptable risk for the planned procedure without further cardiovascular testing. I will forward clearance to requesting provider.  The patient was advised that if she develops new symptoms prior to surgery to contact our office to arrange for a follow-up visit, and she verbalized understanding.  Per office protocol, he may hold Brilinta  for 5 days prior to procedure and should resume as soon as hemodynamically stable postoperatively.   A copy of this note will be routed to requesting surgeon.  Time:   Today, I have spent 10 minutes with the patient with telehealth technology discussing medical history, symptoms, and management plan.    Levi Aland, NP-C  11/17/2022, 3:55 PM [address] Office [phone] Fax [phone]

## 2022-11-23 DIAGNOSIS — S52572A Other intraarticular fracture of lower end of left radius, initial encounter for closed fracture: Secondary | ICD-10-CM | POA: Diagnosis not present

## 2022-11-23 DIAGNOSIS — Y999 Unspecified external cause status: Secondary | ICD-10-CM | POA: Diagnosis not present

## 2022-11-23 DIAGNOSIS — X58XXXA Exposure to other specified factors, initial encounter: Secondary | ICD-10-CM | POA: Diagnosis not present

## 2022-11-23 DIAGNOSIS — G5602 Carpal tunnel syndrome, left upper limb: Secondary | ICD-10-CM | POA: Diagnosis not present

## 2022-11-23 DIAGNOSIS — G8918 Other acute postprocedural pain: Secondary | ICD-10-CM | POA: Diagnosis not present

## 2022-12-01 DIAGNOSIS — M25532 Pain in left wrist: Secondary | ICD-10-CM | POA: Diagnosis not present

## 2022-12-02 DIAGNOSIS — S52502S Unspecified fracture of the lower end of left radius, sequela: Secondary | ICD-10-CM | POA: Diagnosis not present

## 2022-12-02 DIAGNOSIS — M25632 Stiffness of left wrist, not elsewhere classified: Secondary | ICD-10-CM | POA: Diagnosis not present

## 2022-12-03 DIAGNOSIS — Z9181 History of falling: Secondary | ICD-10-CM | POA: Diagnosis not present

## 2022-12-03 DIAGNOSIS — E039 Hypothyroidism, unspecified: Secondary | ICD-10-CM | POA: Diagnosis not present

## 2022-12-03 DIAGNOSIS — E782 Mixed hyperlipidemia: Secondary | ICD-10-CM | POA: Diagnosis not present

## 2022-12-03 DIAGNOSIS — I2510 Atherosclerotic heart disease of native coronary artery without angina pectoris: Secondary | ICD-10-CM | POA: Diagnosis not present

## 2022-12-03 DIAGNOSIS — I10 Essential (primary) hypertension: Secondary | ICD-10-CM | POA: Diagnosis not present

## 2022-12-03 DIAGNOSIS — R799 Abnormal finding of blood chemistry, unspecified: Secondary | ICD-10-CM | POA: Diagnosis not present

## 2022-12-04 DIAGNOSIS — H2513 Age-related nuclear cataract, bilateral: Secondary | ICD-10-CM | POA: Diagnosis not present

## 2022-12-07 DIAGNOSIS — H524 Presbyopia: Secondary | ICD-10-CM | POA: Diagnosis not present

## 2022-12-10 DIAGNOSIS — M25632 Stiffness of left wrist, not elsewhere classified: Secondary | ICD-10-CM | POA: Diagnosis not present

## 2022-12-10 DIAGNOSIS — S52502S Unspecified fracture of the lower end of left radius, sequela: Secondary | ICD-10-CM | POA: Diagnosis not present

## 2022-12-15 DIAGNOSIS — M25632 Stiffness of left wrist, not elsewhere classified: Secondary | ICD-10-CM | POA: Diagnosis not present

## 2022-12-16 DIAGNOSIS — S52502S Unspecified fracture of the lower end of left radius, sequela: Secondary | ICD-10-CM | POA: Diagnosis not present

## 2022-12-16 DIAGNOSIS — M25632 Stiffness of left wrist, not elsewhere classified: Secondary | ICD-10-CM | POA: Diagnosis not present

## 2022-12-18 ENCOUNTER — Telehealth: Payer: Self-pay | Admitting: Cardiovascular Disease

## 2022-12-18 MED ORDER — TICAGRELOR 60 MG PO TABS: 60.0000 mg | ORAL_TABLET | Freq: Two times a day (BID) | ORAL | 1 refills | Status: DC

## 2022-12-18 NOTE — Telephone Encounter (Signed)
*  STAT* If patient is at the pharmacy, call can be transferred to refill team.   1. Which medications need to be refilled? (please list name of each medication and dose if known) new prescription for Brilinta   2. Would you like to learn more about the convenience, safety, & potential cost savings by using the [facility]?     3. Are you open to using the [facility] (Type [facility]. .   4. Which pharmacy/location (including street and city if local pharmacy) is medication to be sent to?[facility] [address], [place]   5. Do they need a 30 day or 90 day supply? 30 days

## 2022-12-18 NOTE — Telephone Encounter (Signed)
Pt's medication was sent to pt's preferred pharmacy as requested. Confirmation received.

## 2022-12-18 NOTE — Telephone Encounter (Signed)
Patient calling the office for samples of medication:   1.  What medication and dosage are you requesting samples for? Brilinta  2.  Are you currently out of this medication?  Just a few

## 2022-12-18 NOTE — Telephone Encounter (Signed)
Spoke to patient and advised we did not have samples for Brililnta. I did mail out a 1 month free card for patient and sent her the website for her to apply for patient assistance through MyChart.

## 2022-12-23 DIAGNOSIS — S52502S Unspecified fracture of the lower end of left radius, sequela: Secondary | ICD-10-CM | POA: Diagnosis not present

## 2022-12-23 DIAGNOSIS — M25632 Stiffness of left wrist, not elsewhere classified: Secondary | ICD-10-CM | POA: Diagnosis not present

## 2023-01-06 DIAGNOSIS — S52502S Unspecified fracture of the lower end of left radius, sequela: Secondary | ICD-10-CM | POA: Diagnosis not present

## 2023-01-06 DIAGNOSIS — M25632 Stiffness of left wrist, not elsewhere classified: Secondary | ICD-10-CM | POA: Diagnosis not present

## 2023-01-14 DIAGNOSIS — M25632 Stiffness of left wrist, not elsewhere classified: Secondary | ICD-10-CM | POA: Diagnosis not present

## 2023-03-09 ENCOUNTER — Encounter (HOSPITAL_COMMUNITY): Payer: Self-pay

## 2023-03-09 ENCOUNTER — Emergency Department (HOSPITAL_COMMUNITY)
Admission: EM | Admit: 2023-03-09 | Discharge: 2023-03-09 | Discharge status: Against Medical Advice | Payer: Medicare HMO | Attending: Emergency Medicine | Admitting: Emergency Medicine

## 2023-03-09 ENCOUNTER — Other Ambulatory Visit: Payer: Self-pay

## 2023-03-09 DIAGNOSIS — Z5321 Procedure and treatment not carried out due to patient leaving prior to being seen by health care provider: Secondary | ICD-10-CM | POA: Diagnosis not present

## 2023-03-09 DIAGNOSIS — J101 Influenza due to other identified influenza virus with other respiratory manifestations: Secondary | ICD-10-CM | POA: Insufficient documentation

## 2023-03-09 DIAGNOSIS — Z20822 Contact with and (suspected) exposure to covid-19: Secondary | ICD-10-CM | POA: Insufficient documentation

## 2023-03-09 DIAGNOSIS — I959 Hypotension, unspecified: Secondary | ICD-10-CM | POA: Diagnosis not present

## 2023-03-09 DIAGNOSIS — R509 Fever, unspecified: Secondary | ICD-10-CM | POA: Diagnosis present

## 2023-03-09 DIAGNOSIS — R5383 Other fatigue: Secondary | ICD-10-CM | POA: Diagnosis not present

## 2023-03-09 DIAGNOSIS — R059 Cough, unspecified: Secondary | ICD-10-CM | POA: Diagnosis not present

## 2023-03-09 DIAGNOSIS — R197 Diarrhea, unspecified: Secondary | ICD-10-CM | POA: Diagnosis not present

## 2023-03-09 LAB — COMPREHENSIVE METABOLIC PANEL
ALT: 21 U/L (ref 0–44)
AST: 31 U/L (ref 15–41)
Albumin: 3.5 g/dL (ref 3.5–5.0)
Alkaline Phosphatase: 55 U/L (ref 38–126)
Anion gap: 11 (ref 5–15)
BUN: 14 mg/dL (ref 8–23)
CO2: 28 mmol/L (ref 22–32)
Calcium: 8.7 mg/dL — ABNORMAL LOW (ref 8.9–10.3)
Chloride: 94 mmol/L — ABNORMAL LOW (ref 98–111)
Creatinine, Ser: 0.72 mg/dL (ref 0.44–1.00)
GFR, Estimated: >60 mL/min (ref >60)
Glucose, Bld: 135 mg/dL — ABNORMAL HIGH (ref 70–99)
Potassium: 3.1 mmol/L — ABNORMAL LOW (ref 3.5–5.1)
Sodium: 133 mmol/L — ABNORMAL LOW (ref 135–145)
Total Bilirubin: 1.2 mg/dL (ref 0.0–1.2)
Total Protein: 7.1 g/dL (ref 6.5–8.1)

## 2023-03-09 LAB — CBC
HCT: 37.5 % (ref 36.0–46.0)
Hemoglobin: 12.4 g/dL (ref 12.0–15.0)
MCH: 29.5 pg (ref 26.0–34.0)
MCHC: 33.1 g/dL (ref 30.0–36.0)
MCV: 89.3 fL (ref 80.0–100.0)
Platelets: 186 10*3/uL (ref 150–400)
RBC: 4.2 MIL/uL (ref 3.87–5.11)
RDW: 13.2 % (ref 11.5–15.5)
WBC: 6.6 10*3/uL (ref 4.0–10.5)
nRBC: 0 % (ref 0.0–0.2)

## 2023-03-09 LAB — RESP PANEL BY RT-PCR (RSV, FLU A&B, COVID)  RVPGX2
Influenza A by PCR: POSITIVE — AB
Influenza B by PCR: NEGATIVE
Resp Syncytial Virus by PCR: NEGATIVE
SARS Coronavirus 2 by RT PCR: NEGATIVE

## 2023-03-09 LAB — LIPASE, BLOOD: Lipase: 31 U/L (ref 11–51)

## 2023-03-09 MED ORDER — ONDANSETRON 4 MG PO TBDP
4.0000 mg | ORAL_TABLET | Freq: Once | ORAL | Status: AC
  Administered 2023-03-09: 4 mg via ORAL
  Filled 2023-03-09: qty 1

## 2023-03-09 NOTE — ED Triage Notes (Signed)
Patient BIB EMS for nausea, diarrhea and weakness, fatigue, fevers, and cough x 1 weeks. Denies vomiting, chest pain, and SOB.

## 2023-03-09 NOTE — ED Provider Triage Note (Cosign Needed)
 Emergency Medicine Provider Triage Evaluation Note  Cathy Reilly , a 83 y.o. female  was evaluated in triage.  Pt complains of fever and diarrhea.  Patient reports babysitting her great grandson another week who tested positive for COVID.  She states that she was feeling cold symptoms a little bit before that but developed a fever of 102 F.  Diarrhea restarted today.  Endorses nausea but no vomiting.  Review of Systems  Positive: Fevers, diarrhea Negative: Vomiting   Physical Exam  BP (!) 138/59 (BP Location: Left Arm)   Pulse 73   Temp 99 F (37.2 C) (Oral)   Resp 17   Ht 5' (1.524 m)   Wt 68 kg   SpO2 95%   BMI 29.29 kg/m  Gen:   Awake, no distress   Resp:  Normal effort  MSK:   Moves extremities without difficulty  Other:    Medical Decision Making  Medically screening exam initiated at 8:44 AM.  Appropriate orders placed.  Dickey Leopoldo Stembridge was informed that the remainder of the evaluation will be completed by another provider, this initial triage assessment does not replace that evaluation, and the importance of remaining in the ED until their evaluation is complete.   Waddell Sluder, PA-C 03/09/23 [phone]

## 2023-03-12 ENCOUNTER — Emergency Department (HOSPITAL_BASED_OUTPATIENT_CLINIC_OR_DEPARTMENT_OTHER)
Admission: EM | Admit: 2023-03-12 | Discharge: 2023-03-12 | Disposition: A | Discharge status: Home / Self Care | Payer: Medicare HMO | Attending: Emergency Medicine | Admitting: Emergency Medicine

## 2023-03-12 ENCOUNTER — Emergency Department (HOSPITAL_BASED_OUTPATIENT_CLINIC_OR_DEPARTMENT_OTHER): Payer: Medicare HMO

## 2023-03-12 ENCOUNTER — Other Ambulatory Visit (HOSPITAL_BASED_OUTPATIENT_CLINIC_OR_DEPARTMENT_OTHER): Payer: Self-pay

## 2023-03-12 ENCOUNTER — Encounter (HOSPITAL_BASED_OUTPATIENT_CLINIC_OR_DEPARTMENT_OTHER): Payer: Self-pay | Admitting: Emergency Medicine

## 2023-03-12 ENCOUNTER — Other Ambulatory Visit: Payer: Self-pay

## 2023-03-12 DIAGNOSIS — I2510 Atherosclerotic heart disease of native coronary artery without angina pectoris: Secondary | ICD-10-CM | POA: Diagnosis not present

## 2023-03-12 DIAGNOSIS — Z7982 Long term (current) use of aspirin: Secondary | ICD-10-CM | POA: Insufficient documentation

## 2023-03-12 DIAGNOSIS — N390 Urinary tract infection, site not specified: Secondary | ICD-10-CM | POA: Diagnosis not present

## 2023-03-12 DIAGNOSIS — Z79899 Other long term (current) drug therapy: Secondary | ICD-10-CM | POA: Diagnosis not present

## 2023-03-12 DIAGNOSIS — B349 Viral infection, unspecified: Secondary | ICD-10-CM | POA: Diagnosis not present

## 2023-03-12 DIAGNOSIS — I10 Essential (primary) hypertension: Secondary | ICD-10-CM | POA: Insufficient documentation

## 2023-03-12 DIAGNOSIS — E039 Hypothyroidism, unspecified: Secondary | ICD-10-CM | POA: Insufficient documentation

## 2023-03-12 DIAGNOSIS — R059 Cough, unspecified: Secondary | ICD-10-CM | POA: Diagnosis not present

## 2023-03-12 DIAGNOSIS — R300 Dysuria: Secondary | ICD-10-CM | POA: Diagnosis present

## 2023-03-12 LAB — URINALYSIS, ROUTINE W REFLEX MICROSCOPIC
Bilirubin Urine: NEGATIVE
Glucose, UA: NEGATIVE mg/dL
Ketones, ur: NEGATIVE mg/dL
Nitrite: NEGATIVE
Protein, ur: NEGATIVE mg/dL
Specific Gravity, Urine: 1.01 (ref 1.005–1.030)
pH: 7 (ref 5.0–8.0)

## 2023-03-12 LAB — COMPREHENSIVE METABOLIC PANEL
ALT: 20 U/L (ref 0–44)
AST: 26 U/L (ref 15–41)
Albumin: 3.5 g/dL (ref 3.5–5.0)
Alkaline Phosphatase: 59 U/L (ref 38–126)
Anion gap: 11 (ref 5–15)
BUN: 11 mg/dL (ref 8–23)
CO2: 27 mmol/L (ref 22–32)
Calcium: 8.9 mg/dL (ref 8.9–10.3)
Chloride: 95 mmol/L — ABNORMAL LOW (ref 98–111)
Creatinine, Ser: 0.88 mg/dL (ref 0.44–1.00)
GFR, Estimated: >60 mL/min (ref >60)
Glucose, Bld: 133 mg/dL — ABNORMAL HIGH (ref 70–99)
Potassium: 3.1 mmol/L — ABNORMAL LOW (ref 3.5–5.1)
Sodium: 133 mmol/L — ABNORMAL LOW (ref 135–145)
Total Bilirubin: 1.3 mg/dL — ABNORMAL HIGH (ref 0.0–1.2)
Total Protein: 7.2 g/dL (ref 6.5–8.1)

## 2023-03-12 LAB — URINALYSIS, MICROSCOPIC (REFLEX)

## 2023-03-12 LAB — CBC
HCT: 35.3 % — ABNORMAL LOW (ref 36.0–46.0)
Hemoglobin: 11.9 g/dL — ABNORMAL LOW (ref 12.0–15.0)
MCH: 29.7 pg (ref 26.0–34.0)
MCHC: 33.7 g/dL (ref 30.0–36.0)
MCV: 88 fL (ref 80.0–100.0)
Platelets: 230 10*3/uL (ref 150–400)
RBC: 4.01 MIL/uL (ref 3.87–5.11)
RDW: 12.7 % (ref 11.5–15.5)
WBC: 8.1 10*3/uL (ref 4.0–10.5)
nRBC: 0 % (ref 0.0–0.2)

## 2023-03-12 MED ORDER — ONDANSETRON HCL 4 MG PO TABS
4.0000 mg | ORAL_TABLET | Freq: Three times a day (TID) | ORAL | 0 refills | Status: AC | PRN
  Filled 2023-03-12: qty 12, 4d supply, fill #0

## 2023-03-12 MED ORDER — POTASSIUM CHLORIDE CRYS ER 20 MEQ PO TBCR
40.0000 meq | EXTENDED_RELEASE_TABLET | Freq: Once | ORAL | Status: AC
  Administered 2023-03-12: 40 meq via ORAL
  Filled 2023-03-12: qty 2

## 2023-03-12 MED ORDER — CEPHALEXIN 500 MG PO CAPS
500.0000 mg | ORAL_CAPSULE | Freq: Four times a day (QID) | ORAL | 0 refills | Status: AC
  Filled 2023-03-12: qty 20, 5d supply, fill #0

## 2023-03-12 MED ORDER — SODIUM CHLORIDE 0.9 % IV SOLN
1.0000 g | Freq: Once | INTRAVENOUS | Status: AC
  Administered 2023-03-12: 1 g via INTRAVENOUS
  Filled 2023-03-12: qty 10

## 2023-03-12 MED ORDER — ONDANSETRON 4 MG PO TBDP
4.0000 mg | ORAL_TABLET | Freq: Once | ORAL | Status: AC
  Administered 2023-03-12: 4 mg via ORAL
  Filled 2023-03-12: qty 1

## 2023-03-12 NOTE — ED Notes (Signed)
 Pt ambulates to the bathroom to void.

## 2023-03-12 NOTE — ED Triage Notes (Signed)
 Pt dx with flu Tuesday-  reports bloody nasal drainage, poor po intake, cough, nausea, reports eating breakfast this AM.  Taking tylenol  sinus, minimal relief, last taken this AM.   Also reports burning with urination.

## 2023-03-12 NOTE — ED Provider Notes (Signed)
 [place] [facility] Provider Note   CSN: [id number] Arrival date & time: 03/12/23  1238     History  Chief Complaint  Patient presents with   URI   Dysuria    Cathy Reilly is a 83 y.o. female.  The history is provided by the patient and medical records. No language interpreter was used.  URI Dysuria    83 year old female history of CAD, hypothyroidism, hypertension, prediabetes, anxiety presents with flu symptoms.  Patient report for the past week she has had generalized fatigue, chills, headache, sinus congestion, sneezing, coughing, throat irritation, and occasional bouts of nausea.  She was seen 4 days ago for complaint and test positive for the flu.  For the past 3 days she also noted some burning on urination.  She does not endorse any blood in her urine and denies productive cough.  She denies any shortness of breath.  She overall does not feel well.  Home Medications Prior to Admission medications   Medication Sig Start Date End Date Taking? Authorizing Provider  amLODipine  (NORVASC ) 5 MG tablet Take 1 tablet (5 mg total) by mouth daily. 03/26/20   Burnard Debby LABOR, MD  amoxicillin -clavulanate (AUGMENTIN ) 875-125 MG tablet Take 1 tablet by mouth every 12 (twelve) hours. 02/16/22   Billy Asberry FALCON, PA-C  aspirin  81 MG tablet Take 81 mg by mouth daily.    [provider]  ELDERBERRY PO Take 50 mg by mouth 2 (two) times daily. Patient not taking: Reported on 11/17/2022    [provider]  ferrous sulfate 325 (65 FE) MG tablet Take 1 tablet by mouth daily with breakfast.    [provider]  hydrochlorothiazide  (MICROZIDE ) 12.5 MG capsule TAKE 1 CAPSULE (12.5 MG TOTAL) BY MOUTH DAILY. 04/25/19   Burnard Debby LABOR, MD  levothyroxine  (SYNTHROID , LEVOTHROID) 88 MCG tablet Take 88 mcg by mouth daily before breakfast.    [provider]  lisinopril  (ZESTRIL ) 30 MG tablet Take 30 mg by mouth daily.  09/21/19    [provider]  metoprolol  tartrate (LOPRESSOR ) 25 MG tablet TAKE 1 AND 1/2 TABLETS TWICE DAILY 10/21/22   Burnard Debby LABOR, MD  nitroGLYCERIN  (NITROSTAT ) 0.4 MG SL tablet Place 1 tablet (0.4 mg total) under the tongue every 5 (five) minutes x 3 doses as needed for chest pain. Patient not taking: Reported on 11/17/2022 08/29/19   Burnard Debby LABOR, MD  potassium chloride  SA (KLOR-CON  M) 20 MEQ tablet Take 1 tablet (20 mEq total) by mouth 2 (two) times daily. Patient not taking: Reported on 11/17/2022 11/18/21   Heilingoetter, Cassandra L, PA-C  rosuvastatin  (CRESTOR ) 20 MG tablet Take 1 tablet (20 mg total) by mouth daily. 09/30/18   Burnard Debby LABOR, MD  ticagrelor  (BRILINTA ) 60 MG TABS tablet Take 1 tablet (60 mg total) by mouth 2 (two) times daily. 12/18/22   Burnard Debby LABOR, MD  vitamin B-12 (CYANOCOBALAMIN ) 500 MCG tablet Take 500 mcg by mouth daily.    [provider]      Allergies    Codeine , Crestor  [rosuvastatin ], Lipitor [atorvastatin ], Septra [sulfamethoxazole-trimethoprim], Simvastatin, Welchol [colesevelam hcl], and Prednisone     Review of Systems   Review of Systems  Genitourinary:  Positive for dysuria.  All other systems reviewed and are negative.   Physical Exam Updated Vital Signs BP (!) 144/49 (BP Location: Left Arm)   Pulse (!) 59   Temp 97.8 F (36.6 C)   Resp 18   Ht 5' (1.524 m)  Wt 68 kg   SpO2 98%   BMI 29.29 kg/m  Physical Exam Vitals and nursing note reviewed.  Constitutional:      General: She is not in acute distress.    Appearance: She is well-developed.  HENT:     Head: Atraumatic.  Eyes:     Conjunctiva/sclera: Conjunctivae normal.  Cardiovascular:     Rate and Rhythm: Normal rate and regular rhythm.     Pulses: Normal pulses.     Heart sounds: Normal heart sounds.  Pulmonary:     Effort: Pulmonary effort is normal.     Breath sounds: Rhonchi present. No wheezing or rales.  Abdominal:     Palpations: Abdomen is soft.      Tenderness: There is no abdominal tenderness.  Musculoskeletal:     Cervical back: Neck supple.  Skin:    Findings: No rash.  Neurological:     Mental Status: She is alert.  Psychiatric:        Mood and Affect: Mood normal.     ED Results / Procedures / Treatments   Labs (all labs ordered are listed, but only abnormal results are displayed) Labs Reviewed  URINALYSIS, ROUTINE W REFLEX MICROSCOPIC - Abnormal; Notable for the following components:      Result Value   APPearance HAZY (*)    Hgb urine dipstick MODERATE (*)    Leukocytes,Ua MODERATE (*)    All other components within normal limits  COMPREHENSIVE METABOLIC PANEL - Abnormal; Notable for the following components:   Sodium 133 (*)    Potassium 3.1 (*)    Chloride 95 (*)    Glucose, Bld 133 (*)    Total Bilirubin 1.3 (*)    All other components within normal limits  CBC - Abnormal; Notable for the following components:   Hemoglobin 11.9 (*)    HCT 35.3 (*)    All other components within normal limits  URINALYSIS, MICROSCOPIC (REFLEX) - Abnormal; Notable for the following components:   Bacteria, UA FEW (*)    All other components within normal limits    EKG None  Radiology DG Chest 2 View Result Date: 03/12/2023 CLINICAL DATA:  Cough. EXAM: CHEST - 2 VIEW COMPARISON:  11/19/2021. FINDINGS: Bilateral lungs appear hyperexpanded and hyperlucent with coarse bronchovascular markings, concerning for underlying COPD. Bilateral lungs otherwise appear clear. No dense consolidation or lung collapse. Bilateral costophrenic angles are clear. Stable cardio-mediastinal silhouette. No acute osseous abnormalities. The soft tissues are within normal limits. There are surgical clips in the right upper quadrant, typical of a previous cholecystectomy. IMPRESSION: No active cardiopulmonary disease. Finding concerning for underlying COPD. Electronically Signed   By: Ree Molt M.D.   On: 03/12/2023 15:29    Procedures Procedures     Medications Ordered in ED Medications  ondansetron  (ZOFRAN -ODT) disintegrating tablet 4 mg (4 mg Oral Given 03/12/23 1316)  potassium chloride  SA (KLOR-CON  M) CR tablet 40 mEq (40 mEq Oral Given 03/12/23 1430)  cefTRIAXone  (ROCEPHIN ) 1 g in sodium chloride  0.9 % 100 mL IVPB (0 g Intravenous Stopped 03/12/23 1557)    ED Course/ Medical Decision Making/ A&P                                 Medical Decision Making Amount and/or Complexity of Data Reviewed Labs: ordered. Radiology: ordered.  Risk Prescription drug management.   BP (!) 144/49 (BP Location: Left Arm)   Pulse (!) 59  Temp 97.8 F (36.6 C)   Resp 18   Ht 5' (1.524 m)   Wt 68 kg   SpO2 98%   BMI 29.29 kg/m   63:49 PM 83 year old female history of CAD, hypothyroidism, hypertension, prediabetes, anxiety presents with flu symptoms.  Patient report for the past week she has had generalized fatigue, chills, headache, sinus congestion, sneezing, coughing, throat irritation, and occasional bouts of nausea.  She was seen 4 days ago for complaint and test positive for the flu.  For the past 3 days she also noted some burning on urination.  She does not endorse any blood in her urine and denies productive cough.  She denies any shortness of breath.  She overall does not feel well.  On exam patient is resting comfortably appears to be in no acute discomfort.  She does have some rhonchi heard on her lung exam but no wheezes.  Abdomen is soft nontender.  No CVA tenderness.  Strength is equal throughout.  -Labs ordered, independently viewed and interpreted by me.  Labs remarkable for K+ 3.1, supplementation given.  UA consistent with UTI, will treat with rocephin  -The patient was maintained on a cardiac monitor.  I personally viewed and interpreted the cardiac monitored which showed an underlying rhythm of: NSR -Imaging independently viewed and interpreted by me and I agree with radiologist's interpretation.  Result remarkable for CXR  without acute changes -This patient presents to the ED for concern of flu sxs, this involves an extensive number of treatment options, and is a complaint that carries with it a high risk of complications and morbidity.  The differential diagnosis includes flu, covid, rsv, pneumonia, viral illness, UTI -Co morbidities that complicate the patient evaluation includes CAD, thyroid  disease, HTN, anxiety -Treatment includes rocephin  -Reevaluation of the patient after these medicines showed that the patient improved -PCP office notes or outside notes reviewed -Discussion with attending Dr. Franklyn -Escalation to admission/observation considered: patients feels much better, is comfortable with discharge, and will follow up with PCP -Prescription medication considered, patient comfortable with keflex  -Social Determinant of Health considered         Final Clinical Impression(s) / ED Diagnoses Final diagnoses:  Lower urinary tract infectious disease  Viral illness    Rx / DC Orders ED Discharge Orders          Ordered    cephALEXin  (KEFLEX ) 500 MG capsule  4 times daily        03/12/23 1641    ondansetron  (ZOFRAN ) 4 MG tablet  Every 8 hours PRN        03/12/23 1641              Nivia Colon, PA-C 03/12/23 1642    Franklyn Sid SAILOR, MD 03/13/23 [phone]

## 2023-03-12 NOTE — Discharge Instructions (Signed)
 You have been evaluated for your symptoms.  Your urine shows signs of urinary tract infection.  Please take antibiotics as prescribed.  Follow-up closely with your doctor for further care.

## 2023-03-25 ENCOUNTER — Ambulatory Visit: Payer: Medicare HMO | Admitting: Cardiovascular Disease

## 2023-03-26 ENCOUNTER — Ambulatory Visit: Payer: Medicare HMO | Admitting: Cardiovascular Disease

## 2023-04-29 DIAGNOSIS — M25561 Pain in right knee: Secondary | ICD-10-CM | POA: Diagnosis not present

## 2023-05-06 DIAGNOSIS — N393 Stress incontinence (female) (male): Secondary | ICD-10-CM | POA: Diagnosis not present

## 2023-05-06 DIAGNOSIS — N812 Incomplete uterovaginal prolapse: Secondary | ICD-10-CM | POA: Diagnosis not present

## 2023-06-08 DIAGNOSIS — E039 Hypothyroidism, unspecified: Secondary | ICD-10-CM | POA: Diagnosis not present

## 2023-06-08 DIAGNOSIS — I2510 Atherosclerotic heart disease of native coronary artery without angina pectoris: Secondary | ICD-10-CM | POA: Diagnosis not present

## 2023-06-08 DIAGNOSIS — I10 Essential (primary) hypertension: Secondary | ICD-10-CM | POA: Diagnosis not present

## 2023-06-08 DIAGNOSIS — Z0000 Encounter for general adult medical examination without abnormal findings: Secondary | ICD-10-CM | POA: Diagnosis not present

## 2023-06-08 DIAGNOSIS — E782 Mixed hyperlipidemia: Secondary | ICD-10-CM | POA: Diagnosis not present

## 2023-06-08 DIAGNOSIS — Z23 Encounter for immunization: Secondary | ICD-10-CM | POA: Diagnosis not present

## 2023-06-08 DIAGNOSIS — E611 Iron deficiency: Secondary | ICD-10-CM | POA: Diagnosis not present

## 2023-06-08 DIAGNOSIS — Z1331 Encounter for screening for depression: Secondary | ICD-10-CM | POA: Diagnosis not present

## 2023-06-22 ENCOUNTER — Ambulatory Visit: Payer: Medicare HMO | Attending: Cardiovascular Disease | Admitting: Cardiovascular Disease

## 2023-06-22 ENCOUNTER — Encounter: Payer: Self-pay | Admitting: Cardiovascular Disease

## 2023-06-22 DIAGNOSIS — E039 Hypothyroidism, unspecified: Secondary | ICD-10-CM

## 2023-06-22 DIAGNOSIS — Z9861 Coronary angioplasty status: Secondary | ICD-10-CM | POA: Diagnosis not present

## 2023-06-22 DIAGNOSIS — I214 Non-ST elevation (NSTEMI) myocardial infarction: Secondary | ICD-10-CM | POA: Diagnosis not present

## 2023-06-22 DIAGNOSIS — I10 Essential (primary) hypertension: Secondary | ICD-10-CM | POA: Diagnosis not present

## 2023-06-22 DIAGNOSIS — E785 Hyperlipidemia, unspecified: Secondary | ICD-10-CM | POA: Diagnosis not present

## 2023-06-22 DIAGNOSIS — I2510 Atherosclerotic heart disease of native coronary artery without angina pectoris: Secondary | ICD-10-CM

## 2023-06-22 NOTE — Progress Notes (Signed)
 Patient ID: Cathy Reilly, female   DOB: 1940/05/09, 83 y.o.   MRN: [id number]      PCP: Dr. Faustina Hood  HPI: Cathy Reilly is a 83 y.o. female who presents to the office today for a 16 month follow up cardiology evaluation.   Cathy Reilly has a history of hypertension and hypothyroidism.  She was admitted in the early morning on 12/19/2014 with a symptom complex suggestive of unstable angina.  She ruled in for non-ST segment elevation MI with initial troponin of 5.  She was referred for cardiac catheterization.  In the catheterization laboratory she developed an episode of VT/VF and required defibrillation.  She underwent successful intervention to a subtotally/totally occluded LAD which had diffuse proximal to mid disease and was successfully stented with a 3033 mm Xience Alpine stent.  She was also felt to have a possible spontaneous dissection with a 90% distal intermediate stenosis ulcerated plaque.  She was maintained on anticoagulation and several days later was brought back to the catheterization laboratory.  Her LAD stent was widely patent and there was resolution of her prior intermediate stenosis.  Subsequent, she has remained stable.  She is participating in phase II cardiac rehabilitation.  She denies recurrent anginal symptoms.  She admits to being active.  She is unaware of any palpitations.  When I saw her in October 2017 she had been on aspirin  81 mg and Brilinta  90 twice a day for dual antiplatelet therapy. I discussed the Pegasus trial data and ultimately switched her to 60 mg, Brilinta  twice a day, which she has been taking and tolerating.  When I saw her in November 2018 she was stable and denied any episodes of chest pain or palpitations.  She stays active taking care of her 9 grandchildren and has 5 great-grandchildren.  She has been on amlodipine  5 mg, lisinopril  20 mg, metoprolol  37.5 mg twice a day for hypertension and post MI regimen.  She was on aspirin  and low-dose  Brilinta  at 60 mg twice a day for dual antiplatelet therapy.  She has been on Crestor  mg daily for hyperlipidemia with target LDL less than 70.  She states her blood pressure typically at home ranges in the 130 to 160 range.    When seen in May 2019  she denied any recurrent anginal symptomatology.   Her blood pressure at home was running in the 140 - 150 range.    Cholesterol was 118 LDL 59.  She was on amlodipine  7.5 mg , metoprolol  37.5 mg twice a day and lisinopril  20 mg for hypertension.  She has hypothyroidism on levothyroxine  88 mcg.  Not had any bleeding on aspirin  and Brilinta .  I discussed with her new hypertensive guidelines and recommended further titration of amlodipine  up to 10 mg daily.  I saw her in February 2020.  She had developed swelling on the increased amlodipine  dose and this had been reduced back down to 5 mg with HCTZ 12.5 mg being added to her regimen.  Edema had resolved.  I saw her in September 2020 at which time she continued to feel well. Her blood pressure most of the time typically is over 130.  She denied chest pain or shortness of breath or palpitations.  She was taking amlodipine  5 mg, HCTZ 12.5 mg, lisinopril  30 mg and metoprolol  37.5 mg twice a day for blood pressure control.  During that evaluation, I recommended slight titration of lisinopril  from 20 mg up to 30 mg daily.  I saw  her on December 25, 2019 at which time she felt well.  Her blood pressure had improved with the increase in her lisinopril  dose.   Approximately 3 months ago she did experience an episode of midepigastric discomfort which she felt most likely was due to gas.  She denies any exertional chest pain symptomatology.  She denies palpitations.  She continues to be on aspirin  and low-dose Brilinta  following her VT VF arrest.  She is on amlodipine  5 mg, HCTZ 12.5 mg, lisinopril  30 mg in addition to metoprolol  tartrate 37.5 mg twice a day.  She continues to be on rosuvastatin  20 mg daily for  hyperlipidemia.  In February 2021 LDL cholesterol was 61.  I saw her on January 16, 2021. She denied hest pain or shortness of breathShe was to see Dr. Faustina Hood who will be checking her laboratory in January 2023.  She had undergone knee surgery with initial left torn meniscus and ultimate knee replacement surgery.  She tolerated surgery well.  She denies chest pain or shortness of breath.  She continues to be on amlodipine  5 mg, HCTZ 12.5 mg, lisinopril  30 mg daily and metoprolol  tartrate 37.5 mg twice a day.  She is on levothyroxine  for hypothyroidism.  She continues to be on rosuvastatin  20 mg daily.  During that evaluation I recommended she undergo follow-up echo Doppler study.  On February 13, 2021, a follow-up echo demonstrated hyperdynamic LV function with EF 65 to 70%.  There was mild LA dilation.  Valves were not normal.  She had a telephone virtual visit by Cathy Dears, Cathy Reilly in September 2023 for preoperative cardiovascular risk assessment prior to undergoing hysterectomy.  I last saw her on February 25, 2022. She had undergone left knee replacement surgery in 2022 and she tolerated her September 2023 hysterectomy without cardiovascular compromise.  She had to be away from her house for 4 months for repair issues but has been back living at home in December.  Presently she denies chest pain or shortness of breath.  She continues to see Dr. Faustina Hood for primary care who will be rechecking her laboratory.  Most recent laboratory in October 2023 showed creatinine 0.87.  Lipid studies in March 2023 showed LDL cholesterol at 48.  She continues to be on rosuvastatin  20 mg for lipid control.  Her blood pressure has been stable on amlodipine  5 mg, HCTZ one half of a 12.5 mg pill, lisinopril  30 mg and metoprolol  tartrate 37.5 mg daily.  She continues to be on DAPT with aspirin  and reduced dose of Brilinta  60 mg twice a day.  She is on levothyroxine  88 mcg for hypothyroidism.   Since I last saw  her, she has continued to do well.  She sees Dr. Felipe Horton at [facility] 2 times per year.  She required left wrist surgery in October 2024 and tolerated this well from a cardiovascular standpoint.  Presently, she feels well and she continues to be on amlodipine  5 mg, hydrochlorothiazide  12.5 mg, lisinopril  30 mg and metoprolol  tartrate 37.5 mg daily.  She is on rosuvastatin  20 mg for hyperlipidemia.  She has been taking aspirin  81 mg and reduced dose Brilinta  at 60 mg twice a day with her cardiac intervention history.  She is on levothyroxine  88 mcg.  She presents for evaluation.      Past Medical History:  Diagnosis Date   Anxiety    Bronchitis 12/2014   CAD (coronary artery disease), native coronary artery s/p DES to LAD 12/20/2014 01/03/2015   Hypertension  Hypothyroidism    NSTEMI (non-ST elevated myocardial infarction) (HCC) 12/2014   Osteopenia    hip   Prediabetes Hgb A1C 6.0 01/03/2015   Thyroid  disease    VF (ventricular fibrillation) during cath on 12/20/2014 from occluded mid LAD, s/p defib     Past Surgical History:  Procedure Laterality Date   APPENDECTOMY     CARDIAC CATHETERIZATION N/A 12/20/2014   Procedure: Left Heart Cath and Coronary Angiography;  Surgeon: Millicent Ally, MD;  Location: [facility] INVASIVE CV LAB;  Service: Cardiovascular;  Laterality: N/A;   CARDIAC CATHETERIZATION N/A 12/20/2014   Procedure: Coronary Stent Intervention;  Surgeon: Millicent Ally, MD;  Location: [facility] INVASIVE CV LAB;  Service: Cardiovascular;  Laterality: N/A;   CARDIAC CATHETERIZATION N/A 12/24/2014   Procedure: Left Heart Cath and Coronary Angiography;  Surgeon: Lucendia Rusk, MD;  Location: [facility] INVASIVE CV LAB;  Service: Cardiovascular;  Laterality: N/A;   CHOLECYSTECTOMY     CORONARY ANGIOPLASTY WITH STENT PLACEMENT     TONSILLECTOMY      Allergies  Allergen Reactions   Codeine  Nausea And Vomiting   Crestor  [Rosuvastatin ] Other (See Comments)    myaglia   Lipitor [Atorvastatin ] Other  (See Comments)    myaglia   Septra [Sulfamethoxazole-Trimethoprim] Nausea And Vomiting   Simvastatin Other (See Comments)    myaglia   Welchol [Colesevelam Hcl] Other (See Comments)    myaglia   Prednisone  Palpitations    Current Outpatient Medications  Medication Sig Dispense Refill   amLODipine  (NORVASC ) 5 MG tablet Take 1 tablet (5 mg total) by mouth daily. 90 tablet 3   amoxicillin -clavulanate (AUGMENTIN ) 875-125 MG tablet Take 1 tablet by mouth every 12 (twelve) hours. 14 tablet 0   cephALEXin  (KEFLEX ) 500 MG capsule Take 1 capsule (500 mg total) by mouth 4 (four) times daily. 20 capsule 0   ferrous sulfate 325 (65 FE) MG tablet Take 1 tablet by mouth daily with breakfast.     hydrochlorothiazide  (MICROZIDE ) 12.5 MG capsule TAKE 1 CAPSULE (12.5 MG TOTAL) BY MOUTH DAILY. 90 capsule 3   latanoprost (XALATAN) 0.005 % ophthalmic solution Place 1 drop into both eyes at bedtime.     levothyroxine  (SYNTHROID , LEVOTHROID) 88 MCG tablet Take 88 mcg by mouth daily before breakfast.     lisinopril  (ZESTRIL ) 30 MG tablet Take 30 mg by mouth daily.      metoprolol  tartrate (LOPRESSOR ) 25 MG tablet TAKE 1 AND 1/2 TABLETS TWICE DAILY 135 tablet 1   nitroGLYCERIN  (NITROSTAT ) 0.4 MG SL tablet Place 1 tablet (0.4 mg total) under the tongue every 5 (five) minutes x 3 doses as needed for chest pain. 25 tablet 1   ondansetron  (ZOFRAN ) 4 MG tablet Take 1 tablet (4 mg total) by mouth every 8 (eight) hours as needed for nausea or vomiting. 12 tablet 0   rosuvastatin  (CRESTOR ) 20 MG tablet Take 1 tablet (20 mg total) by mouth daily. 30 tablet 5   ticagrelor  (BRILINTA ) 60 MG TABS tablet Take 1 tablet (60 mg total) by mouth 2 (two) times daily. 180 tablet 1   vitamin B-12 (CYANOCOBALAMIN ) 500 MCG tablet Take 500 mcg by mouth daily.     ELDERBERRY PO Take 50 mg by mouth 2 (two) times daily. (Patient not taking: Reported on 11/17/2022)     No current facility-administered medications for this visit.    Social  History   Socioeconomic History   Marital status: Married    Spouse name: Not on file   Number of children:  Not on file   Years of education: Not on file   Highest education level: Not on file  Occupational History   Not on file  Tobacco Use   Smoking status: Never   Smokeless tobacco: Never  Vaping Use   Vaping status: Never Used  Substance and Sexual Activity   Alcohol use: No    Alcohol/week: 0.0 standard drinks of alcohol   Drug use: No   Sexual activity: Not on file  Other Topics Concern   Not on file  Social History Narrative   Not on file   Social Drivers of Health   Financial Resource Strain: Not on file  Food Insecurity: Low Risk  (05/06/2023)   Received from [facility]   Hunger Vital Sign    Worried About Running Out of Food in the Last Year: Never true    Ran Out of Food in the Last Year: Never true  Transportation Needs: No Transportation Needs (05/06/2023)   Received from [facility]    In the past 12 months, has lack of reliable transportation kept you from medical appointments, meetings, work or from getting things needed for daily living? : No  Physical Activity: Not on file  Stress: Not on file  Social Connections: Not on file  Intimate Partner Violence: Not on file    Family History  Problem Relation Age of Onset   Hypertension Mother    Hypertension Father    Hypertension Sister    Cancer Sister    Hypertension Brother    Cancer Brother    CVA Brother    Transient ischemic attack Sister     ROS General: Negative; No fevers, chills, or night sweats HEENT: Negative; No changes in vision or hearing, sinus congestion, difficulty swallowing Pulmonary: Negative; No cough, wheezing, shortness of breath, hemoptysis Cardiovascular: See HPI: No chest pain, presyncope, syncope, palpatations GI: Negative; No nausea, vomiting, diarrhea, or abdominal pain GU: Status post hysterectomy Musculoskeletal: Repair of left torn meniscus and  then subsequent knee replacement; left wrist surgery Hematologic: Negative; no easy bruising, bleeding Endocrine: Negative; no heat/cold intolerance; no diabetes, Neuro: Negative; no changes in balance, headaches Skin: Negative; No rashes or skin lesions Psychiatric: Negative; No behavioral problems, depression Sleep: Negative; No snoring,  daytime sleepiness, hypersomnolence, bruxism, restless legs, hypnogognic hallucinations. Other comprehensive 14 point system review is negative   Physical Exam BP 132/84   Pulse (!) 58   Ht 5' (1.524 m)   Wt 154 lb 9.6 oz (70.1 kg)   SpO2 96%   BMI 30.19 kg/m    Repeat blood pressure by me 136/74  Wt Readings from Last 3 Encounters:  06/22/23 154 lb 9.6 oz (70.1 kg)  03/12/23 150 lb (68 kg)  03/09/23 150 lb (68 kg)   General: Alert, oriented, no distress.  Skin: normal turgor, no rashes, warm and dry HEENT: Normocephalic, atraumatic. Pupils equal round and reactive to light; sclera anicteric; extraocular muscles intact;  Nose without nasal septal hypertrophy Mouth/Parynx benign; Mallinpatti scale 3 Neck: No JVD, no carotid bruits; normal carotid upstroke Lungs: clear to ausculatation and percussion; no wheezing or rales Chest wall: without tenderness to palpitation Heart: PMI not displaced, RRR, s1 s2 normal, 1/6 systolic murmur, no diastolic murmur, no rubs, gallops, thrills, or heaves Abdomen: soft, nontender; no hepatosplenomehaly, BS+; abdominal aorta nontender and not dilated by palpation. Back: no CVA tenderness Pulses 2+ Musculoskeletal: full range of motion, normal strength, no joint deformities Extremities: no clubbing cyanosis or edema, Homan's  sign negative  Neurologic: grossly nonfocal; Cranial nerves grossly wnl Psychologic: Normal mood and affect   EKG Interpretation Date/Time:  Tuesday Jun 22 2023 13:53:01 EDT Ventricular Rate:  58 PR Interval:  134 QRS Duration:  96 QT Interval:  440 QTC Calculation: 431 R  Axis:   73  Text Interpretation: Sinus bradycardia with sinus arrhythmia When compared with ECG of 29-Aug-2019 14:14, No significant change was found Confirmed by Magnus Schuller ([id number]) on 06/22/2023 2:36:19 PM    February 25, 2022 ECG (independently read by me): Sinus bradycardia at 59, small nondiagnotic Q  waves inferiorly   January 16, 2021 ECG (independently read by me): Sinus bradycardia at 55; no ectopy, normal intervals  September 2020 ECG (independently read by me): Normal sinus rhythm at 62 bpm.  No ectopy.  QTc interval 472 ms.  February 2020 ECG (independently read by me): Normal sinus rhythm at 61 bpm.  No ectopy.  Normal intervals.  May 2019 ECG (independently read by me): Normal sinus rhythm at 65 bpm.  No ectopy.  Normal intervals.  November 2018 ECG (independently read by me): Sinus bradycardia 57 bpm.  Normal intervals.  No ectopy.  No ST segment changes.  March 2018 ECG (independently read by me): Normal sinus rhythm at 60 bpm.  Preserved R waves inferiorly with small nondiagnostic Q waves in the 3  11/26/2015 ECG (independently read by me): Sinus bradycardia 59 bpm.  No ECG evidence of her prior MI.  Preserved R waves inferiorly with small Q wave in 3.  ECG (independently read by me):  Sinus bradycardia 59 bpm.  No ectopy.  No ECG criteria for her MI.  LABS:     Latest Ref Rng & Units 03/12/2023    1:13 PM 03/09/2023    8:36 AM 11/19/2021    1:25 PM  BMP  Glucose 70 - 99 mg/dL 604  540  981   BUN 8 - 23 mg/dL 11  14  12    Creatinine 0.44 - 1.00 mg/dL 1.91  4.78  2.95   Sodium 135 - 145 mmol/L 133  133  136   Potassium 3.5 - 5.1 mmol/L 3.1  3.1  3.1   Chloride 98 - 111 mmol/L 95  94  100   CO2 22 - 32 mmol/L 27  28  29    Calcium  8.9 - 10.3 mg/dL 8.9  8.7  9.2         Latest Ref Rng & Units 03/12/2023    1:13 PM 03/09/2023    8:36 AM 11/18/2021    1:11 PM  Hepatic Function  Total Protein 6.5 - 8.1 g/dL 7.2  7.1  7.3   Albumin 3.5 - 5.0 g/dL 3.5  3.5  4.2   AST  15 - 41 U/L 26  31  18    ALT 0 - 44 U/L 20  21  11    Alk Phosphatase 38 - 126 U/L 59  55  74   Total Bilirubin 0.0 - 1.2 mg/dL 1.3  1.2  1.0        Latest Ref Rng & Units 03/12/2023    1:13 PM 03/09/2023    8:36 AM 04/28/2022    7:48 AM  CBC  WBC 4.0 - 10.5 K/uL 8.1  6.6  7.2   Hemoglobin 12.0 - 15.0 g/dL 62.1  30.8  65.7   Hematocrit 36.0 - 46.0 % 35.3  37.5  34.7   Platelets 150 - 400 K/uL 230  186  299  Lab Results  Component Value Date   MCV 88.0 03/12/2023   MCV 89.3 03/09/2023   MCV 91.1 04/28/2022    Lab Results  Component Value Date   TSH 1.490 03/18/2018    BNP    Component Value Date/Time   BNP 552.5 (H) 01/03/2015 1000    ProBNP No results found for: "PROBNP"   Lipid Panel     Component Value Date/Time   CHOL 111 03/18/2018 0907   TRIG 81 03/18/2018 0907   HDL 46 03/18/2018 0907   CHOLHDL 2.4 03/18/2018 0907   CHOLHDL 2.8 04/20/2016 1039   VLDL 21 04/20/2016 1039   LDLCALC 49 03/18/2018 0907     RADIOLOGY: No results found.  IMPRESSION:  1. History of NSTEMI (non-ST elevated myocardial infarction) (HCC)   2. CAD S/P percutaneous coronary angioplasty   3. Essential hypertension   4. Hyperlipidemia LDL goal < 55   5. Hypothyroidism, unspecified type     ASSESSMENT AND PLAN: [name] is a young appearing 83 year-old female who suffered a non-ST segment elevation MI in November 2016 and underwent successful stenting of a subtotally occluded LAD extending proximally into the mid vessel. She developed VT/VF requiring defibrillation in the laboratory.  At the time she was also felt to have a possible ulcerated plaque in the distal ramus immediate vessel, which ultimately cleared with anticoagulation therapy.  Since her initial presentation, she has been free of recurrent anginal symptomatology.  She took aspirin  and Brilinta  90 mg bid for 1 year and ultimately was switched to reduced dose Brilinta  at 60 mg twice a day, which she has tolerated  well.  In the past I had discussed the possibility of switching to clopidogrel but she preferred to stay on Brilinta  since she has been doing exceptionally well on this therapy. Her most recent echo Doppler study from January 2023 showed hyperdynamic LV function with EF 65 to 70%.  There were no wall motion abnormalities.  There was very minimal dilation of left atrium.  Valves were normal.  Over the past several years she has undergone successful left knee replacement surgery in 2022 and in September 2023 underwent hysterectomy without cardiovascular compromise.  In May 2024 she required left wrist surgery following a fall.  Presently, she continues to do well on current therapy.  Blood pressure is stable on amlodipine  5 mg, HCTZ 12.5 mg, lisinopril  30 mg, and metoprolol  tartrate 37.5 mg.  I again discussed possibility of switching her to clopidogrel instead of aspirin /Brilinta  but she has not had any problems with Brilinta  and would prefer to stay on this.  However with some bruisability issues I will discontinue aspirin .  She had repeat laboratory by Dr. Felipe Horton on Jun 08, 2023.  Total cholesterol was 122, HDL 52, LDL 52 and triglycerides 99 on current therapy.  If she has not had LP(a) assessment, this should be done at her next laboratory assessment.  She is aware of my upcoming retirement.  I will transition her to the care of Dr. Randene Bustard for cardiology care and evaluation in 6 to 8 months or sooner as needed.   Millicent Ally, MD, FACC  06/22/2023 4:07 PM

## 2023-06-22 NOTE — Patient Instructions (Signed)
 Medication Instructions:  STOP ASPIRIN   *If you need a refill on your cardiac medications before your next appointment, please call your pharmacy*  Lab Work: NO LABS If you have labs (blood work) drawn today and your tests are completely normal, you will receive your results only by: MyChart Message (if you have MyChart) OR A paper copy in the mail If you have any lab test that is abnormal or we need to change your treatment, we will call you to review the results.  Testing/Procedures: NO TESTING  Follow-Up: At [facility], you and your health needs are our priority.  As part of our continuing mission to provide you with exceptional heart care, our providers are all part of one team.  This team includes your primary Cardiologist (physician) and Advanced Practice Providers or APPs (Physician Assistants and Nurse Practitioners) who all work together to provide you with the care you need, when you need it.  Your next appointment:   6-8 month(s)  Provider:   Randene Bustard, MD

## 2023-06-23 DIAGNOSIS — M25561 Pain in right knee: Secondary | ICD-10-CM | POA: Diagnosis not present

## 2023-07-03 DIAGNOSIS — I10 Essential (primary) hypertension: Secondary | ICD-10-CM | POA: Diagnosis not present

## 2023-07-03 DIAGNOSIS — E782 Mixed hyperlipidemia: Secondary | ICD-10-CM | POA: Diagnosis not present

## 2023-07-03 DIAGNOSIS — E039 Hypothyroidism, unspecified: Secondary | ICD-10-CM | POA: Diagnosis not present

## 2023-07-03 DIAGNOSIS — I2510 Atherosclerotic heart disease of native coronary artery without angina pectoris: Secondary | ICD-10-CM | POA: Diagnosis not present

## 2023-08-02 DIAGNOSIS — E039 Hypothyroidism, unspecified: Secondary | ICD-10-CM | POA: Diagnosis not present

## 2023-08-02 DIAGNOSIS — I10 Essential (primary) hypertension: Secondary | ICD-10-CM | POA: Diagnosis not present

## 2023-08-02 DIAGNOSIS — E782 Mixed hyperlipidemia: Secondary | ICD-10-CM | POA: Diagnosis not present

## 2023-08-02 DIAGNOSIS — I2510 Atherosclerotic heart disease of native coronary artery without angina pectoris: Secondary | ICD-10-CM | POA: Diagnosis not present

## 2023-09-02 DIAGNOSIS — I10 Essential (primary) hypertension: Secondary | ICD-10-CM | POA: Diagnosis not present

## 2023-09-02 DIAGNOSIS — E782 Mixed hyperlipidemia: Secondary | ICD-10-CM | POA: Diagnosis not present

## 2023-09-02 DIAGNOSIS — I2510 Atherosclerotic heart disease of native coronary artery without angina pectoris: Secondary | ICD-10-CM | POA: Diagnosis not present

## 2023-09-02 DIAGNOSIS — E039 Hypothyroidism, unspecified: Secondary | ICD-10-CM | POA: Diagnosis not present

## 2023-09-02 IMAGING — DX DG CHEST 2V
2 series · 2 of 2 positions shown · non-contrast
Comparison: 08/29/2019

CLINICAL DATA: Viral upper respiratory tract infection. Weakness,
shortness of breath, and cough for 2 weeks.

EXAM:
CHEST - 2 VIEW

[dg chest 2 view (1 of 2)]
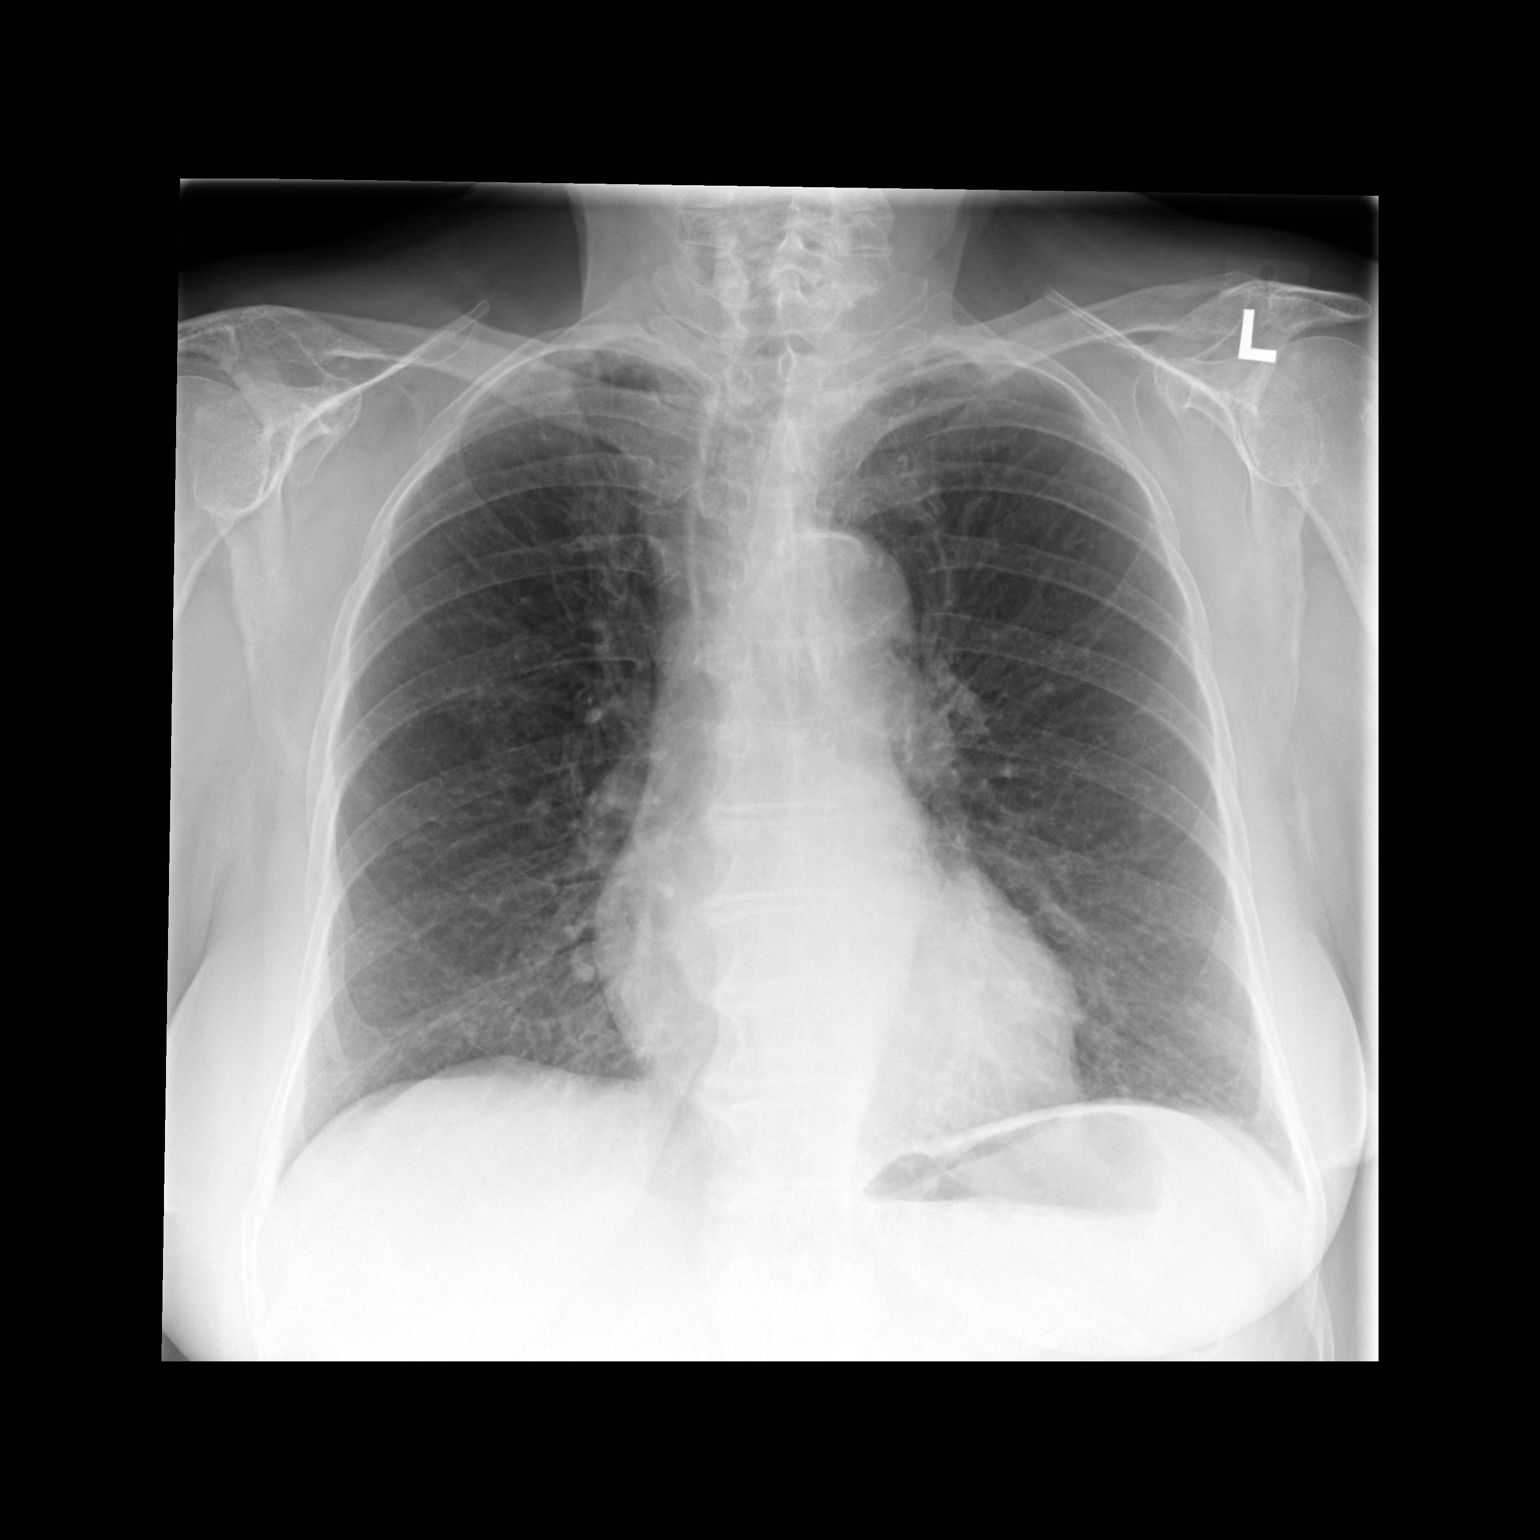

[dg chest 2 view (2 of 2)]
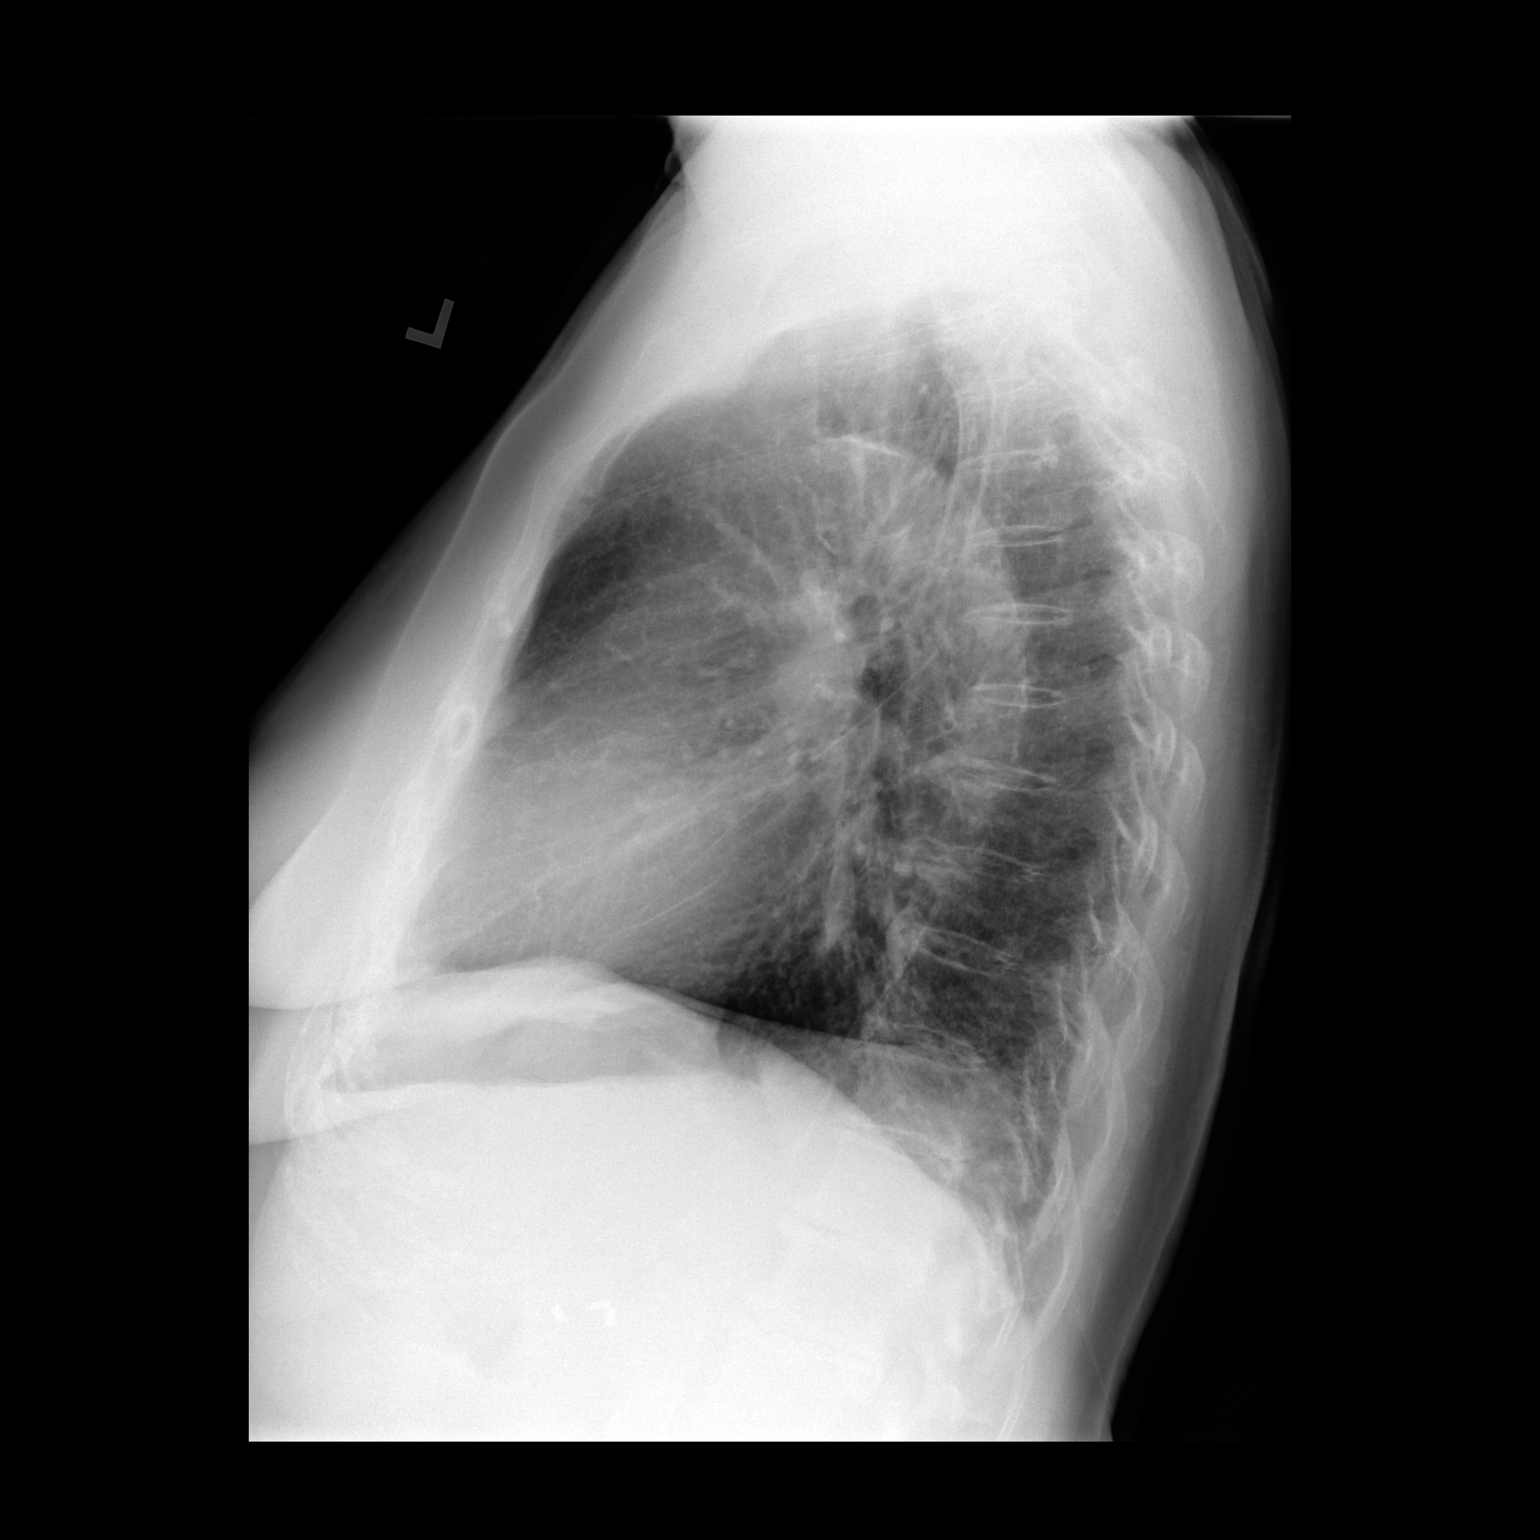

[2 of 2 positions shown; findings below may reference images not displayed]

FINDINGS: Heart size and pulmonary vascularity are normal. Emphysematous
changes and fibrosis in the lungs. No airspace disease or
consolidation. No pleural effusions. No pneumothorax. Mediastinal
contours appear intact. Calcification of the aorta. Degenerative
changes in the spine.
IMPRESSION: No active cardiopulmonary disease.

## 2023-09-10 ENCOUNTER — Telehealth: Payer: Self-pay | Admitting: Cardiovascular Disease

## 2023-09-10 NOTE — Telephone Encounter (Signed)
 Patient calling the office for samples of medication:   1.  What medication and dosage are you requesting samples for?  ticagrelor  (BRILINTA ) 60 MG TABS tablet   2.  Are you currently out of this medication?   Patient stated she is almost out of this medication and wants to get samples.

## 2023-09-10 NOTE — Telephone Encounter (Signed)
 The patient was notified that the office is currently out of Brilinta  samples.

## 2023-09-21 ENCOUNTER — Other Ambulatory Visit: Payer: Self-pay | Admitting: Nurse Practitioner

## 2023-09-21 ENCOUNTER — Telehealth: Payer: Self-pay | Admitting: Cardiovascular Disease

## 2023-09-21 ENCOUNTER — Other Ambulatory Visit (HOSPITAL_COMMUNITY): Payer: Self-pay

## 2023-09-21 MED ORDER — TICAGRELOR 60 MG PO TABS: 60.0000 mg | ORAL_TABLET | Freq: Two times a day (BID) | ORAL | 2 refills | Status: AC

## 2023-09-21 MED ORDER — TICAGRELOR 60 MG PO TABS
60.0000 mg | ORAL_TABLET | Freq: Two times a day (BID) | ORAL | 0 refills | Status: AC
  Filled 2023-09-21: qty 28, 14d supply, fill #0

## 2023-09-21 MED ORDER — TICAGRELOR 60 MG PO TABS: 60.0000 mg | ORAL_TABLET | Freq: Two times a day (BID) | ORAL | 2 refills | Status: DC

## 2023-09-21 NOTE — Telephone Encounter (Signed)
 Patient calling the office for samples of medication:   1.  What medication and dosage are you requesting samples for?  ticagrelor  (BRILINTA ) 60 MG TABS tablet   2.  Are you currently out of this medication?   Patient stated she is completely out of this medication and will need some samples until her prescription comes in from [facility].

## 2023-09-21 NOTE — Telephone Encounter (Signed)
 Medication name/dosage: Brilinta  60 mg BID  Administration instructions: Brilinta  60 mg po BID  Reason for samples: waiting on current supply to come in the mail from mail order pharmacy  Ordering provider: Dr. Kelly/Dr. Anner now to establish with  *Once above information entered, route the phone encounter to CV DIV MAG ST SAMPLES and send Teams message to team member assigned to Samples for the day.

## 2023-09-21 NOTE — Telephone Encounter (Signed)
*  STAT* If patient is at the pharmacy, call can be transferred to refill team.   1. Which medications need to be refilled? (please list name of each medication and dose if known)   ticagrelor  (BRILINTA ) 60 MG TABS tablet   2. Would you like to learn more about the convenience, safety, & potential cost savings by using the [facility]?   3. Are you open to using the [facility] (Type [facility]. ).  4. Which pharmacy/location (including street and city if local pharmacy) is medication to be sent to?  [facility] - [place], [place] - [address]   5. Do they need a 30 day or 90 day supply?   90 day  Patient stated she is completely out of this medication.

## 2023-09-21 NOTE — Telephone Encounter (Signed)
 Returned a phone call to the pt.   She states she was calling into the office to request a refill of Brilinta  60 mg bid to be sent to [facility], and she would like some sort of support without interrupting doses of this medication, while waiting on mail order.   Pt aware we do not carry samples of Brilinta  60 mg anymore, however, I will send in a 2 week supply to our pharmacy downstairs in our building to fill for $21.98.   2 week supply of brilinta  60 mg bid was sent to [facility] with a secure chat sent to that pharmacy team, to fill this medication for a cost of $21.98.  Also sent in her regular 90 supply of this medication to her mail order, [facility].   Pt verbalized understanding and agrees with this plan.

## 2023-09-21 NOTE — Telephone Encounter (Signed)
 Per Chris Pavero, Brilinta  samples are no longer available. Option for pt would be that MD sends RX for 2 wk supply to one of our [facility], and pt would pick it up there.   Per Outpt Pharmacist at [address], cost would be $21.98  for generic.

## 2023-10-01 DIAGNOSIS — I10 Essential (primary) hypertension: Secondary | ICD-10-CM | POA: Diagnosis not present

## 2023-10-03 DIAGNOSIS — I10 Essential (primary) hypertension: Secondary | ICD-10-CM | POA: Diagnosis not present

## 2023-10-03 DIAGNOSIS — E039 Hypothyroidism, unspecified: Secondary | ICD-10-CM | POA: Diagnosis not present

## 2023-10-03 DIAGNOSIS — E782 Mixed hyperlipidemia: Secondary | ICD-10-CM | POA: Diagnosis not present

## 2023-10-03 DIAGNOSIS — I2510 Atherosclerotic heart disease of native coronary artery without angina pectoris: Secondary | ICD-10-CM | POA: Diagnosis not present

## 2023-10-31 DIAGNOSIS — I10 Essential (primary) hypertension: Secondary | ICD-10-CM | POA: Diagnosis not present

## 2023-11-02 DIAGNOSIS — I10 Essential (primary) hypertension: Secondary | ICD-10-CM | POA: Diagnosis not present

## 2023-11-02 DIAGNOSIS — E782 Mixed hyperlipidemia: Secondary | ICD-10-CM | POA: Diagnosis not present

## 2023-11-02 DIAGNOSIS — E039 Hypothyroidism, unspecified: Secondary | ICD-10-CM | POA: Diagnosis not present

## 2023-11-02 DIAGNOSIS — I2510 Atherosclerotic heart disease of native coronary artery without angina pectoris: Secondary | ICD-10-CM | POA: Diagnosis not present

## 2023-11-23 DIAGNOSIS — B078 Other viral warts: Secondary | ICD-10-CM | POA: Diagnosis not present

## 2023-11-23 DIAGNOSIS — C44622 Squamous cell carcinoma of skin of right upper limb, including shoulder: Secondary | ICD-10-CM | POA: Diagnosis not present

## 2023-11-25 DIAGNOSIS — N393 Stress incontinence (female) (male): Secondary | ICD-10-CM | POA: Diagnosis not present

## 2023-11-25 DIAGNOSIS — Z8249 Family history of ischemic heart disease and other diseases of the circulatory system: Secondary | ICD-10-CM | POA: Diagnosis not present

## 2023-11-25 DIAGNOSIS — E669 Obesity, unspecified: Secondary | ICD-10-CM | POA: Diagnosis not present

## 2023-11-25 DIAGNOSIS — E785 Hyperlipidemia, unspecified: Secondary | ICD-10-CM | POA: Diagnosis not present

## 2023-11-25 DIAGNOSIS — M1990 Unspecified osteoarthritis, unspecified site: Secondary | ICD-10-CM | POA: Diagnosis not present

## 2023-11-25 DIAGNOSIS — I2510 Atherosclerotic heart disease of native coronary artery without angina pectoris: Secondary | ICD-10-CM | POA: Diagnosis not present

## 2023-11-25 DIAGNOSIS — I700 Atherosclerosis of aorta: Secondary | ICD-10-CM | POA: Diagnosis not present

## 2023-11-25 DIAGNOSIS — H409 Unspecified glaucoma: Secondary | ICD-10-CM | POA: Diagnosis not present

## 2023-11-25 DIAGNOSIS — E039 Hypothyroidism, unspecified: Secondary | ICD-10-CM | POA: Diagnosis not present

## 2023-11-25 DIAGNOSIS — Z79890 Hormone replacement therapy (postmenopausal): Secondary | ICD-10-CM | POA: Diagnosis not present

## 2023-11-25 DIAGNOSIS — Z7902 Long term (current) use of antithrombotics/antiplatelets: Secondary | ICD-10-CM | POA: Diagnosis not present

## 2023-11-25 DIAGNOSIS — I252 Old myocardial infarction: Secondary | ICD-10-CM | POA: Diagnosis not present

## 2023-11-25 DIAGNOSIS — M8580 Other specified disorders of bone density and structure, unspecified site: Secondary | ICD-10-CM | POA: Diagnosis not present

## 2023-11-25 DIAGNOSIS — I10 Essential (primary) hypertension: Secondary | ICD-10-CM | POA: Diagnosis not present

## 2023-11-25 DIAGNOSIS — Z6830 Body mass index (BMI) 30.0-30.9, adult: Secondary | ICD-10-CM | POA: Diagnosis not present

## 2023-11-29 ENCOUNTER — Telehealth: Payer: Self-pay

## 2023-11-29 ENCOUNTER — Telehealth: Payer: Self-pay | Admitting: Cardiovascular Disease

## 2023-11-29 NOTE — Telephone Encounter (Signed)
 Pt states that she may have to have a shave off by dermatologist due to skin cancer. Pt would like to know if need be is she able to stop Brilinta . Please advise

## 2023-11-29 NOTE — Telephone Encounter (Signed)
 Patient identification verified by 2 forms.   Called and spoke to patient  Patient states:  -Pt has a place on her arm, it was frozen before. It's better but still there.  -Pt has appt tomorrow afternoon, I'm not sure if he will want to cut it or freeze it again. -I know I have had to stop my Brilinta  in the past for a surgery but I'm not sure about this.  -The doctor told me he cut something on someone who was taking Brilinta  and it bleed and bleed and bleed. - Now I'm scared.   Interventions/Plan: -Pt will be a Dr. Anner pt (previously Dr/ Burnard pt).  -Will get message to Dr. Anner and pharmacy staff for advise.   Patient agrees with plan, no questions at this time

## 2023-11-29 NOTE — Telephone Encounter (Signed)
 Call the pt to relay Dr. Genice message. No message, left detailed message on the voicemail.

## 2023-11-29 NOTE — Telephone Encounter (Signed)
 Patient dropped off [employer] paperwork to be filled out.    Placed in Chris's mailbox on 5th floor.  11-29-23  VB

## 2023-11-30 DIAGNOSIS — I10 Essential (primary) hypertension: Secondary | ICD-10-CM | POA: Diagnosis not present

## 2023-12-01 ENCOUNTER — Telehealth: Payer: Self-pay | Admitting: Pharmacy Technician

## 2023-12-01 ENCOUNTER — Other Ambulatory Visit (HOSPITAL_COMMUNITY): Payer: Self-pay

## 2023-12-01 NOTE — Telephone Encounter (Signed)
 We received notice that the patient filled out [employer] brilinta  application but this assistance is going away as of 02/02/24 and they are no longer taking new applicants as of 05/04/23. There is no other assistance is available for brilinta .    Generic brilinta  is 47.00 for 30 days    Clopidogrel would be free for 30 days   prasugrel would be 9.96 for 30 days    I called the patient and she said she doesn't want to change from brilinta . I made her aware the generic does not have assistance either. She said she will call us  if she changes her mind.

## 2023-12-03 DIAGNOSIS — I10 Essential (primary) hypertension: Secondary | ICD-10-CM | POA: Diagnosis not present

## 2023-12-03 DIAGNOSIS — E039 Hypothyroidism, unspecified: Secondary | ICD-10-CM | POA: Diagnosis not present

## 2023-12-03 DIAGNOSIS — I2510 Atherosclerotic heart disease of native coronary artery without angina pectoris: Secondary | ICD-10-CM | POA: Diagnosis not present

## 2023-12-03 DIAGNOSIS — E782 Mixed hyperlipidemia: Secondary | ICD-10-CM | POA: Diagnosis not present

## 2023-12-09 DIAGNOSIS — I10 Essential (primary) hypertension: Secondary | ICD-10-CM | POA: Diagnosis not present

## 2023-12-09 DIAGNOSIS — I2510 Atherosclerotic heart disease of native coronary artery without angina pectoris: Secondary | ICD-10-CM | POA: Diagnosis not present

## 2023-12-09 DIAGNOSIS — E039 Hypothyroidism, unspecified: Secondary | ICD-10-CM | POA: Diagnosis not present

## 2023-12-09 DIAGNOSIS — E782 Mixed hyperlipidemia: Secondary | ICD-10-CM | POA: Diagnosis not present

## 2023-12-30 DIAGNOSIS — I10 Essential (primary) hypertension: Secondary | ICD-10-CM | POA: Diagnosis not present

## 2024-01-02 DIAGNOSIS — E039 Hypothyroidism, unspecified: Secondary | ICD-10-CM | POA: Diagnosis not present

## 2024-01-02 DIAGNOSIS — I2510 Atherosclerotic heart disease of native coronary artery without angina pectoris: Secondary | ICD-10-CM | POA: Diagnosis not present

## 2024-01-02 DIAGNOSIS — I10 Essential (primary) hypertension: Secondary | ICD-10-CM | POA: Diagnosis not present

## 2024-01-02 DIAGNOSIS — E782 Mixed hyperlipidemia: Secondary | ICD-10-CM | POA: Diagnosis not present
